# Patient Record
Sex: Female | Born: 1955 | Race: White | Hispanic: No | Marital: Married | Smoking: Current some day smoker
Health system: Southern US, Community
[De-identification: ages and names within clinical notes are randomized; demographics above are authoritative.]

## PROBLEM LIST (undated history)

## (undated) DIAGNOSIS — R011 Cardiac murmur, unspecified: Secondary | ICD-10-CM

## (undated) DIAGNOSIS — E119 Type 2 diabetes mellitus without complications: Secondary | ICD-10-CM

## (undated) DIAGNOSIS — G4730 Sleep apnea, unspecified: Secondary | ICD-10-CM

## (undated) DIAGNOSIS — M549 Dorsalgia, unspecified: Secondary | ICD-10-CM

## (undated) HISTORY — DX: Sleep apnea, unspecified: G47.30

## (undated) HISTORY — DX: Cardiac murmur, unspecified: R01.1

## (undated) HISTORY — DX: Dorsalgia, unspecified: M54.9

## (undated) HISTORY — DX: Type 2 diabetes mellitus without complications: E11.9

---

## 2000-08-22 ENCOUNTER — Encounter: Admission: RE | Admit: 2000-08-22 | Discharge: 2000-08-22 | Payer: Self-pay | Admitting: Occupational Medicine

## 2000-08-22 ENCOUNTER — Encounter: Payer: Self-pay | Admitting: Occupational Medicine

## 2002-07-29 ENCOUNTER — Other Ambulatory Visit: Admission: RE | Admit: 2002-07-29 | Discharge: 2002-07-29 | Payer: Self-pay | Admitting: *Deleted

## 2002-08-20 ENCOUNTER — Ambulatory Visit (HOSPITAL_COMMUNITY): Admission: RE | Admit: 2002-08-20 | Discharge: 2002-08-20 | Payer: Self-pay | Admitting: *Deleted

## 2002-08-20 ENCOUNTER — Encounter (INDEPENDENT_AMBULATORY_CARE_PROVIDER_SITE_OTHER): Payer: Self-pay

## 2002-09-09 ENCOUNTER — Emergency Department (HOSPITAL_COMMUNITY): Admission: EM | Admit: 2002-09-09 | Discharge: 2002-09-09 | Payer: Self-pay | Admitting: Emergency Medicine

## 2002-09-09 ENCOUNTER — Encounter: Payer: Self-pay | Admitting: Emergency Medicine

## 2002-09-10 ENCOUNTER — Encounter: Payer: Self-pay | Admitting: Specialist

## 2002-09-10 ENCOUNTER — Ambulatory Visit (HOSPITAL_COMMUNITY): Admission: RE | Admit: 2002-09-10 | Discharge: 2002-09-10 | Payer: Self-pay | Admitting: Specialist

## 2003-09-07 ENCOUNTER — Other Ambulatory Visit: Admission: RE | Admit: 2003-09-07 | Discharge: 2003-09-07 | Payer: Self-pay | Admitting: *Deleted

## 2004-08-31 ENCOUNTER — Ambulatory Visit (HOSPITAL_BASED_OUTPATIENT_CLINIC_OR_DEPARTMENT_OTHER): Admission: RE | Admit: 2004-08-31 | Discharge: 2004-08-31 | Payer: Self-pay | Admitting: *Deleted

## 2008-03-07 ENCOUNTER — Emergency Department (HOSPITAL_COMMUNITY): Admission: EM | Admit: 2008-03-07 | Discharge: 2008-03-07 | Payer: Self-pay | Admitting: Emergency Medicine

## 2009-06-09 ENCOUNTER — Encounter: Admission: RE | Admit: 2009-06-09 | Discharge: 2009-06-09 | Payer: Self-pay | Admitting: Neurological Surgery

## 2011-04-12 NOTE — Procedures (Signed)
NAME:  Meagan Moran, Meagan Moran               ACCOUNT NO.:  [id number]   MEDICAL RECORD NO.:  [id number]          PATIENT TYPE:  OUT   LOCATION:  SLEEP CENTER                 FACILITY:  [facility]   PHYSICIAN:  Clinton D. Maple Hudson, M.D. DATE OF BIRTH:  September 11, 1956   DATE OF STUDY:  08/31/2004                              NOCTURNAL POLYSOMNOGRAM   STUDY DATE:  August 31, 2004   REFERRING PHYSICIAN:  Ronnette Juniper. Roseanne Reno, M.D.   INDICATION FOR STUDY:  Hypersomnia with sleep apnea.   EPWORTH SLEEPINESS SCORE:  7/24   NECK SIZE:  17 inches   BODY MASS INDEX:  35   WEIGHT:  260 pounds   MEDICATIONS:  Hyzaar, glipizide, Singulair, Protonix, Norvasc, tramadol,  Xanax, Zoloft, hydrocodone or Percocet, tizanidine.   SLEEP ARCHITECTURE:  Total sleep time 404 minutes with sleep efficiency 84%,  stage I was 11%, stage II was 73%, stages III and IV were absent, REM was  15% of total sleep time, latency to sleep onset 28 minutes, latency to REM  259 minutes, awake after sleep onset 38 minutes, arousal index 27 which was  increased and reflected respiratory events, leg movements, and a significant  number of nonspecific arousals.   RESPIRATORY DATA:  NPSG protocol.  RDI 23.6/hr indicating moderately severe  obstructive sleep apnea/hypopnea syndrome.  This reflected 34 obstructive  apneas, 125 hypopneas.  Events were not positional.  REM RDI was 61/hr.   OXYGEN DATA:  Loud snoring with oxygen desaturation during apneas to a nadir  of 83%.  Mean oxygen saturation through the study was 94% on room air.   CARDIAC DATA:  Normal cardiac rhythm.   MOVEMENT/PARASOMNIA:  Bathroom visit x1.  A total of 44 limb jerks were  recorded of which 11 were associated with arousal or awakening for a  periodic limb movement with arousal index of 1.3/hr which is probably not  clinically significant.   IMPRESSION/RECOMMENDATION:  Moderate obstructive sleep apnea/hypopnea  syndrome with respiratory disturbance index 23.6/hr and  oxygen desaturation  to 83%.  Consider return for continuous positive airway pressure titration  or evaluation for alternative therapies.      CDY/MEDQ  D:  09/09/2004 09:16:12  T:  09/09/2004 18:14:08  Job:  [id number]

## 2011-04-12 NOTE — Op Note (Signed)
   NAME:  Meagan Moran, BOEHNING                         ACCOUNT NO.:  [id number]   MEDICAL RECORD NO.:  [id number]                   PATIENT TYPE:  AMB   LOCATION:  SDC                                  FACILITY:  [facility]   PHYSICIAN:  Georgina Peer, M.D.              DATE OF BIRTH:  22-Jun-1956   DATE OF PROCEDURE:  08/20/2002  DATE OF DISCHARGE:                                 OPERATIVE REPORT   PREOPERATIVE DIAGNOSES:  Menorrhagia.   POSTOPERATIVE DIAGNOSES:  Menorrhagia.   OPERATION PERFORMED:  Hysteroscopy, dilatation and curettage.   SURGEON:  Georgina Peer, M.D.   ANESTHESIA:  Monitored anesthesia care using IV sedation and 15 cc  paracervical block using 1% Xylocaine plain.   ESTIMATED BLOOD LOSS:  25 cc.   COMPLICATIONS:  None.   FINDINGS:  Thickened endometrium.  No intracavitary mass or lesions.   INDICATIONS:  A 55 year old female with heavy vaginal bleeding for the last  three weeks, suggestion of a filling defect on saline infusion sonogram with  heavy bleeding.  The patient was brought in for hysteroscopy and possible  resectoscope, possible D&C.   DESCRIPTION:  The patient was taken to the operating room, given IV  sedation, placed in the dorsal lithotomy position.  Perineum, vagina,  periurethral area prepped and draped sterilely.  A catheter was used to  empty the bladder.  Cervix was visualized with a speculum and a paracervical  block using 15 cc of 1% Xylocaine plain was injected and infiltrated into  the cervix.  A tenaculum grasped the anterior lip of the cervix.  The cervix  was sounded to 8 cm and progressively dilated to 23 [place].  A diagnostic  hysteroscope was used to visualize the cervix and endocervix which was  normal.  There was lush endometrium with prominent vasculature in the  endometrial cavity.  There were no filling defects, polyps, fibroids, or  synechiae noted.  A sharp curettage was performed and the tissue sent to  pathology.   Hysteroscopic visualization after this curettage revealed a much  cleaner endometrial cavity with the vasculature being removed.  Tissue was  sent for pathology.  Sponge, needle, and instrument counts were correct.  The patient received Toradol IV before returning to recovery area.                                               Georgina Peer, M.D.    JPN/MEDQ  D:  08/20/2002  T:  08/20/2002  Job:  [id number]

## 2014-02-01 ENCOUNTER — Other Ambulatory Visit (HOSPITAL_COMMUNITY): Payer: Self-pay | Admitting: Emergency Medicine

## 2014-02-01 ENCOUNTER — Ambulatory Visit (HOSPITAL_COMMUNITY)
Admission: RE | Admit: 2014-02-01 | Discharge: 2014-02-01 | Disposition: A | Discharge status: Home / Self Care | Payer: BC Managed Care – PPO | Source: Ambulatory Visit | Attending: Emergency Medicine | Admitting: Emergency Medicine

## 2014-02-01 DIAGNOSIS — R52 Pain, unspecified: Secondary | ICD-10-CM

## 2014-02-01 DIAGNOSIS — Z975 Presence of (intrauterine) contraceptive device: Secondary | ICD-10-CM | POA: Insufficient documentation

## 2014-02-01 DIAGNOSIS — M4120 Other idiopathic scoliosis, site unspecified: Secondary | ICD-10-CM | POA: Insufficient documentation

## 2014-02-01 DIAGNOSIS — IMO0001 Reserved for inherently not codable concepts without codable children: Secondary | ICD-10-CM | POA: Insufficient documentation

## 2014-02-01 DIAGNOSIS — M51379 Other intervertebral disc degeneration, lumbosacral region without mention of lumbar back pain or lower extremity pain: Secondary | ICD-10-CM | POA: Insufficient documentation

## 2014-02-01 DIAGNOSIS — M79609 Pain in unspecified limb: Secondary | ICD-10-CM | POA: Insufficient documentation

## 2014-02-01 DIAGNOSIS — M545 Low back pain, unspecified: Secondary | ICD-10-CM | POA: Insufficient documentation

## 2014-02-01 DIAGNOSIS — W19XXXA Unspecified fall, initial encounter: Secondary | ICD-10-CM | POA: Insufficient documentation

## 2014-02-01 DIAGNOSIS — M5137 Other intervertebral disc degeneration, lumbosacral region: Secondary | ICD-10-CM | POA: Insufficient documentation

## 2016-08-21 ENCOUNTER — Other Ambulatory Visit: Payer: Self-pay | Admitting: Family Medicine

## 2016-08-21 ENCOUNTER — Ambulatory Visit
Admission: RE | Admit: 2016-08-21 | Discharge: 2016-08-21 | Disposition: A | Discharge status: Home / Self Care | Payer: BC Managed Care – PPO | Source: Ambulatory Visit | Attending: Family Medicine | Admitting: Family Medicine

## 2016-08-21 DIAGNOSIS — Z01818 Encounter for other preprocedural examination: Secondary | ICD-10-CM

## 2019-06-22 ENCOUNTER — Encounter: Payer: Self-pay | Admitting: Psychiatry

## 2019-06-22 ENCOUNTER — Other Ambulatory Visit: Payer: Self-pay

## 2019-06-22 ENCOUNTER — Ambulatory Visit (INDEPENDENT_AMBULATORY_CARE_PROVIDER_SITE_OTHER): Payer: BC Managed Care – PPO | Admitting: Psychiatry

## 2019-06-22 VITALS — BP 180/100 | HR 78 | Ht 74.0 in | Wt 260.0 lb

## 2019-06-22 DIAGNOSIS — F39 Unspecified mood [affective] disorder: Secondary | ICD-10-CM

## 2019-06-22 DIAGNOSIS — F411 Generalized anxiety disorder: Secondary | ICD-10-CM | POA: Diagnosis not present

## 2019-06-22 MED ORDER — OXCARBAZEPINE 300 MG PO TABS: 300.0000 mg | ORAL_TABLET | Freq: Every day | ORAL | 1 refills | Status: DC

## 2019-06-22 MED ORDER — DULOXETINE HCL 30 MG PO CPEP: ORAL_CAPSULE | ORAL | 0 refills | Status: DC

## 2019-06-22 MED ORDER — DULOXETINE HCL 60 MG PO CPEP: 60.0000 mg | ORAL_CAPSULE | Freq: Every day | ORAL | 0 refills | Status: DC

## 2019-06-22 NOTE — Progress Notes (Signed)
[facility] MD/PA/NP Initial Note  06/24/2019 1:14 PM Meagan Moran Viele  MRN:  [id number]  Chief Complaint:  Chief Complaint    Anxiety; Depression      HPI: Pt is a 63 yo female seen for initial evaluation for anxiety. She is accompanied by her husband who is present for the initial portion of the exam, leaves during the exam at the request of patient, and is called back in by pt for conclusion of exam. Pt reports that she has seen Meagan MinksLeslie Brewington, NP in the past for psychiatric medication management and they would like to transfer her care to this practice She reports that she would also like to see a therapist in this practice since her previous therapist, Meagan Moran, retired.  Patient reports that she has agreed to seek treatment at the insistence of her husband since she feels this would be beneficial/necessary with maintaining their marriage.  Husband reports that pt has severe anxiety and is having confusion. He reports that she is having difficulty functioning and is not cooking and doing her usual acitvities and "literally walks in circles." Husband reports that her thought process is disorganized at times.   Patient reports that she feels the best in the morning. She reports that her anxiety worsens as the day progresses. She reports that she has frequent worry, rumination, and catastrophic thinking. Notices GI s/s with anxiety. Denies any h/o panic attacks. Denies social anxiety. Reports some checking behaviors. Denies rituals or routines. Reports obsessive thoughts about finances. Husband reports that pt frequently obsesses about where the money will come from or if they will have enough money to cover things  Describes mood as "a little depressed, aggravated, angry." Reports that she has lost close to 50 lbs in the last few months. Her husband reports that pt barely eats. She reports that is is difficult for her to chew due to poor dentition. Appetite is low. Reports adequate sleep  and estimates sleeping 6-7 hours a night. Husband reports that she naps 3-4 hours daily. Reports that her energy is better in the morning. Motivation is good. She reports poor concentration. They both report that she has difficulty completing tasks and is distractible. Denies SI. Marland Kitchen. She reports that her thoughts will race. They deny any impulsivity.   Reports that her mood may have been elevated at times in the past when her children were younger and still at home. She reports 2-3 day periods of 3-4 hours of sleep a night. Reports that she had racing thoughts then. 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   Denies AH, VH, or paranoia.  Past Psychiatric Medication Trials: Cymbalta- Effective for anxiety at 60 mg po qd.  Prozac- side effects Wellbutrin- side effects Trileptal- Effective. Caused nightmares. Was taking 300 mg po BID.   Visit Diagnosis:    ICD-10-CM   1. Generalized anxiety disorder  F41.1 DULoxetine (CYMBALTA) 30 MG capsule    Oxcarbazepine (TRILEPTAL) 300 MG tablet    DULoxetine (CYMBALTA) 60 MG capsule  2. Episodic mood disorder (HCC)  F39 Oxcarbazepine (TRILEPTAL) 300 MG tablet    Past Psychiatric History: Saw Meagan MinksLeslie Brewington, NP in the past and Meagan Moran for therapy until she retired. Denies any other past psychiatric tx to include hospitalization.   Past Medical History:  Past Medical History:  Diagnosis Date  . Back pain   . Diabetes mellitus, type II (HCC)   . Heart murmur   .  Sleep apnea     Past Surgical History:  Procedure Laterality Date  . CESAREAN SECTION      Family History:  Family History  Problem Relation Age of Onset  . Anxiety disorder Mother   . Anxiety disorder Maternal Grandmother   . Anxiety disorder Daughter     Social History:  [place]. 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 Reports that she  enjoyed her work overall and there were some work stressors. Husband reports that pt was pushed into early retirement for not meeting productivity expectations. Enjoys working in her garden. Married x 37 years and reports that they are having marital stress. Husband is disabled. Daughter is in the [employer] and is at [employer]. Daughter is married to a woman. [place] in [place] who works from home and had 4 children with one woman. Reports son is now in another relationship. Father died in 2012. Has always enjoyed cooking. Reports increased marital conflict for the last 9 months. Reports that husband is currently controlling finances.  Patient indicates that her husband also controls which medications she does and does not take and this affects compliance with prescribed insulin regimen. Reports that her children are supportive.    Social History   Socioeconomic History  . Marital status: Married    Spouse name: Not on file  . Number of children: Not on file  . Years of education: Not on file  . Highest education level: Not on file  Occupational History  . Not on file  Social Needs  . Financial resource strain: Not on file  . Food insecurity    Worry: Not on file    Inability: Not on file  . Transportation needs    Medical: Not on file    Non-medical: Not on file  Tobacco Use  . Smoking status: Current Every Day Smoker  . Smokeless tobacco: Never Used  Substance and Sexual Activity  . Alcohol use: Not Currently  . Drug use: Not Currently  . Sexual activity: Not on file  Lifestyle  . Physical activity    Days per week: Not on file    Minutes per session: Not on file  . Stress: Not on file  Relationships  . Social Musicianconnections    Talks on phone: Not on file    Gets together: Not on file    Attends religious service: Not on file    Active member of club or organization: Not on file    Attends meetings of clubs or organizations: Not on file    Relationship status: Not on file   Other Topics Concern  . Not on file  Social History Narrative  . Not on file    Allergies:  Allergies  Allergen Reactions  . Nsaids   . Sulfa Antibiotics     Metabolic Disorder Labs: No results found for: HGBA1C, MPG No results found for: PROLACTIN No results found for: CHOL, TRIG, HDL, CHOLHDL, VLDL, LDLCALC No results found for: TSH  Therapeutic Level Labs: No results found for: LITHIUM No results found for: VALPROATE No components found for:  CBMZ  Current Medications: Current Outpatient Medications  Medication Sig Dispense Refill  . ALPRAZolam (XANAX) 0.5 MG tablet TAKE 1 TABLET EVERY 12 HOURS AS NEEDED FOR ANXIETY    . amLODipine (NORVASC) 5 MG tablet TAKE 1 TABLET EVERY DAY AT LUNCH    . hydrochlorothiazide (MICROZIDE) 12.5 MG capsule Take 12.5 mg  by mouth daily as needed.    Marland Kitchen. HYDROcodone-acetaminophen (NORCO) 7.5-325 MG tablet     . losartan (COZAAR) 100 MG tablet Take 100 mg by mouth daily.    . Oxcarbazepine (TRILEPTAL) 300 MG tablet Take 1 tablet (300 mg total) by mouth at bedtime. 30 tablet 1  . tiZANidine (ZANAFLEX) 4 MG tablet TAKE 1 TABLET 4 TIMES A DAY AS NEEDED FOR MUSCLE SPASMS    . DULoxetine (CYMBALTA) 30 MG capsule Take 1 capsule po q am x 1 week, then 2 capsules po q am 30 capsule 0  . [START ON 07/06/2019] DULoxetine (CYMBALTA) 60 MG capsule Take 1 capsule (60 mg total) by mouth daily. 30 capsule 0   No current facility-administered medications for this visit.     Medication Side Effects: none  Orders placed this visit:  No orders of the defined types were placed in this encounter.   Psychiatric Specialty Exam:  Review of Systems  Constitutional: Positive for malaise/fatigue and weight loss.  HENT: Positive for tinnitus.   Eyes: Negative.   Respiratory: Positive for wheezing.   Cardiovascular: Negative.   Gastrointestinal: Positive for constipation, diarrhea and nausea.  Genitourinary: Positive for dysuria, frequency and urgency.   Musculoskeletal: Positive for back pain and joint pain.  Skin: Positive for itching and rash.  Neurological: Positive for dizziness, tingling, weakness and headaches.  Endo/Heme/Allergies: Positive for polydipsia.  Psychiatric/Behavioral:       Refer to HPI    Blood pressure (!) 180/100, pulse 78, height 6\' 2"  (1.88 m), weight 260 lb (117.9 kg).Body mass index is 33.38 kg/m.  General Appearance: Casual  Eye Contact:  Fair  Speech:  Talkative  Volume:  Normal  Mood:  Anxious and Dysphoric  Affect:  Anxious  Thought Process:  Descriptions of Associations: Circumstantial  Orientation:  Full (Time, Place, and Person)  Thought Content: Rumination   Suicidal Thoughts:  No  Homicidal Thoughts:  No  Memory:  Recent;   Fair  Judgement:  Fair  Insight:  Fair  Psychomotor Activity:  Normal  Concentration:  Concentration: Poor  Recall:  Fair  Fund of Knowledge: Good  Language: Good  Assets:  Communication Skills Resilience Vocational/Educational  ADL's:  Intact  Cognition: WNL  Prognosis:  Fair   Pt presents as disengaged when husband is present and more organized and engaged when husband is not present.   Receiving Psychotherapy: No   Treatment Plan/Recommendations: Patient seen for 60 minutes and greater than 50% of visit spent counseling patient and her husband and coordination of care, to include requesting records from previous psychiatric provider and assisting patient with scheduling appointment to see Jim DesanctisAndy Mitcham, PhD.  Also counseled patient and her husband regarding current anxiety signs and symptoms and potential benefits, risks, and side effects of possible treatment options.  Discussed resuming Cymbalta since patient reports that this has been helpful for her mood and anxiety in the past.  Discussed that Cymbalta may also be helpful for chronic pain.  Will restart Cymbalta 30 mg p.o. every morning for 1 week, then increase to 60 mg p.o. every morning for anxiety and mood.   Patient also reports that Trileptal has been helpful for her mood and anxiety.  Husband reports concerns that patient seem to have some cognitive side effects when taken during the day and that higher doses have been associated with nightmares.  Discussed taking 300 mg of Trileptal at at bedtime only to determine if patient can achieve efficacy at this dose without significant tolerability  issues.  Discussed that Trileptal may also improve racing thoughts.  Discussed potential benefits of therapy and patient is amenable to individual therapy and husband reports that he would consider marital counseling if needed.  Patient to follow-up with this provider in 4 to 6 weeks or sooner if clinically indicated. Patient advised to contact office with any questions, adverse effects, or acute worsening in signs and symptoms.     Thayer Headings, PMHNP

## 2019-06-24 ENCOUNTER — Other Ambulatory Visit: Payer: Self-pay | Admitting: Neurosurgery

## 2019-06-24 DIAGNOSIS — M5136 Other intervertebral disc degeneration, lumbar region: Secondary | ICD-10-CM

## 2019-06-28 ENCOUNTER — Ambulatory Visit (INDEPENDENT_AMBULATORY_CARE_PROVIDER_SITE_OTHER): Payer: BC Managed Care – PPO | Admitting: Psychiatry

## 2019-06-28 ENCOUNTER — Other Ambulatory Visit: Payer: Self-pay

## 2019-06-28 DIAGNOSIS — F322 Major depressive disorder, single episode, severe without psychotic features: Secondary | ICD-10-CM

## 2019-06-28 DIAGNOSIS — G4700 Insomnia, unspecified: Secondary | ICD-10-CM | POA: Diagnosis not present

## 2019-06-28 DIAGNOSIS — F411 Generalized anxiety disorder: Secondary | ICD-10-CM | POA: Diagnosis not present

## 2019-06-28 DIAGNOSIS — Z630 Problems in relationship with spouse or partner: Secondary | ICD-10-CM | POA: Diagnosis not present
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
## 2019-06-30 ENCOUNTER — Other Ambulatory Visit: Payer: Self-pay | Admitting: Psychiatry

## 2019-06-30 DIAGNOSIS — F411 Generalized anxiety disorder: Secondary | ICD-10-CM

## 2019-06-30 NOTE — Telephone Encounter (Signed)
Call pharmacy

## 2019-07-01 NOTE — Telephone Encounter (Signed)
Meagan Moran, can you call pharmacy to verify what her last refill was for? After the 30 mg she is to move up to 60 mg.

## 2019-07-06 ENCOUNTER — Other Ambulatory Visit: Payer: Self-pay

## 2019-07-06 ENCOUNTER — Ambulatory Visit (INDEPENDENT_AMBULATORY_CARE_PROVIDER_SITE_OTHER): Payer: Self-pay | Admitting: Psychiatry

## 2019-07-06 DIAGNOSIS — F411 Generalized anxiety disorder: Secondary | ICD-10-CM

## 2019-07-06 DIAGNOSIS — F322 Major depressive disorder, single episode, severe without psychotic features: Secondary | ICD-10-CM

## 2019-07-09 ENCOUNTER — Other Ambulatory Visit: Payer: Self-pay

## 2019-07-09 ENCOUNTER — Ambulatory Visit (INDEPENDENT_AMBULATORY_CARE_PROVIDER_SITE_OTHER): Payer: BC Managed Care – PPO | Admitting: Psychiatry

## 2019-07-09 DIAGNOSIS — G4733 Obstructive sleep apnea (adult) (pediatric): Secondary | ICD-10-CM | POA: Diagnosis not present

## 2019-07-09 DIAGNOSIS — F39 Unspecified mood [affective] disorder: Secondary | ICD-10-CM | POA: Diagnosis not present

## 2019-07-09 DIAGNOSIS — F411 Generalized anxiety disorder: Secondary | ICD-10-CM

## 2019-07-09 NOTE — Progress Notes (Signed)
Psychotherapy Progress Note [facility] [name], PhD LP  Patient ID: Meagan Moran     MRN: [id number]     Therapy format: Individual psychotherapy Date: 07/09/2019     Start: 8:14a Stop: 9:02a Time Spent: 48 min Location: telehealth   Telehealth visit -- I connected with this patient by an approved telecommunication method (audio only), with her informed consent, and verifying identity and patient privacy.  I was located at my office and patient at her home.  As needed, we discussed the limitations, risks, and security and privacy concerns associated with telehealth service, including the availability and conditions which currently govern in-person appointments and the possibility that 3rd-party payment may not be fully guaranteed and she may be responsible for charges.  After she indicated understanding, we proceeded with the session.  Also discussed treatment planning, as needed, including ongoing verbal agreement with the plan, the opportunity to ask and answer all questions, her demonstrated understanding of instructions, and her readiness to call the office should symptoms worsen or she feels she is in a crisis state and needs more immediate and tangible assistance.  Session narrative (presenting needs, interim history, self-report of stressors and symptoms, applications of prior therapy, status changes, and interventions made in session) C/o feeling fatigable, variable DFA, AM digestive distress.  Probed sleep status -- uses CPAP, believes it to be clean and well-adjusted, but has not had consultation for a long time.  Uses Colace, walking for constipation sxs but anxious about using too much Colace.  Assured stool softener should be plenty safe.  Acknowledges some use of hydrocodone/APAP QAM, sometimes, no more than twice a day, never over her allotment.  Possibility opioid slows bowel.  Also on tizanidine for muscle spasms.  Discussed measures she can take for  regularity.  Interpreted need to safeguard her sleep -- can be bothered by phone messages and certainly by worry and rumination once quiet.  Continues to feel lost, as the former breadwinner who lost her job.  Admits great anger at herself, ostensibly for failing.  Challenged view of failure, reframed as experiencing the cumulative effects of hard-driving a long time, trying not to stop for her body's needs, committing herself rather mercilessly to being strong for [name] and doing everything.  Admits internally yelling at herself.  Pointed out particular need for humane self-talk and challenged to notice the double standard between talking to herself and talking to any loved one, e.g., daughter.  Tearfully acknowledges the problem.  Daughter (Meagan Moran), captain in [place], just had hip surgery, has a wife of her own taking care of her, and out of commission herself for some time in recovery.  She gets bitter sometimes, similarly.  Challenged PT to consider what she would, and does, tell Meagan Moran.  Modeled and coached in humane self-talk, articulating "the reverse golden rule" of being as good to oneself as  You already know how to be to others.  Discussed tactics for recognizing and rewriting harsh automatic thoughts.  Refreshed/supported in understanding also that her husband's gruff ways credibly cover a strong and soft love for her, that his temptation to irritability is more than anything about him trying to fight off her internal harsh voice without seeming to fight PT herself.  Assured he is settling, and warming, and that their discrepant tastes for climate at night are the main obstacle to sharing a bedroom.  Therapeutic modalities: Cognitive Behavioral Therapy, Solution-Oriented/Positive Psychology and Meagan Moran-senstive  Mental Status/Observations:  Appearance:   Not assessed  Behavior:  Appropriate  Motor:  Not assessed  Speech/Language:   Clear and Coherent  Affect:  Not assessed, tearful per audio   Mood:  anxious, dysthymic and irritable  Thought process:  normal  Thought content:    Rumination  Sensory/Perceptual disturbances:    WNL  Orientation:  grossly intact  Attention:  Good  Concentration:  Good  Memory:  grossly intact  Insight:    Fair  Judgment:   Fair  Impulse Control:  Fair   Risk Assessment: Danger to Self: No Self-injurious Behavior: No Danger to Others: No Physical Aggression / Violence: No Duty to Warn: No Access to Firearms a concern: No  Assessment of progress:  progressing  Diagnosis:    ICD-10-CM   1. Generalized anxiety disorder  F41.1   2. Episodic mood disorder (HCC)  F39   3. Obstructive sleep apnea syndrome  G47.33    Plan:  . Continue using walking and/or calming breathing ad lib for anxiety . Apply "reverse golden rule" ad lib . For awareness and constructive self-talk -- journal at least 2/day what's going on, how she feels, and any self-talk she can identify; as able, rewrite the negatives, using the key to encourage herself the way she would her daughter . For sleep -- practice full, honest, conscious permission to let down and sleep well for the night.  Resolve no demand to get up unless it requires CPR or a Government social research officerfire extinguisher.  Consider putting phone on DND setting during the night. . For digestion -- resolved to get yogurt with active cultures, uphold water and fiber intake, walk/stretch as able . Other recommendations/advice as noted above . Continue to utilize previously learned skills ad lib . Maintain medication as prescribed and work faithfully with relevant prescriber(s) if any changes are desired or seem indicated . Call the clinic on-call service, present to ER, or call 911 if any life-threatening psychiatric crisis Return in about 1 week (around 07/16/2019).  Robley Friesobert Terris Bodin, PhD Marliss CzarAndy Joleena Weisenburger, PhD LP Clinical Psychologist, [facility] [address], [address] [place], [place] [address] [phone](o) [phone]

## 2019-07-10 ENCOUNTER — Ambulatory Visit
Admission: RE | Admit: 2019-07-10 | Discharge: 2019-07-10 | Disposition: A | Discharge status: Home / Self Care | Payer: BC Managed Care – PPO | Source: Ambulatory Visit | Attending: Neurosurgery | Admitting: Neurosurgery

## 2019-07-10 ENCOUNTER — Other Ambulatory Visit: Payer: Self-pay

## 2019-07-10 DIAGNOSIS — M5136 Other intervertebral disc degeneration, lumbar region: Secondary | ICD-10-CM

## 2019-07-13 ENCOUNTER — Other Ambulatory Visit: Payer: Self-pay

## 2019-07-13 ENCOUNTER — Ambulatory Visit (INDEPENDENT_AMBULATORY_CARE_PROVIDER_SITE_OTHER): Payer: BC Managed Care – PPO | Admitting: Psychiatry

## 2019-07-13 DIAGNOSIS — G4733 Obstructive sleep apnea (adult) (pediatric): Secondary | ICD-10-CM | POA: Diagnosis not present

## 2019-07-13 DIAGNOSIS — F322 Major depressive disorder, single episode, severe without psychotic features: Secondary | ICD-10-CM | POA: Diagnosis not present

## 2019-07-13 DIAGNOSIS — Z630 Problems in relationship with spouse or partner: Secondary | ICD-10-CM

## 2019-07-13 DIAGNOSIS — F411 Generalized anxiety disorder: Secondary | ICD-10-CM | POA: Diagnosis not present

## 2019-07-13 NOTE — Progress Notes (Signed)
Psychotherapy Progress Note [facility] [name], PhD LP  Patient ID: Meagan Moran     MRN: [id number]     Therapy format: Individual psychotherapy Date: 07/13/2019     Start: 8:18a Stop: 9:03a Time Spent: 45 min Location: telehealth   Telehealth visit -- I connected with this patient by an approved telecommunication method (audio only), with her informed consent, and verifying identity and patient privacy.  I was located at my office and patient at her home.  As needed, we discussed the limitations, risks, and security and privacy concerns associated with telehealth service, including the availability and conditions which currently govern in-person appointments and the possibility that 3rd-party payment may not be fully guaranteed and she may be responsible for charges.  After she indicated understanding, we proceeded with the session.  Also discussed treatment planning, as needed, including ongoing verbal agreement with the plan, the opportunity to ask and answer all questions, her demonstrated understanding of instructions, and her readiness to call the office should symptoms worsen or she feels she is in a crisis state and needs more immediate and tangible assistance.  Session narrative (presenting needs, interim history, self-report of stressors and symptoms, applications of prior therapy, status changes, and interventions made in session) Husband moved back in the bedroom, then back out.  Feels guilty, fears she is pushing him to divorce.  Says either he or she have torn up her journal notes because she is "doing them wrong".  Guilty all over the place, screwing up food prep, apparently trying to do everything perfectly and failing, mercilessly second-guessing and fearing disapproval/disappointment.    Notes her blood sugars are not controlled, evidence being that they go as low as 64, as high as 150-160.  Assured that a number of diabetics have told me about periods in  the 200s without their doctors being alarmed, per se, main risk sounds to be too-low b.s. precipitating anxiety, foggyheadedness, or LOC if worse.  Plans to switch endocrinologists so she can see a doctor  in-person.  Usual regimen includes PRN insulin if it goes high, but she believes [name] tells her not to take it when sugars are high.  Seems to be some confusion about what is sound advice, possible overinterpretation or lack of explanation between PT and husband.  Noted that poorly controlled b.s. will aggravate emotional sxs and discussed practical diabetes control, if jittery and registers low b.s., a little juice and see how she feels in 10 minutes.  Has cranberry juice on hand in 7 oz. bottles but inconveniently diuretic.    Stomach upset (bowel retention) daily.  MRI lower back, awaiting results, worries.   Is taking meds as directed, no NMs, better quality of sleep. Using CPAP.  Recommendations made and written down at home by PT (below), including prayer forms consistent with her faith.  PT says she does not have decent supply of paper at home but feels guilty buying a journal because of money.  Note made on envelopes handy and reportedly put on refrigerator with request for [name] to wake her when he get up.  PT intends to come in-person next visit -- better quality of interaction and healthy change of scenery from home-based worry/rumination habits.  H remains welcome.  Therapeutic modalities: Cognitive Behavioral Therapy, Solution-Oriented/Positive Psychology, Ego-Supportive and Psycho-education/Bibliotherapy  Mental Status/Observations:  Appearance:   Not assessed     Behavior:  Agitated  Motor:  Not assessed  Speech/Language:   Clear and Coherent  Affect:  Not assessed  Mood:  anxious and depressed  Thought process:  flight of ideas and somewhat  Thought content:    multiple worries  Sensory/Perceptual disturbances:    WNL  Orientation:  grossly intact  Attention:  Good   Concentration:  Fair  Memory:  grossly intact, not assessed  Insight:    Fair  Judgment:   Fair  Impulse Control:  Fair   Risk Assessment: Danger to Self: No Self-injurious Behavior: No Danger to Others: No Physical Aggression / Violence: No Duty to Warn: No Access to Firearms a concern: No  Assessment of progress:  progressing  Diagnosis:   ICD-10-CM   1. Generalized anxiety disorder  F41.1   2. Current severe episode of major depressive disorder without psychotic features, unspecified whether recurrent (HCC)  F32.2   3. Obstructive sleep apnea syndrome  G47.33   4. Relationship problem between partners  Z63.0    Plan:  . OK to use stool softener -- ask medical advice PRN . If b.s. low, OK to eat a little something and give it 10 minutes -- brain may need the fuel, and shaky/foggy feelings are signs of low b.s., not personal failure . When you catch yourself worrying, pray for the positives (e.g., Lord, help me trust you have this) more than the negatives (e.g., stop this) . Don't settle for assumptions that Jillyn HiddenGary is angry/scolding -- check perceptions, specifically, "Is that what you mean, or is it my depression talking to me?" (slows down negative conclusion-jumping, gives husband the chance to re-record if he is losing temper) . Continue to apply the "Faith" standard in self-talk (how she would talk to her daughter about the very same thing) . Self-remind as needed that Levi Straussary rooming separately had much more to do with preferred sleeping conditions, not personal disapproval . Other recommendations/advice as noted above . Continue to utilize previously learned skills ad lib . Maintain medication as prescribed and work faithfully with relevant prescriber(s) if any changes are desired or seem indicated . Call the clinic on-call service, present to ER, or call 911 if any life-threatening psychiatric crisis Return in about 3 days (around 07/16/2019) for in-person.  Robley Friesobert Francessca Friis,  PhD Marliss CzarAndy Arabell Neria, PhD LP Clinical Psychologist, [facility] [address], [address] [place], [place] [address] [phone](o) [phone]

## 2019-07-15 ENCOUNTER — Other Ambulatory Visit: Payer: Self-pay | Admitting: Psychiatry

## 2019-07-15 DIAGNOSIS — F411 Generalized anxiety disorder: Secondary | ICD-10-CM

## 2019-07-16 ENCOUNTER — Ambulatory Visit (INDEPENDENT_AMBULATORY_CARE_PROVIDER_SITE_OTHER): Payer: BC Managed Care – PPO | Admitting: Psychiatry

## 2019-07-16 ENCOUNTER — Other Ambulatory Visit: Payer: Self-pay

## 2019-07-16 DIAGNOSIS — F411 Generalized anxiety disorder: Secondary | ICD-10-CM

## 2019-07-16 DIAGNOSIS — Z630 Problems in relationship with spouse or partner: Secondary | ICD-10-CM

## 2019-07-16 DIAGNOSIS — G8929 Other chronic pain: Secondary | ICD-10-CM

## 2019-07-16 DIAGNOSIS — F322 Major depressive disorder, single episode, severe without psychotic features: Secondary | ICD-10-CM | POA: Diagnosis not present

## 2019-07-16 DIAGNOSIS — G4733 Obstructive sleep apnea (adult) (pediatric): Secondary | ICD-10-CM

## 2019-07-16 NOTE — Progress Notes (Signed)
Psychotherapy Progress Note [facility] [name], PhD LP  Patient ID: Meagan Moran     MRN: [id number]     Therapy format: Individual psychotherapy Date: 07/16/2019     Start: 8:15a Stop: 9:05a Time Spent: 50 min Location: in-person   Session narrative (presenting needs, interim history, self-report of stressors and symptoms, applications of prior therapy, status changes, and interventions made in session) Still restless, bothered by her mental state.  Has been journaling, has questions from her husband.  Crying jags, confusion, worry about money, "I screwed up" cooking (burned a dish, while alone, Meagan Moran out at [employer]), tried to relax.  Meagan Moran got home, needed help mowing, trouble starting the mower, PT broke down, an argument ensued as she got clingy and self-critical, and husband angrily asked "When are we gonna see some results?!"  Took time in session to lay out what is going on with PT's symptoms, why not to expect a quick recovery (she broke in November, only actually working on it for 3 weeks now), and what she most needs psychologically and behaviorally to recover.  Interpreted that we're dealing with a stress injury to her brain, one that took years to set up with hard-driving lifestyle and work-style and years of coping with chronic pain, before the catastrophic job loss in November, which traumatically broke her working self-image.   If it was only those words in her head, it might be over by now, but she has had something like an allergic response to personal catastrophe.  It can take 6 months to recover, and we have to treat it as if she got hit in the head with a baseball bat.  Also, worth thinking of her (suspected) inflammatory syndrome much like her husband's fibromyalgia.  Important not to think of it as weakness (which she does), and important to be clear that neither she nor her husband have to be "the hero" or "the goat" in this, but partners, coping with a  combination of psychological and physical challenges and recoveries.  Alludes to her job loss in November, which reportedly involved some animosity and rejection.  Says she has not had memorable experiences before this, but clear she has been a dedicated, hard worker steeped in a self-concept where one's worth is one's work.  Can come back to it to understand better how it was traumatic; for now, accepted that it was psychologically traumatic, and her brain reacted to that with inflammation and depression.  Hx preexisting "OCD" -- hereditary from grandmother, mother, and aunt, unspecified.  Feels it was not an issue, particularly, before her breakdown, but particularly triggered by it.  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  Hx of back injury -- scoliosis from birth, with lifelong pain issue.  Elevator-door injury in high school exacerbated it, with a time of extended recovery, but able to work productively throughout adult life until last fall.  Acute worry over money -- PT gets afraid they are going into the red, husband says no, sometimes she has seen the numbers and been reassured, but she can doubt nonetheless, and she knows husband just spent $3000 on a computer system not long ago.  Without knowing the validity of her worries, proposed that she establish with husband that it be OK to look at the numbers to see for herself, provided she can be in control of whether her self-described "OCD" runs the experience.  Rule of thumb that she only look at the numbers after  calming herself ("walk, don't run") and being able to ask a limit of 3 nervous questions, otherwise recognize it as temptation to panic and hold off.  Further characterized compulsive questioning as a form of self-abuse, not to mention an irritability trigger for her chronic-pain husband, and in both ways a path to more stress hormones and more wear and tear on her brain.    Became momentarily flooded talking about  finances -- recognized beginning of dissociative worry and led in grounding technique (name three things in the room).  Also discussed problem of feeling indecisive sometimes, stuck what to do next.  Resolved to accept that, too, and reframe -- "If I'm indecisive, OK. There's probably more than one right answer here what to do." and try to just pick one.  Off the fence and engaged in anything is better than ruminating.  Per her faith, also reminded her she need not be alone in coping with it, that a kind and powerful God of her understanding means to go with her and will not condemn nor forsake her, regardless of how this feels.    Needs some more help with sleep -- reoriented to blue light therapy and recommended amber lenses.  Provided testimonial, reference, and ordering information.  Discussed outlook for therapy, as PT has allowed herself to go unscheduled and needs at least weekly contact right now.  Therapeutic modalities: Cognitive Behavioral Therapy, Mindfulness Meditation, Solution-Oriented/Positive Psychology, Psycho-education/Bibliotherapy and faith-sensitive  Mental Status/Observations:  Appearance:   Casual     Behavior:  Appropriate  Motor:  Restlestness, Shuffling Gait and stiffness  Speech/Language:   Clear and Coherent  Affect:  Depressed and anxious  Mood:  anxious and depressed  Thought process:  slowed  Thought content:    Rumination  Sensory/Perceptual disturbances:    WNL  Orientation:  grossly intact  Attention:  Good  Concentration:  Fair  Memory:  grossly intact, requires written notes to retain important feedback/direction  Insight:    Fair  Judgment:   Good  Impulse Control:  Fair   Risk Assessment: Danger to Self: No Self-injurious Behavior: No Danger to Others: No Physical Aggression / Violence: No Duty to Warn: No Access to Firearms a concern: No  Assessment of progress:  stabilized  Diagnosis:   ICD-10-CM   1. Current severe episode of major  depressive disorder without psychotic features, unspecified whether recurrent (HCC)  F32.2   2. Generalized anxiety disorder  F41.1   3. Obstructive sleep apnea syndrome  G47.33   4. Relationship problem between partners  Z63.0   5. Other chronic pain  G89.29    lifelong scoliosis + hx of compoundng back injury   Plan:  . Share notes and interpretation with husband . Continue use of breathing/calming skill . Assess whether to look in on the finances and gatekeeping with husband if it would/would not be appropriate to her mindset. . Continue trying to check impulses to "yell" at herself . Other recommendations/advice as noted above . Continue to utilize previously learned skills ad lib . Maintain medication as prescribed and work faithfully with relevant prescriber(s) if any changes are desired or seem indicated . Call the clinic on-call service, present to ER, or call 911 if any life-threatening psychiatric crisis Return in about 1 week (around 07/23/2019) for time as available, recommend scheduling ahead -- weekly through September.  Robley Friesobert Benancio Osmundson, PhD Marliss CzarAndy Elric Tirado, PhD LP Clinical Psychologist, [facility] [address], [address] [place], [place]  [id number] [phone]

## 2019-07-20 ENCOUNTER — Ambulatory Visit: Payer: BC Managed Care – PPO | Admitting: Psychiatry

## 2019-07-20 ENCOUNTER — Encounter: Payer: Self-pay | Admitting: Psychiatry

## 2019-07-20 NOTE — Progress Notes (Signed)
Admin note for no-charge patient contact  Patient ID: Meagan Moran  MRN: [id number] DATE: 07/20/2019  NS for 2pm, scheduled in-person.  Front staff called a few minutes into the hour, no answer.  TX called end of the hour, no answer at home or mobile.  LM home VM and text on mobile to be in touch to RS, explain needs if any.    Case coordination message with prescriber, Thayer Headings PMHNP, via EPIC message.  Has weekly TX appts on schedule and sees her tomorrow.  [name], PhD

## 2019-07-21 ENCOUNTER — Ambulatory Visit (INDEPENDENT_AMBULATORY_CARE_PROVIDER_SITE_OTHER): Payer: BC Managed Care – PPO | Admitting: Psychiatry

## 2019-07-21 ENCOUNTER — Other Ambulatory Visit: Payer: Self-pay

## 2019-07-21 ENCOUNTER — Encounter: Payer: Self-pay | Admitting: Psychiatry

## 2019-07-21 VITALS — BP 183/94 | HR 68 | Ht 72.0 in | Wt 220.0 lb

## 2019-07-21 DIAGNOSIS — F99 Mental disorder, not otherwise specified: Secondary | ICD-10-CM | POA: Diagnosis not present

## 2019-07-21 DIAGNOSIS — F39 Unspecified mood [affective] disorder: Secondary | ICD-10-CM

## 2019-07-21 DIAGNOSIS — F5105 Insomnia due to other mental disorder: Secondary | ICD-10-CM

## 2019-07-21 DIAGNOSIS — F411 Generalized anxiety disorder: Secondary | ICD-10-CM | POA: Diagnosis not present

## 2019-07-21 MED ORDER — BUSPIRONE HCL 15 MG PO TABS: ORAL_TABLET | ORAL | 1 refills | Status: DC

## 2019-07-21 MED ORDER — OXCARBAZEPINE 300 MG PO TABS: 300.0000 mg | ORAL_TABLET | Freq: Every day | ORAL | 1 refills | Status: DC

## 2019-07-21 NOTE — Progress Notes (Signed)
Meagan Moran [id number] 01/30/1956 63 y.o.  Subjective:   Patient ID:  Meagan Moran is a 63 y.o. (DOB 1955-12-14) female.  Chief Complaint:  Chief Complaint  Patient presents with  . Anxiety  . Depression  . Insomnia  . Other    Impaired concentration    HPI Meagan Moran presents to the office today for follow-up of anxiety and depression. She is unaccompanied. She reports that her husband is concerned that she may need medication "to be more zombified and slow my mind down." She reports that she has been "confused" and missed therapy apt yesterday. She reports that there has been some improvement in cognition. She reports trying to use routines to help with forgetfulness and will repeat tasks that she knows she has already completed.   She reports awakening at 4 am and will go for a walk upon awakening to try to help with anxiety. She reports having anxious thoughts immediately upon awakening. Continues to expereince excessive worry, particularly about money. She reports that there has been some improvement in anxiety with medications but anxiety remains high and unmanageable at times. She reports that there has been some slight improvement in Gi s/s. She reports having a panic attack every 2-3 days.   She reports that her mood remains depressed and that it is "up and down" wither periods of more intense depression. She reports some slight improvement in depression. She continues to experience anger in response to marital stress. Denies nightmares. Reports early morning awakenings. She typically goes to sleep around 9-10 pm. Energy remains low. Motivation is good. Reports continued concentration difficulties. She reports, "I'm always hungry." She reports that she will often npt eat when she is hungry since she does not want to gain back the weight she has lost. Denies SI.   Denies impulsive or risky behavior. She reports that racing thoughts have improvd and are less frequent.  She  reports that she may be inadvertently taking only one Cymbalta 30 mg in the morning instead of two capsules in the morning.   Past Psychiatric Medication Trials: Cymbalta- Effective for anxiety at 60 mg po qd.  Prozac- side effects Wellbutrin- side effects Trileptal- Effective. Caused nightmares. Was taking 300 mg po BID.   Review of Systems:  Review of Systems  Constitutional: Positive for fatigue and unexpected weight change.  HENT: Positive for congestion, sinus pain and tinnitus.   Eyes: Positive for discharge.  Respiratory: Positive for cough and wheezing.   Cardiovascular: Positive for palpitations.  Gastrointestinal: Positive for abdominal pain, constipation, diarrhea and nausea.  Genitourinary: Positive for urgency.  Musculoskeletal: Negative for gait problem.  Skin:       Dry skin  Allergic/Immunologic: Positive for environmental allergies.  Neurological: Positive for dizziness, tremors, weakness and headaches.  Hematological: Bruises/bleeds easily.  Psychiatric/Behavioral:       Please refer to HPI    Medications: I have reviewed the patient's current medications.  Current Outpatient Medications  Medication Sig Dispense Refill  . ALPRAZolam (XANAX) 0.5 MG tablet TAKE 1 TABLET EVERY 12 HOURS AS NEEDED FOR ANXIETY    . amLODipine (NORVASC) 5 MG tablet TAKE 1 TABLET EVERY DAY AT LUNCH    . DULoxetine (CYMBALTA) 60 MG capsule TAKE 1 CAPSULE BY MOUTH EVERY DAY 90 capsule 0  . hydrochlorothiazide (MICROZIDE) 12.5 MG capsule Take 12.5 mg by mouth daily as needed.    Marland Kitchen HYDROcodone-acetaminophen (NORCO) 7.5-325 MG tablet     . losartan (COZAAR) 100 MG tablet Take  100 mg by mouth daily.    . Oxcarbazepine (TRILEPTAL) 300 MG tablet Take 1 tablet (300 mg total) by mouth at bedtime. 30 tablet 1  . pravastatin (PRAVACHOL) 20 MG tablet Take 20 mg by mouth daily.    Marland Kitchen tiZANidine (ZANAFLEX) 4 MG tablet TAKE 1 TABLET 4 TIMES A DAY AS NEEDED FOR MUSCLE SPASMS    . busPIRone (BUSPAR) 15  MG tablet Take 1/3 tablet p.o. twice daily for 1 week, then take 2/3 tablet p.o. twice daily for 1 week, then take 1 tablet p.o. twice daily 60 tablet 1   No current facility-administered medications for this visit.     Medication Side Effects: None  Allergies:  Allergies  Allergen Reactions  . Nsaids   . Sulfa Antibiotics     Past Medical History:  Diagnosis Date  . Back pain   . Diabetes mellitus, type II (HCC)   . Heart murmur   . Sleep apnea     Family History  Problem Relation Age of Onset  . Anxiety disorder Mother   . Anxiety disorder Maternal Grandmother   . Anxiety disorder Daughter     Social History   Socioeconomic History  . Marital status: Married    Spouse name: Not on file  . Number of children: Not on file  . Years of education: Not on file  . Highest education level: Not on file  Occupational History  . Not on file  Social Needs  . Financial resource strain: Not on file  . Food insecurity    Worry: Not on file    Inability: Not on file  . Transportation needs    Medical: Not on file    Non-medical: Not on file  Tobacco Use  . Smoking status: Current Every Day Smoker  . Smokeless tobacco: Never Used  Substance and Sexual Activity  . Alcohol use: Not Currently  . Drug use: Not Currently  . Sexual activity: Not on file  Lifestyle  . Physical activity    Days per week: Not on file    Minutes per session: Not on file  . Stress: Not on file  Relationships  . Social Musician on phone: Not on file    Gets together: Not on file    Attends religious service: Not on file    Active member of club or organization: Not on file    Attends meetings of clubs or organizations: Not on file    Relationship status: Not on file  . Intimate partner violence    Fear of current or ex partner: Not on file    Emotionally abused: Not on file    Physically abused: Not on file    Forced sexual activity: Not on file  Other Topics Concern  . Not  on file  Social History Narrative  . Not on file    Past Medical History, Surgical history, Social history, and Family history were reviewed and updated as appropriate.   Please see review of systems for further details on the patient's review from today.   Objective:   Physical Exam:  BP (!) 183/94   Pulse 68   Ht 6' (1.829 m)   Wt 220 lb (99.8 kg)   BMI 29.84 kg/m   Physical Exam  Lab Review:  No results found for: NA, K, CL, CO2, GLUCOSE, BUN, CREATININE, CALCIUM, PROT, ALBUMIN, AST, ALT, ALKPHOS, BILITOT, GFRNONAA, GFRAA  No results found for: WBC, RBC, HGB, HCT, PLT, MCV, MCH,  MCHC, RDW, LYMPHSABS, MONOABS, EOSABS, BASOSABS  No results found for: POCLITH, LITHIUM   No results found for: PHENYTOIN, PHENOBARB, VALPROATE, CBMZ   .res Assessment: Plan:   Discussed treatment options and agree with plan to increase Cymbalta to 60 mg daily to improve anxiety and depression.  Discussed potential benefits, risks, and side effects of BuSpar for anxiety.  Discussed that some studies indicate that BuSpar may also be helpful for concentration difficulties.  Patient agrees to trial of BuSpar. Will continue Trileptal 300 mg at bedtime since patient reports that this has been somewhat helpful for insomnia, anxiety, racing thoughts. Recommend continuing psychotherapy with Marliss CzarAndy Mitchum, PhD. Patient to follow-up in 6 weeks or sooner if clinically indicated. Patient advised to contact office with any questions, adverse effects, or acute worsening in signs and symptoms.  Meagan Moran was seen today for anxiety, depression, insomnia and other.  Diagnoses and all orders for this visit:  Generalized anxiety disorder -     busPIRone (BUSPAR) 15 MG tablet; Take 1/3 tablet p.o. twice daily for 1 week, then take 2/3 tablet p.o. twice daily for 1 week, then take 1 tablet p.o. twice daily -     Oxcarbazepine (TRILEPTAL) 300 MG tablet; Take 1 tablet (300 mg total) by mouth at bedtime.  Episodic mood  disorder (HCC) -     Oxcarbazepine (TRILEPTAL) 300 MG tablet; Take 1 tablet (300 mg total) by mouth at bedtime.  Insomnia due to other mental disorder     Please see After Visit Summary for patient specific instructions.  Future Appointments  Date Time Provider Department Center  07/27/2019  9:00 AM Robley FriesMitchum, Robert, PhD CP-[facility] None  07/30/2019  8:00 AM Robley FriesMitchum, Robert, PhD [facility] None  08/03/2019  8:00 AM Robley FriesMitchum, Robert, PhD [facility] None  08/06/2019 10:00 AM Robley FriesMitchum, Robert, PhD [facility] None  09/01/2019  1:45 PM Corie Chiquitoarter, Mannie Wineland, PMHNP CP-CP None    No orders of the defined types were placed in this encounter.   -------------------------------

## 2019-07-21 NOTE — Patient Instructions (Signed)
Start BuSpar 15 mg 1/3 tablet twice daily for 1 week, then increase to 2/3 tablet twice daily for 1 week, then increase to 1 tablet twice daily for anxiety.   Take Duloxetine (Cymbalta) 60 mg in the morning.  Take Oxcarbazepine (Trileptal) 300 mg at bedtime.

## 2019-07-25 ENCOUNTER — Other Ambulatory Visit: Payer: Self-pay | Admitting: Psychiatry

## 2019-07-25 DIAGNOSIS — F39 Unspecified mood [affective] disorder: Secondary | ICD-10-CM

## 2019-07-25 DIAGNOSIS — F411 Generalized anxiety disorder: Secondary | ICD-10-CM

## 2019-07-26 NOTE — Telephone Encounter (Signed)
Has 1 refill on file 

## 2019-07-27 ENCOUNTER — Ambulatory Visit (INDEPENDENT_AMBULATORY_CARE_PROVIDER_SITE_OTHER): Payer: BC Managed Care – PPO | Admitting: Psychiatry

## 2019-07-27 ENCOUNTER — Other Ambulatory Visit: Payer: Self-pay

## 2019-07-27 DIAGNOSIS — F5105 Insomnia due to other mental disorder: Secondary | ICD-10-CM

## 2019-07-27 DIAGNOSIS — F322 Major depressive disorder, single episode, severe without psychotic features: Secondary | ICD-10-CM

## 2019-07-27 DIAGNOSIS — Z630 Problems in relationship with spouse or partner: Secondary | ICD-10-CM

## 2019-07-27 DIAGNOSIS — G4733 Obstructive sleep apnea (adult) (pediatric): Secondary | ICD-10-CM

## 2019-07-27 DIAGNOSIS — F99 Mental disorder, not otherwise specified: Secondary | ICD-10-CM

## 2019-07-27 DIAGNOSIS — F411 Generalized anxiety disorder: Secondary | ICD-10-CM | POA: Diagnosis not present

## 2019-07-27 DIAGNOSIS — F17210 Nicotine dependence, cigarettes, uncomplicated: Secondary | ICD-10-CM

## 2019-07-27 DIAGNOSIS — G8929 Other chronic pain: Secondary | ICD-10-CM

## 2019-07-27 DIAGNOSIS — E1100 Type 2 diabetes mellitus with hyperosmolarity without nonketotic hyperglycemic-hyperosmolar coma (NKHHC): Secondary | ICD-10-CM

## 2019-07-27 NOTE — Progress Notes (Signed)
Psychotherapy Progress Note [facility] [name], PhD LP  Patient ID: Meagan Moran     MRN: [id number]     Therapy format: Individual psychotherapy Date: 07/27/2019     Start: 9:14a Stop: 10:03a Time Spent: 49 min Location: in-person   Session narrative (presenting needs, interim history, self-report of stressors and symptoms, applications of prior therapy, status changes, and interventions made in session) Arrived 8am by mistake.  Brought notebook to record advice.  Immediately apparent she is more alert and centered.  Med check last week noted to add BuSpar for anxiety.  Meagan Moran is back in the bedroom but considering moving back out into son's room "until I'm better".  Does feel more frankly blamed, sometimes, but not crumbling over it after getting some better sleep.  Describes H making hasty judgments about what is needed -- has her going back to a personal trainer tomorrow morning, is alarmed she has lost 20 lbs in a month (despite eating fairly normally), doesn't want her trying to do physical work at home right now (e.g., a fridge that needs cleaning out after food spoiled).  Reports of him having security encounters at other doctor offices and stores for fighting masking policy.  Refrigerator now having electrical problems, which has in part alarmed her and in part just presents worries about money and nasty work to do, which H says to leave to him until she's better.  PT wants to work on rescuing food and cleaning out herself, though the smell of some spoiled food is oppressive to her.  Discussed the value of doing a little something normal and contributory to feel better, stimulate her brain, and help offset the sense of imbalance and distortion in their relationship, including perceived risk of husband lashing back for being burdened.  Still irritated with herself for burning a meatloaf last week, but laying it to rest and eating the leftovers.  B.s. fluctuations were  happening before her job loss in the fall.  Getting juice to raise it if low contributes to urinary incontinence.   Hit 512 this morning.    Continues to smoke, but less with Wellbutrin.  Does want to quit.  Coffee in the morning helps be more alert.  Losing weight -- H says 20 lbs in a month.  Believes she is just overdoing things, but she eats.    Awaiting back MRI results.  Daughter going to surgery for hip issue.  D concerned about going on narcotic for pain.  Confirms sleeping better.  Dreams have calmed down.  Relaxing and calming down is still the issue, but better at it.  Prayer is helping.  Some self-soothing with a phone game.  Shared with husband about amber glasses, not obtained yet.  Getting to sleep OK by pausing and relaxing before bed, saying prayers.  Noise machine helps, as does having Meagan Moran back in the bed.    PT uses an old CPAP machine, not clear whether it is mold-free.  Recommend looking into it further.  Some question of indoor air quality.  They have filter on the HVAC system, but she feels vacuuming carpet may pump allergens into the air.  Uses Benadryl 1-2 a day for allergies.  Educated on cognitive risks and recommended look into non-drowsy alternative for daytime.  Overall, discussed and encouraged pitching in on relatively small things, at her discretion, finding the next "normal, useful" thing, for stress and self-esteem.    Therapeutic modalities: Cognitive Behavioral Therapy  Mental Status/Observations:  Appearance:   Casual     Behavior:  Appropriate  Motor:  Normal  Speech/Language:   Clear and Coherent  Affect:  Appropriate and nonlabile  Mood:  depressed and responsive, better energy  Thought process:  normal  Thought content:    worries, less obsessive  Sensory/Perceptual disturbances:    WNL  Orientation:  grossly intact  Attention:  Good  Concentration:  Fair  Memory:  better  Insight:    Good  Judgment:   Fair  Impulse Control:  Fair   Risk  Assessment: Danger to Self: No Self-injurious Behavior: No Danger to Others: No Physical Aggression / Violence: No Duty to Warn: No Access to Firearms a concern: No  Assessment of progress:  progressing  Diagnosis:   ICD-10-CM   1. Generalized anxiety disorder  F41.1   2. Current severe episode of major depressive disorder without psychotic features, unspecified whether recurrent (HCC)  F32.2   3. Insomnia due to other mental disorder  F51.05    F99   4. Relationship problem between partners  Z63.0   5. Obstructive sleep apnea syndrome  G47.33   6. Other chronic pain  G89.29   7. Type 2 diabetes mellitus with hyperosmolarity without coma, unspecified whether long term insulin use (HCC)  E11.00   8. Cigarette nicotine dependence, uncomplicated  F17.210    Plan:  . Focus on small, attainable efforts, any normal tasks she can reasonably do and share with husband it's OK . Continue reliable sleep efforts, verify CPAP is nonallergenic . Other recommendations/advice as noted above . Continue to utilize previously learned skills ad lib . Maintain medication as prescribed and work faithfully with relevant prescriber(s) if any changes are desired or seem indicated . Call the clinic on-call service, present to ER, or call 911 if any life-threatening psychiatric crisis Return for session(s) already scheduled.  Robley Friesobert Talib Headley, PhD Marliss CzarAndy Cyndia Degraff, PhD LP Clinical Psychologist, [facility] [address], [address] [place], [place] [id number] [phone]

## 2019-07-30 ENCOUNTER — Other Ambulatory Visit: Payer: Self-pay

## 2019-07-30 ENCOUNTER — Ambulatory Visit (INDEPENDENT_AMBULATORY_CARE_PROVIDER_SITE_OTHER): Payer: BC Managed Care – PPO | Admitting: Psychiatry

## 2019-07-30 DIAGNOSIS — M545 Low back pain, unspecified: Secondary | ICD-10-CM

## 2019-07-30 DIAGNOSIS — F322 Major depressive disorder, single episode, severe without psychotic features: Secondary | ICD-10-CM

## 2019-07-30 DIAGNOSIS — E1100 Type 2 diabetes mellitus with hyperosmolarity without nonketotic hyperglycemic-hyperosmolar coma (NKHHC): Secondary | ICD-10-CM

## 2019-07-30 DIAGNOSIS — Z630 Problems in relationship with spouse or partner: Secondary | ICD-10-CM

## 2019-07-30 DIAGNOSIS — F99 Mental disorder, not otherwise specified: Secondary | ICD-10-CM

## 2019-07-30 DIAGNOSIS — F411 Generalized anxiety disorder: Secondary | ICD-10-CM

## 2019-07-30 DIAGNOSIS — F5105 Insomnia due to other mental disorder: Secondary | ICD-10-CM | POA: Diagnosis not present

## 2019-07-30 NOTE — Progress Notes (Signed)
Psychotherapy Progress Note [facility] [name], PhD LP  Patient ID: Meagan Moran     MRN: [id number]     Therapy format: Individual psychotherapy Date: 07/30/2019     Start: 8:14a Stop: 9:05a Time Spent: 51 min Location: in-person   Session narrative (presenting needs, interim history, self-report of stressors and symptoms, applications of prior therapy, status changes, and interventions made in session) Up a little later this morning, got work with trainer the other day, a little barbell work outside, feels better.  Jiles Prows is good at relaxation techniques.  Constipation letting up a bit. Scratching habit (undisclosed) relieved.  B.s. varies still.  Generally trying to keep calm (prayer helpful).  Trying to use LA insulin for high b.s. New endocrinologist (9/30).  Still sleeping same bed.  H notes ups and downs, sleeping till 5pm (due to back pain).  He wants to come with her next appt (9/28).  He is watching the bills, some stacking up because new computer not set up. 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  Looking at taking [name] off her insurance (he has Medicare already).  He is still concerned about her losing weight, though she is overweight as yet.  Had to throw out some food for freezer and fridge not working properly.    Discussed back pain -- she has recommendations for slowing down (of course), and workout helps.  Added postural recommendation to relieve.  Feeling more ready to find work, wants to feel more normal that way.  Looking into consulting.  Used to visit MS patients, enjoyed that.  Abiding concerns for managing money -- H handles stocks, sometimes asks her for advice, feels unfamiliar, on the spot.  Knows money and debts lie in many places.  Suggestion to inventory accounts to be aware and reassured about the state of things, at least now that she is more cogent.  Re. marital communication, recommend be ready to let H know if his urgent tone gets  in the way of relaxing, managing stress.  Try to keep faith that he means to protect her when he seems to lose patience, and he is probably not taking things as personally as she personalizes them to be.  Therapeutic modalities: Cognitive Behavioral Therapy and Solution-Oriented/Positive Psychology  Mental Status/Observations:  Appearance:   Casual     Behavior:  Appropriate  Motor:  Normal  Speech/Language:   Clear and Coherent  Affect:  freer, more responsive   Spontaneous smiles - 1st yet  Mood:  anxious, depressed and improved  Thought process:  normal  Thought content:    WNL  Sensory/Perceptual disturbances:    WNL  Orientation:  grossly intact  Attention:  Good  Concentration:  Good  Memory:  grossly intact  Insight:    Good  Judgment:   Good  Impulse Control:  Good   Risk Assessment: Danger to Self: No Self-injurious Behavior: No Danger to Others: No Physical Aggression / Violence: No Duty to Warn: No Access to Firearms a concern: No  Assessment of progress:  progressing well  Diagnosis:   ICD-10-CM   1. Generalized anxiety disorder  F41.1   2. Current severe episode of major depressive disorder without psychotic features, unspecified whether recurrent ([place])  F32.2   3. Insomnia due to other mental disorder  F51.05    F99   4. Other chronic pain  G89.29   5. Type 2 diabetes mellitus with hyperosmolarity without coma, unspecified whether long term insulin use (HCC)  E11.00  Plan:  . Continue with lifestyle changes, pacing activities and self-expectations . OK to explore work . Other recommendations/advice as noted above . Continue to utilize previously learned skills ad lib . Maintain medication as prescribed and work faithfully with relevant prescriber(s) if any changes are desired or seem indicated . Call the clinic on-call service, present to ER, or call 911 if any life-threatening psychiatric crisis Return for time as available.  Robley Friesobert Carrianne Hyun, PhD Marliss CzarAndy  Deeric Cruise, PhD LP Clinical Psychologist, [facility] [address], [address] [place], [place] [address] [phone](o) [phone]

## 2019-08-03 ENCOUNTER — Other Ambulatory Visit: Payer: Self-pay

## 2019-08-03 ENCOUNTER — Ambulatory Visit (INDEPENDENT_AMBULATORY_CARE_PROVIDER_SITE_OTHER): Payer: BC Managed Care – PPO | Admitting: Psychiatry

## 2019-08-03 DIAGNOSIS — G8929 Other chronic pain: Secondary | ICD-10-CM

## 2019-08-03 DIAGNOSIS — F322 Major depressive disorder, single episode, severe without psychotic features: Secondary | ICD-10-CM | POA: Diagnosis not present

## 2019-08-03 DIAGNOSIS — F5105 Insomnia due to other mental disorder: Secondary | ICD-10-CM

## 2019-08-03 DIAGNOSIS — E1100 Type 2 diabetes mellitus with hyperosmolarity without nonketotic hyperglycemic-hyperosmolar coma (NKHHC): Secondary | ICD-10-CM

## 2019-08-03 DIAGNOSIS — F411 Generalized anxiety disorder: Secondary | ICD-10-CM

## 2019-08-03 DIAGNOSIS — Z630 Problems in relationship with spouse or partner: Secondary | ICD-10-CM

## 2019-08-03 DIAGNOSIS — F99 Mental disorder, not otherwise specified: Secondary | ICD-10-CM

## 2019-08-03 NOTE — Progress Notes (Signed)
Psychotherapy Progress Note [facility] [name], PhD LP  Patient ID: Meagan Moran     MRN: [id number]     Therapy format: Individual psychotherapy Date: 08/03/2019     Start: 8:15a Stop: 9:00a Time Spent: 45 min Location: in-person   Session narrative (presenting needs, interim history, self-report of stressors and symptoms, applications of prior therapy, status changes, and interventions made in session) Personal trainer helpful..  Sleeping a bit better.  Still sleeping some by day due to pain flareups and, admittedly, to get some time away from husband.  Husband remains in the bed at night, taking care of rental business, trying to keep refrigerator running, working long time on the new computer.  H's health issues include suspicious prostate reading, breakout jock itch, diabetes, going for a bowel biopsy.  Personally, itching less, new endocrinologist this Thursday.  Continues to worry during the days about money, society, and mostly relationship.  "Not always making healthy choices eating."  Has small candy bars to bring sugar back up when low.    H scheduled to come with her on 9/28.  May want to see if he could use individual tx, may want marital tx, though Meagan Moran is nervous about it.  He has cited "positive changes" for her, sees her organizing dinner again.  PT not so sure, but she may be exaggerating due to perfectionism.  Still doubts whether money is reliable, even though Meagan Moran shows her.  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  Worries about the condition of the bathroom floor (water damage, uneven), whether she'll fall, notes many areas of disorganization and clutter in the house.   Breathing practice and relaxers very helpful.  Does not have amber glasses yet Meagan Moran to order). Trying to stay careful not to overdo housework, pace herself.  Discussed  possibilities and priorities for short bursts of housework.  Recommended work with Meagan Moran about things that she can go on and do independently for the common good.  Re. laundry errand today, recommended to consciously feel the clothes she will be working with in doing laundry later today.    Re. 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  Approved, so long as she does not overextend.  Re. home tasks, recommended use her same perspective and skill with herself and self-expectations as she would with those she used to visit.  Therapeutic modalities: Cognitive Behavioral Therapy and Solution-Oriented/Positive Psychology  Mental Status/Observations:  Appearance:   Casual     Behavior:  Appropriate  Motor:  Normal  Speech/Language:   Clear and Coherent and slowed a bit   Affect:  Appropriate  Mood:  anxious and dysthymic  Thought process:  normal  Thought content:    WNL  Sensory/Perceptual disturbances:    WNL  Orientation:  grossly intact  Attention:  Good  Concentration:  Fair  Memory:  grossly intact, improved  Insight:    Good  Judgment:   Fair  Impulse Control:  Fair   Risk Assessment: Danger to Self: No Self-injurious Behavior: No Danger to Others: No Physical Aggression / Violence: No Duty to Warn: No Access to Firearms a concern: No  Assessment of progress:  progressing  Diagnosis:   ICD-10-CM   1. Current severe episode of major depressive disorder without psychotic features, unspecified whether recurrent ([place])  F32.2   2. Generalized anxiety disorder  F41.1   3. Insomnia due to other mental disorder  F51.05    F99   4. Other chronic pain  G89.29   5. Type 2 diabetes mellitus with hyperosmolarity without coma, unspecified whether long term insulin use (HCC)  E11.00   6. Relationship problem between partners  Z63.0    Plan:  . Continue with liberal use of breathing technique and humane self-expectations . Continue emphasis on pacing  exertion and  . Work with husband on things she can reasonably do independently to tackle house cleanup or repairs, or business functions . Other recommendations/advice as noted above . Continue to utilize previously learned skills ad lib . Maintain medication as prescribed and work faithfully with relevant prescriber(s) if any changes are desired or seem indicated . Call the clinic on-call service, present to ER, or call 911 if any life-threatening psychiatric crisis No follow-ups on file.  Robley Friesobert Shalissa Easterwood, PhD Marliss CzarAndy Deone Leifheit, PhD LP Clinical Psychologist, [facility] [address], [address] [place], [place] [address] [phone]) [phone]

## 2019-08-05 ENCOUNTER — Ambulatory Visit (INDEPENDENT_AMBULATORY_CARE_PROVIDER_SITE_OTHER): Payer: BC Managed Care – PPO | Admitting: Internal Medicine

## 2019-08-05 ENCOUNTER — Encounter: Payer: Self-pay | Admitting: Internal Medicine

## 2019-08-05 ENCOUNTER — Other Ambulatory Visit: Payer: Self-pay

## 2019-08-05 VITALS — BP 130/86 | HR 75 | Ht 72.0 in | Wt 219.0 lb

## 2019-08-05 DIAGNOSIS — Z794 Long term (current) use of insulin: Secondary | ICD-10-CM

## 2019-08-05 DIAGNOSIS — E1165 Type 2 diabetes mellitus with hyperglycemia: Secondary | ICD-10-CM

## 2019-08-05 MED ORDER — FREESTYLE LIBRE 2 READER SYSTM DEVI: 1.0000 | Freq: Once | 0 refills | Status: DC

## 2019-08-05 MED ORDER — METFORMIN HCL 500 MG PO TABS: 500.0000 mg | ORAL_TABLET | Freq: Every day | ORAL | 3 refills | Status: DC

## 2019-08-05 MED ORDER — GLUCAGON EMERGENCY 1 MG IJ KIT: 1.0000 mg | PACK | Freq: Once | INTRAMUSCULAR | 12 refills | Status: DC | PRN

## 2019-08-05 MED ORDER — FREESTYLE LIBRE 2 SENSOR SYSTM MISC: 1.0000 | 3 refills | Status: DC

## 2019-08-05 NOTE — Progress Notes (Signed)
GERD 0 patient ID: Meagan Moran, female   DOB: [birth year], 63 y.o.   MRN: [id number]   HPI: Meagan Moran is a 63 y.o.-year-old female, referred by her PCP, Dr. Carol Ada, for management of DM2, dx in 2005, insulin-dependent, uncontrolled, without long-term complications. She is here with her husband who offers part of the hx especially with PMH, medications, and blood sugars.  Reviewed latest HbA1c level: 07/01/2019: HbA1c 6.9% 09/16/2018: HbA1c 7.1% No results found for: HGBA1C  Pt is on a regimen of: - Metformin 500 mg 2x a day, with meals  Prev. On: - Tresiba 90 +/-20 units at bedtime >> stopped 3 mo ago - Novolog ~10 units 3x a day, before meals   Pt checks her sugars 4-6x a day and they are: - am: 30-40 (when on Libre)-300 but on recheck: 100s - 2h after b'fast: n/c - before lunch: 50-150 - 2h after lunch: n/c - before dinner: 90-120 - 2h after dinner: n/c - bedtime: 150-180 - nighttime: n/c Lowest sugar was 30; she has hypoglycemia awareness at 100.  Highest sugar was 300.  Glucometer: [employer], One Touch  Pt's meals are: (pbs with teeth, cannot eat hard foods) - Breakfast: trying oatmeal - Lunch: cold cut meats + cheese puffs, 3 [employer]kete[employer] bar - Dinner: Different - Snacks: chocolate, juice She tries to walk for exercise daily  - no CKD, last BUN/creatinine:  06/24/2019: 7/0.58, glucose 172 No results found for: BUN, CREATININE On losartan.  -+ HL; last set of lipids: 06/24/2019: 207/130/43/138 09/16/2018: 267/404/38/190 No results found for: CHOL, HDL, LDLCALC, LDLDIRECT, TRIG, CHOLHDL On pravastatin 20 mg daily.  Also on Vascepa.  - last eye exam was on 09/14/2018. No DR. + cataract.  - no numbness and tingling in her feet.  Latest diabetic foot exam was on 07/01/2019: Abnormal: callused feet, onychomycosis, long nails; but with normal circulation and response to monofilament.  She is on Cymbalta 60 mg daily.  Latest TSH was reviewed and this was  normal: 06/24/2019: TSH 1.06  Pt has FH of DM in [place], [place], M aunt, M cousins.  ROS: Constitutional: no weight gain, + weight loss - 70 lbs in last 7 mo- husband attributes this to stress and decreased appetite, + fatigue fatigue, no subjective hyperthermia, + subjective hypothermia, + nocturia Eyes: + blurry vision, no xerophthalmia ENT: + Sore throat, no nodules palpated in throat, no dysphagia/odynophagia, + hoarseness, + tinnitus Cardiovascular: no CP/no SOB/+ occasional palpitations/+ leg swelling Respiratory: + Cough/no SOB/+ wheezes (asthma) Gastrointestinal: + N/no V/+ D/ no C, + heartburn Musculoskeletal: + muscle aches/no joint aches/+ bone pain (injury) Skin: + Occasional rashes, + itching, + excessive hair growth Neurological: no tremors/numbness/tingling/dizziness, + HA Psychiatric: + Anxiety and depression  Past Medical History:  Diagnosis Date  . Back pain   . Diabetes mellitus, type II ([place])   . Heart murmur   . Sleep apnea    Past Surgical History:  Procedure Laterality Date  . CESAREAN SECTION     Social History   Socioeconomic History  . Marital status: Married    Spouse name: Not on file  . Number of children: 2  . Years of education: Not on file  . Highest education level: Not on file  Occupational History  .  Early retirement  Social Needs  . Financial resource strain: Not on file  . Food insecurity    Worry: Not on file    Inability: Not on file  . Transportation needs  Medical: Not on file    Non-medical: Not on file  Tobacco Use  . Smoking status: Current Every Day Smoker  . Smokeless tobacco: Never Used  Substance and Sexual Activity  . Alcohol use:  Once a year  . Drug use: No   Current Outpatient Medications on File Prior to Visit  Medication Sig Dispense Refill  . ALPRAZolam (XANAX) 0.5 MG tablet TAKE 1 TABLET EVERY 12 HOURS AS NEEDED FOR ANXIETY    . amLODipine (NORVASC) 5 MG tablet TAKE 1 TABLET EVERY DAY AT LUNCH    . busPIRone  (BUSPAR) 15 MG tablet Take 1/3 tablet p.o. twice daily for 1 week, then take 2/3 tablet p.o. twice daily for 1 week, then take 1 tablet p.o. twice daily 60 tablet 1  . DULoxetine (CYMBALTA) 60 MG capsule TAKE 1 CAPSULE BY MOUTH EVERY DAY 90 capsule 0  . hydrochlorothiazide (MICROZIDE) 12.5 MG capsule Take 12.5 mg by mouth daily as needed.    Marland Kitchen. HYDROcodone-acetaminophen (NORCO) 7.5-325 MG tablet     . losartan (COZAAR) 100 MG tablet Take 100 mg by mouth daily.    . Oxcarbazepine (TRILEPTAL) 300 MG tablet Take 1 tablet (300 mg total) by mouth at bedtime. 30 tablet 1  . pravastatin (PRAVACHOL) 20 MG tablet Take 20 mg by mouth daily.    Marland Kitchen. tiZANidine (ZANAFLEX) 4 MG tablet TAKE 1 TABLET 4 TIMES A DAY AS NEEDED FOR MUSCLE SPASMS     No current facility-administered medications on file prior to visit.    Allergies  Allergen Reactions  . Nsaids   . Sulfa Antibiotics    Family History  Problem Relation Age of Onset  . Anxiety disorder Mother   . Anxiety disorder Maternal Grandmother   . Anxiety disorder Daughter   She also has a history of HL and lung cancer in father; heart disease in mother.  PE: BP 130/86   Pulse 75   Ht 6' (1.829 m)   Wt 219 lb (99.3 kg)   SpO2 98%   BMI 29.70 kg/m  Wt Readings from Last 3 Encounters:  08/05/19 219 lb (99.3 kg)   Constitutional: overweight, in NAD Eyes: PERRLA, EOMI, no exophthalmos ENT: moist mucous membranes, no thyromegaly, no cervical lymphadenopathy Cardiovascular: RRR, No MRG Respiratory: CTA B Gastrointestinal: abdomen soft, NT, ND, BS+ Musculoskeletal: no deformities, strength intact in all 4 Skin: moist, warm, no rashes Neurological: no tremor with outstretched hands, DTR normal in all 4  ASSESSMENT: 1. DM2, insulin-dependent, uncontrolled, without long-term complications, but with hypo and hyper glycemia  PLAN:  1. Patient with long-standing, uncontrolled diabetes, on oral antidiabetic regimen with metformin only, now 3 months off  insulin.  Patient and her husband describe significant weight loss in the last 7 months with dramatic improvement in her blood sugars so she could come off a high dose of insulin approximately 3 months ago.  They attributed the weight loss to stress, but she also complains of lack of appetite.  A colonoscopy is coming up.  No known cancer diagnosis. -They do not bring meter or the freestyle libre receiver but give a history of low blood sugars in the morning, even in the 30s.  We did discuss that these are not very accurate if checked with the libre CGM so she always needs to check with her glucometer if she sees such unusual readings on the [place].  However they tell me she did not have the [place] recently since apparently this was not covered.  They  would want to try again to obtain it and I sent the freestyle libre to to her pharmacy.  I advised her that this has alarms now, triggered by low or high blood sugars.  During the rest of the day, her sugars appear fairly well-controlled, without significant high or low blood sugars.  I am surprised of the low blood sugars in the morning but I do not have a very good history of the accuracy of the reading.  We did discuss that sometimes low blood sugars could be a sign of expired test strips or an calibrated glucometer.  We discussed about how to fix this and I advised him to let me know if she continues to see low blood sugars after doing so.  Her most recent HbA1c decreased from 7.1 to 6.9%, which is at goal.  For now, to avoid further low blood sugars I advised her to decrease the metformin dose to only keep the morning 500 mg tablet.  I will not make any other changes for now.  I would like to see her back in 1.5 months hopefully with her CGM.  However, I advised the husband to drop by in approximately 2 weeks to let us download her CGM since she may need further regimen adjustment before next visit. -I also sent a prescription for glucagon to her pharmacy just  in case the low blood sugar readings are real.  I advised the husband how to use this. - I suggested to:  Patient Instructions  Please decrease: - Metformin 500 mg with b'fast.  Start the [employer] 2.  Send me the reports in 2 weeks.  Please return in 1.5 months with your sugar log.   - Strongly advised her to start checking sugars at different times of the day - check 4x a day with a freestyle libre CGM - discussed about CBG targets for treatment: 80-130 mg/dL before meals and <297 mg/dL after meals; target [id number] <7%. - given sugar log and advised how to fill it and to bring it at next appt in case they cannot get the CGM - given foot care handout and explained the principles  - given instructions for hypoglycemia management "15-15 rule"  - advised for yearly eye exams  - Return to clinic in 1.5 mo with sugar log   Carlus Pavlov, MD PhD [facility]

## 2019-08-05 NOTE — Patient Instructions (Signed)
Please decrease: - Metformin 500 mg with b'fast.  Start the [employer] Libre 2.  Send me the reports in 2 weeks.  Please return in 1.5 months with your sugar log.   PATIENT INSTRUCTIONS FOR TYPE 2 DIABETES:  DIET AND EXERCISE Diet and exercise is an important part of diabetic treatment.  We recommended aerobic exercise in the form of brisk walking (working between 40-60% of maximal aerobic capacity, similar to brisk walking) for 150 minutes per week (such as 30 minutes five days per week) along with 3 times per week performing 'resistance' training (using various gauge rubber tubes with handles) 5-10 exercises involving the major muscle groups (upper body, lower body and core) performing 10-15 repetitions (or near fatigue) each exercise. Start at half the above goal but build slowly to reach the above goals. If limited by weight, joint pain, or disability, we recommend daily walking in a swimming pool with water up to waist to reduce pressure from joints while allow for adequate exercise.    BLOOD GLUCOSES Monitoring your blood glucoses is important for continued management of your diabetes. Please check your blood glucoses 2-4 times a day: fasting, before meals and at bedtime (you can rotate these measurements - e.g. one day check before the 3 meals, the next day check before 2 of the meals and before bedtime, etc.).   HYPOGLYCEMIA (low blood sugar) Hypoglycemia is usually a reaction to not eating, exercising, or taking too much insulin/ other diabetes drugs.  Symptoms include tremors, sweating, hunger, confusion, headache, etc. Treat IMMEDIATELY with 15 grams of Carbs: . 4 glucose tablets .  cup regular juice/soda . 2 tablespoons raisins . 4 teaspoons sugar . 1 tablespoon honey Recheck blood glucose in 15 mins and repeat above if still symptomatic/blood glucose <100.  RECOMMENDATIONS TO REDUCE YOUR RISK OF DIABETIC COMPLICATIONS: * Take your prescribed MEDICATION(S) * Follow a  DIABETIC diet: Complex carbs, fiber rich foods, (monounsaturated and polyunsaturated) fats * AVOID saturated/trans fats, high fat foods, >2,300 mg salt per day. * EXERCISE at least 5 times a week for 30 minutes or preferably daily.  * DO NOT SMOKE OR DRINK more than 1 drink a day. * Check your FEET every day. Do not wear tightfitting shoes. Contact us if you develop an ulcer * See your EYE doctor once a year or more if needed * Get a FLU shot once a year * Get a PNEUMONIA vaccine once before and once after age 63 years  GOALS:  * Your Hemoglobin A1c of <7%  * fasting sugars need to be <130 * after meals sugars need to be <180 (2h after you start eating) * Your Systolic BP should be 140 or lower  * Your Diastolic BP should be 80 or lower  * Your HDL (Good Cholesterol) should be 40 or higher  * Your LDL (Bad Cholesterol) should be 100 or lower. * Your Triglycerides should be 150 or lower  * Your Urine microalbumin (kidney function) should be <30 * Your Body Mass Index should be 25 or lower    Please consider the following ways to cut down carbs and fat and increase fiber and micronutrients in your diet: - substitute whole grain for white bread or pasta - substitute brown rice for white rice - substitute 90-calorie flat bread pieces for slices of bread when possible - substitute sweet potatoes or yams for white potatoes - substitute humus for margarine - substitute tofu for cheese when possible - substitute almond or rice milk for  regular milk (would not drink soy milk daily due to concern for soy estrogen influence on breast cancer risk) - substitute dark chocolate for other sweets when possible - substitute water - can add lemon or orange slices for taste - for diet sodas (artificial sweeteners will trick your body that you can eat sweets without getting calories and will lead you to overeating and weight gain in the long run) - do not skip breakfast or other meals (this will slow down  the metabolism and will result in more weight gain over time)  - can try smoothies made from fruit and almond/rice milk in am instead of regular breakfast - can also try old-fashioned (not instant) oatmeal made with almond/rice milk in am - order the dressing on the side when eating salad at a restaurant (pour less than half of the dressing on the salad) - eat as little meat as possible - can try juicing, but should not forget that juicing will get rid of the fiber, so would alternate with eating raw veg./fruits or drinking smoothies - use as little oil as possible, even when using olive oil - can dress a salad with a mix of balsamic vinegar and lemon juice, for e.g. - use agave nectar, stevia sugar, or regular sugar rather than artificial sweateners - steam or broil/roast veggies  - snack on veggies/fruit/nuts (unsalted, preferably) when possible, rather than processed foods - reduce or eliminate aspartame in diet (it is in diet sodas, chewing gum, etc) Read the labels!  Try to read Dr. Janene Harvey book: "Program for Reversing Diabetes" for other ideas for healthy eating.

## 2019-08-06 ENCOUNTER — Ambulatory Visit (INDEPENDENT_AMBULATORY_CARE_PROVIDER_SITE_OTHER): Payer: BC Managed Care – PPO | Admitting: Psychiatry

## 2019-08-06 DIAGNOSIS — F5105 Insomnia due to other mental disorder: Secondary | ICD-10-CM | POA: Diagnosis not present

## 2019-08-06 DIAGNOSIS — F411 Generalized anxiety disorder: Secondary | ICD-10-CM | POA: Diagnosis not present

## 2019-08-06 DIAGNOSIS — E1100 Type 2 diabetes mellitus with hyperosmolarity without nonketotic hyperglycemic-hyperosmolar coma (NKHHC): Secondary | ICD-10-CM

## 2019-08-06 DIAGNOSIS — F322 Major depressive disorder, single episode, severe without psychotic features: Secondary | ICD-10-CM | POA: Diagnosis not present

## 2019-08-06 DIAGNOSIS — F99 Mental disorder, not otherwise specified: Secondary | ICD-10-CM
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
## 2019-08-11 ENCOUNTER — Telehealth: Payer: Self-pay | Admitting: Internal Medicine

## 2019-08-11 NOTE — Telephone Encounter (Signed)
Patients husband called stating that [facility] did not receive the refill for Continuous Blood Gluc Sensor (FREESTYLE LIBRE 2 SENSOR SYSTM) MISC  Please Advise, Thanks

## 2019-08-12 ENCOUNTER — Telehealth: Payer: Self-pay | Admitting: Internal Medicine

## 2019-08-12 DIAGNOSIS — E1165 Type 2 diabetes mellitus with hyperglycemia: Secondary | ICD-10-CM

## 2019-08-12 MED ORDER — FREESTYLE LIBRE 2 SENSOR SYSTM MISC: 1.0000 | 3 refills | Status: DC

## 2019-08-12 NOTE — Telephone Encounter (Signed)
Patient's husband Meagan Moran came into office requesting that the RX be resent due to [facility] telling patient they did not receive RX for: MEDICATION:  Sensors for the  Continuous Blood Gluc Sensor (FREESTYLE LIBRE 2 SENSOR SYSTM) MISC     HARMACY:   [id number] [place], [employer] [address] AT [address] [phone] (Phone) [phone] (Fax)     IS THIS A 90 DAY SUPPLY : ?  IS PATIENT OUT OF MEDICATION: Yes  IF NOT; HOW MUCH IS LEFT: 0  LAST APPOINTMENT DATE: @9 /16/2020  NEXT APPOINTMENT DATE:@10 /29/2020  DO WE HAVE YOUR PERMISSION TO LEAVE A DETAILED MESSAGE: Yes  OTHER COMMENTS:    **Let patient know to contact pharmacy at the end of the day to make sure medication is ready. **  ** Please notify patient to allow 48-72 hours to process**  **Encourage patient to contact the pharmacy for refills or they can request refills through [facility]**

## 2019-08-12 NOTE — Telephone Encounter (Signed)
I do not know why the pharmacy is telling them that because it clearly was sent:  Continuous Blood Gluc Sensor (FREESTYLE LIBRE 2 SENSOR SYSTM) MISC 6 each 3 08/05/2019    Sig - Route: 1 each by Does not apply route every 14 (fourteen) days. - Does not apply   Sent to pharmacy as: Continuous Blood Gluc Sensor (FREESTYLE LIBRE 2 SENSOR SYSTM) Misc   E-Prescribing Status: Receipt confirmed by pharmacy (08/05/2019 11:56 AM EDT)

## 2019-08-12 NOTE — Telephone Encounter (Signed)
I do not understand why they say they did not get it when our records clearly show they received it!  I will send it again.

## 2019-08-14 ENCOUNTER — Other Ambulatory Visit: Payer: Self-pay | Admitting: Psychiatry

## 2019-08-14 DIAGNOSIS — F411 Generalized anxiety disorder: Secondary | ICD-10-CM

## 2019-08-23 ENCOUNTER — Other Ambulatory Visit: Payer: Self-pay

## 2019-08-23 ENCOUNTER — Ambulatory Visit (INDEPENDENT_AMBULATORY_CARE_PROVIDER_SITE_OTHER): Payer: BC Managed Care – PPO | Admitting: Psychiatry

## 2019-08-23 DIAGNOSIS — M545 Low back pain, unspecified: Secondary | ICD-10-CM

## 2019-08-23 DIAGNOSIS — E1100 Type 2 diabetes mellitus with hyperosmolarity without nonketotic hyperglycemic-hyperosmolar coma (NKHHC): Secondary | ICD-10-CM

## 2019-08-23 DIAGNOSIS — Z630 Problems in relationship with spouse or partner: Secondary | ICD-10-CM

## 2019-08-23 DIAGNOSIS — F99 Mental disorder, not otherwise specified: Secondary | ICD-10-CM

## 2019-08-23 DIAGNOSIS — F411 Generalized anxiety disorder: Secondary | ICD-10-CM

## 2019-08-23 DIAGNOSIS — F5105 Insomnia due to other mental disorder: Secondary | ICD-10-CM | POA: Diagnosis not present

## 2019-08-23 DIAGNOSIS — F322 Major depressive disorder, single episode, severe without psychotic features: Secondary | ICD-10-CM

## 2019-08-23 DIAGNOSIS — G4733 Obstructive sleep apnea (adult) (pediatric): Secondary | ICD-10-CM

## 2019-08-23 NOTE — Progress Notes (Signed)
Psychotherapy Progress Note [facility] Meagan Czar, PhD LP  Patient ID: Meagan Moran     MRN: [id number]     Therapy format: Family therapy w/ patient -- accompanied by Larence Penning Date: 08/23/2019     Start: 11:16a Stop: 12:16p Time Spent: 60 min Location: in-person   Session narrative (presenting needs, interim history, self-report of stressors and symptoms, applications of prior therapy, status changes, and interventions made in session) H here to keep her honest, by his account.  Continues to work with Systems analyst, admits she has been advised to not work out so hard or so long because it drops her sugar too much.  Many other things where she goes too hard, wears down.  H c/o her asking repetitively about finances, and he has to wall her off.  PT notes H moved out of bedroom again.  He says she's been deteriorating the last two weeks since she failed to schedule and has not been in intensive 2/wk therapy but out of it 2 wks solid.  Overall impression of relapsing in Type A behaviors, pushing herself too hard, and H having to control his own anger over it.  H admits feeling off-put by her seeming refusal to do what is healthy.  Says he is sleeping in the other bedroom again because it's like there's a third person in the house -- her illness -- and it's "the alpha" now.  Prodded husband to define terms better with less impressionistic language, eventually able to articulate how frustrating it gets to see her disobey standards for getting rest, which he understands to be an allegedly agreed-upon requirement that she try to nap 12:30-5pm.  Asked why so much then, H says at different points she would be up pestering him with worrisome questions if they didn't have that rule, she will run herself ragged if she is up, and if she drives in the afternoon she gets lost, plus it is his understanding that her brain needs that kind of rest to heal from her depressive breakdown.  H admits  losing patience with it when PT gets up and does not rest as agreed, hints that he needs the space to control his own anger.    Assessed actual sleep schedule, conditions, and needs.  PT notes Trileptal is helping enforce night sleep, she is typically winding down 8p-9p, fall asleep 9-10p, and waking between 4-5a, for rough avg 7 hrs of night sleep.  Est 2-4 hrs. day sleep during these afternoon sequestrations.  H notes when he is not sleeping in the bedroom, she leaves TV on at night [employer] !), which is probably disturbing her, plus if she gets up, he can't stand to be disturbed by it.  Re-educated on the value of solid sleep -- already uses a CPAP, which may have a deteriorating seal --and the value of well-defined light and dark times both for sleep and for circadian antidepression.  Negotiated willingness to start tonight trying to sleep with white noise instead, at most a night light in bathroom.  Reluctantly agrees.  Reframed as a move that stands to benefit her greatly, not a rule handed down from high authority.  Clarified for H that the stress-damage interpretation of PT's sxs does not mean she needs prolonged periods of inactivity and bed rest; she needs to move back and forth more often between reasonable activity and reasonable rest, without having a sleep-spoiling nap to do it.  Educated on circadian principles and sleep  hygiene, recommending maximum 1 hour actual sleep daytime.  Framed the overall goal of increasing normalcy, meaning becoming able to be basically awake by day, up for some things without exhausting herself again, getting practice pacing, and progressively moving sleep back into nighttime only.  Also, for PT to trust her nerves again, and for H to trust her nerves to stay off his nerves, it requires seeing the opportunity for things to go wrong and having them go right instead.    Brokered agreement -- tentative, doubtful to H, but agreement -- for afternoon ground  rules: 4 hours not open to answering questions if they seem obsessive, honest attempt to get interspersed rest (1 hour down, 1 hour available to tasks, option whether to sleep or rest in other ways), and pledge not to drive beyond the immediate, familiar neighborhood.  Therapeutic modalities: Cognitive Behavioral Therapy, Solution-Oriented/Positive Psychology and Psycho-education/Bibliotherapy  Mental Status/Observations:  Appearance:   Casual     Behavior:  Appropriate  Motor:  Normal and stiff back  Speech/Language:   Clear and Coherent  Affect:  Appropriate  Mood:  anxious and wearied, less than before  Thought process:  normal  Thought content:    worries  Sensory/Perceptual disturbances:    WNL  Orientation:  grossly intact  Attention:  Good  Concentration:  Fair  Memory:  grossly intact  Insight:    Fair  Judgment:   Fair  Impulse Control:  Fair   Risk Assessment: Danger to Self: No Self-injurious Behavior: No Danger to Others: No Physical Aggression / Violence: No Duty to Warn: No Access to Firearms a concern: No  Assessment of progress:  progressing  Diagnosis:   ICD-10-CM   1. Current severe episode of major depressive disorder without psychotic features, unspecified whether recurrent ([place])  F32.2   2. Generalized anxiety disorder  F41.1   3. Insomnia due to other mental disorder  F51.05    F99   4. Type 2 diabetes mellitus with hyperosmolarity without coma, unspecified whether long term insulin use ([facility])  E11.00   5. Relationship problem between partners  Z63.0   6. Obstructive sleep apnea syndrome  G47.33   7. Low back pain, unspecified back pain laterality, unspecified chronicity, unspecified whether sciatica present  M54.5     Plan:  Marland Kitchen Afternoon plan above.  H has explicit right to remind her the "office is closed for questions" if compulsive questions come up, an to remind PT that the moment she is having obsessive doubt is the very moment she has the  opportunity to get stronger in resisting it, just like weight-lifting . Begin darker bedroom with white noise option tonight . Continue use of breathing skill to self-soothe . Seek further guidance PRN on best practices managing blood sugar . Other recommendations/advice as noted above . Continue to utilize previously learned skills ad lib . Maintain medication as prescribed and work faithfully with relevant prescriber(s) if any changes are desired or seem indicated . Call the clinic on-call service, present to ER, or call 911 if any life-threatening psychiatric crisis Return 1/week unti further notice.  [name], PhD [name], PhD LP Clinical Psychologist, [facility] [address], [place], [place] [address] [phone]

## 2019-09-01 ENCOUNTER — Ambulatory Visit (INDEPENDENT_AMBULATORY_CARE_PROVIDER_SITE_OTHER): Payer: BC Managed Care – PPO | Admitting: Psychiatry

## 2019-09-01 ENCOUNTER — Other Ambulatory Visit: Payer: Self-pay

## 2019-09-01 ENCOUNTER — Ambulatory Visit: Payer: BC Managed Care – PPO | Admitting: Psychiatry

## 2019-09-01 DIAGNOSIS — F322 Major depressive disorder, single episode, severe without psychotic features: Secondary | ICD-10-CM | POA: Diagnosis not present

## 2019-09-01 DIAGNOSIS — M545 Low back pain, unspecified: Secondary | ICD-10-CM

## 2019-09-01 DIAGNOSIS — F411 Generalized anxiety disorder: Secondary | ICD-10-CM | POA: Diagnosis not present

## 2019-09-01 DIAGNOSIS — Z630 Problems in relationship with spouse or partner: Secondary | ICD-10-CM

## 2019-09-01 DIAGNOSIS — G4700 Insomnia, unspecified: Secondary | ICD-10-CM
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
## 2019-09-08 ENCOUNTER — Other Ambulatory Visit: Payer: Self-pay

## 2019-09-08 ENCOUNTER — Ambulatory Visit (INDEPENDENT_AMBULATORY_CARE_PROVIDER_SITE_OTHER): Payer: BC Managed Care – PPO | Admitting: Psychiatry

## 2019-09-08 DIAGNOSIS — F322 Major depressive disorder, single episode, severe without psychotic features: Secondary | ICD-10-CM

## 2019-09-08 DIAGNOSIS — F411 Generalized anxiety disorder: Secondary | ICD-10-CM | POA: Diagnosis not present

## 2019-09-08 DIAGNOSIS — M545 Low back pain, unspecified: Secondary | ICD-10-CM

## 2019-09-08 DIAGNOSIS — E1100 Type 2 diabetes mellitus with hyperosmolarity without nonketotic hyperglycemic-hyperosmolar coma (NKHHC): Secondary | ICD-10-CM

## 2019-09-08 DIAGNOSIS — Z630 Problems in relationship with spouse or partner: Secondary | ICD-10-CM

## 2019-09-08 DIAGNOSIS — F99 Mental disorder, not otherwise specified: Secondary | ICD-10-CM

## 2019-09-08 DIAGNOSIS — F5105 Insomnia due to other mental disorder: Secondary | ICD-10-CM

## 2019-09-08 NOTE — Progress Notes (Signed)
Psychotherapy Progress Note [facility] Marliss Czar, PhD LP  Patient ID: Meagan Moran     MRN: [id number]     Therapy format: Individual psychotherapy Date: 09/08/2019     Start: 8:15a Stop: 9:05a Time Spent: 50 min Location: in-person   Session narrative (presenting needs, interim history, self-report of stressors and symptoms, applications of prior therapy, status changes, and interventions made in session) More stress with mother and husband.  H still sleeping separately, PT still tired of feeling overdirected, but doing more negotiating activities.  Worries some over what to make for dinner, no mention of worrying over money and business but usually part of it.  Trying to get H to understand she can't always exercise the way he wants her to in the mornings b/c it allegedly crashed her blood sugar (?).  Seems to be after she delivers insulin, too, so could be combined effect and/or overtreating b.s.  Advised she consider waiting to calculate insulin until after exercise and re-measure her level.  With mother, having trouble remembering the good times.  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  Trouble with telling herself she shouldn't feel these things, and with some absorption into memories of hard times.  Encouraged to allow memories, try more to balance the scales than control negative memory.  Now considering filing for disability due to severe and persistent back pain.  It has been persistent over time, and as depression has added to it and she is near scheduled retirement age, it's more of a possibility.  Doesn't like having to ask H to help her get up.  Encouraged in back care and letting what must be asked be asked.  "Allergies are killing me."  Doing Benadryl, which can be drowsy-making.  Suggested ask doctor about non-drowsy alternatives, since mental acuity has been a point of anxiety.  Re. worry,  refreshed advice to practice asking self what if [the good outcome] happens, to balance the scales.    Re. Activity and need for help, reframed from attention to "having to" report to someone to being "in good hands", a la the old [employer] commercials.  Also encouraged to reframe needing help, e.g., with getting up to being in progress recovering function.  Re. Preoccupations with mother and bad memories, recommended grounding (inventory sights and sounds outside of her own head, hold ice cubes).  Also offered, as needed, envision herself as an adult, re-entering scenes of youth to either comfort her younger self or intervene on her behalf.  [name] wants to add a 5th rental home to their portfolio.  Some worry about whether they can manage the finances, since they no longer make great sense to her.  Encouraged to share, ask questions as needed.  Therapeutic modalities: Cognitive Behavioral Therapy, Solution-Oriented/Positive Psychology and Ego-Supportive  Mental Status/Observations:  Appearance:   Casual     Behavior:  Appropriate  Motor:  restrained some with pain  Speech/Language:   Clear and Coherent and mildy slowed  Affect:  Appropriate  Mood:  anxious, depressed and responsive  Thought process:  Tangential at times, overinclusive  Thought content:    WNL and worry  Sensory/Perceptual disturbances:    WNL  Orientation:  grossly intact  Attention:  Good  Concentration:  Fair  Memory:  grossly intact  Insight:    Good  Judgment:   Good  Impulse Control:  Fair   Risk Assessment: Danger to Self:  No Self-injurious Behavior: No Danger to Others: No Physical Aggression / Violence: No Duty to Warn: No Access to Firearms a concern: No  Assessment of progress:  progressing  Diagnosis:   ICD-10-CM   1. Current severe episode of major depressive disorder without psychotic features, unspecified whether recurrent ([place])  F32.2    improving  2. Generalized anxiety disorder  F41.1   3.  Relationship problem between partners  Z63.0   4. Low back pain, unspecified back pain laterality, unspecified chronicity, unspecified whether sciatica present  M54.5   5. Insomnia due to other mental disorder  F51.05    F99   6. Type 2 diabetes mellitus with hyperosmolarity without coma, unspecified whether long term insulin use ([name])  E11.00     Plan:  . Continue self-help efforts for sleep, pain, stamina, flexibility, and normal circadian rhythm . See about waiting to treat morning blood sugar; if exercising early, see what benefit before dosing insulin, provided it fits physician's guidance . For bad memories, practice either grounding (e.g. ice cubes) or balancing memories . Other recommendations/advice as noted above . Continue to utilize previously learned skills ad lib . Maintain medication as prescribed and work faithfully with relevant prescriber(s) if any changes are desired or seem indicated . Call the clinic on-call service, present to ER, or call 911 if any life-threatening psychiatric crisis Return in about 1 week (around 09/15/2019).  [name], PhD [name], PhD LP Clinical Psychologist, [facility] [address] [place], [place] [address] [phone]

## 2019-09-15 ENCOUNTER — Other Ambulatory Visit: Payer: Self-pay

## 2019-09-15 ENCOUNTER — Ambulatory Visit (INDEPENDENT_AMBULATORY_CARE_PROVIDER_SITE_OTHER): Payer: BC Managed Care – PPO | Admitting: Psychiatry

## 2019-09-15 DIAGNOSIS — F411 Generalized anxiety disorder: Secondary | ICD-10-CM

## 2019-09-15 DIAGNOSIS — E1100 Type 2 diabetes mellitus with hyperosmolarity without nonketotic hyperglycemic-hyperosmolar coma (NKHHC): Secondary | ICD-10-CM

## 2019-09-15 DIAGNOSIS — M545 Low back pain, unspecified: Secondary | ICD-10-CM

## 2019-09-15 DIAGNOSIS — F99 Mental disorder, not otherwise specified: Secondary | ICD-10-CM

## 2019-09-15 DIAGNOSIS — F5105 Insomnia due to other mental disorder: Secondary | ICD-10-CM

## 2019-09-15 DIAGNOSIS — F322 Major depressive disorder, single episode, severe without psychotic features: Secondary | ICD-10-CM | POA: Diagnosis not present

## 2019-09-15 DIAGNOSIS — Z630 Problems in relationship with spouse or partner: Secondary | ICD-10-CM | POA: Diagnosis not present

## 2019-09-15 NOTE — Progress Notes (Signed)
Psychotherapy Progress Note [facility] [name], PhD LP  Patient ID: Meagan Moran     MRN: [id number]     Therapy format: Individual psychotherapy Date: 09/15/2019     Start: 8:16a Stop: 9:06a Time Spent: 50 min Location: in-person   Session narrative (presenting needs, interim history, self-report of stressors and symptoms, applications of prior therapy, status changes, and interventions made in session) H seen yesterday, prompted a discussion at home about his mother, faith, [place] values, and some healthy conflict about how H handles her recovery process, including renegotiating down times.  Walking regularly now often enough with H.  Electing to reduce strenuous exercise in the morning due to sugar crashes (which I suspect are still coming from overestimating insulin).  H back in bed a/o last night.  His FM flaring with fall weather.  Making more use of prayer.  Exercising her own choice over whether to sleep in afternoons.  Clear between them no divorce, just good attention to when they need a break from each other and are able to come back.  Says H is urging her to approach prescriber for medication to improve concentration, and maybe sleep.  Says here that she is sleeping better -- not so much up & down, less checking to see if she got things taken care of.  Working on adjusting her alarm from the old 5am to 7am.  Encouraged continue using visualization of Jesus holding her worries for the night, maybe add an object -- bowl, envelope, box, something -- to put them in.  Very favorable to the idea.  Wishes husband shared her faith, and politics, more but not inclined to push.  Has 2nd session in 2 days -- wants to establish that H will let her help him with his FM, but arguably this   Therapeutic modalities: Cognitive Behavioral Therapy, Solution-Oriented/Positive Psychology and Ego-Supportive  Mental Status/Observations:  Appearance:   Casual and clothes baggy  from weight loss     Behavior:  Appropriate  Motor:  Normal and exc pain  Speech/Language:   Clear and Coherent  Affect:  Appropriate  Mood:  improving, less anx/dep  Thought process:  normal  Thought content:    WNL, worries  Sensory/Perceptual disturbances:    WNL  Orientation:  grossly intact  Attention:  Good  Concentration:  Fair  Memory:  grossly intact  Insight:    Good  Judgment:   Good  Impulse Control:  Good   Risk Assessment: Danger to Self: No Self-injurious Behavior: No Danger to Others: No Physical Aggression / Violence: No Duty to Warn: No Access to Firearms a concern: No  Assessment of progress:  progressing  Diagnosis:   ICD-10-CM   1. Current severe episode of major depressive disorder without psychotic features, unspecified whether recurrent ([place])  F32.2   2. Generalized anxiety disorder  F41.1   3. Relationship problem between partners  Z63.0   4. Type 2 diabetes mellitus with hyperosmolarity without coma, unspecified whether long term insulin use (HCC)  E11.00   5. Low back pain, unspecified back pain laterality, unspecified chronicity, unspecified whether sciatica present  M54.5   6. Insomnia due to other mental disorder  F51.05    F99     Plan:  . Clarify blood sugar control strategy with endocrinologist -- sees next week, [place] agreed to pass along information about "chasing" blood sugars.  Meanwhile, try to do less than tempted to do, both in taking in sugar and in dosing insulin .  Continue positively negotiating freedom to rest or do something of interest rather than sleep, and keep it honest if disagreeing -- H understands and respects forthright disagreement, and important for transcending FOO dynamics and undoing dysfunction the couple got into with "yes, sir" behavior  . Continue use of prayer and activity pacing for comfort and pain management . Continue aligning sleep time for best benefit  . Option to stretch actively if not engaging full-on  exercise in the morning -- may be substantial benefit out of freeing up circulation and musculature . Other recommendations/advice as noted above Remains disabled from any occupation due to symptom severity. . Continue to utilize previously learned skills ad lib . Maintain medication as prescribed and work faithfully with relevant prescriber(s) if any changes are desired or seem indicated . Call the clinic on-call service, present to ER, or call 911 if any life-threatening psychiatric crisis Return in about 1 week (around 09/22/2019).  Robley Fries, PhD Marliss Czar, PhD LP Clinical Psychologist, [facility] [address], [address] [place], [place] [address] [phone]

## 2019-09-17 ENCOUNTER — Ambulatory Visit (INDEPENDENT_AMBULATORY_CARE_PROVIDER_SITE_OTHER): Payer: BC Managed Care – PPO | Admitting: Psychiatry

## 2019-09-17 ENCOUNTER — Other Ambulatory Visit: Payer: Self-pay

## 2019-09-17 DIAGNOSIS — F411 Generalized anxiety disorder: Secondary | ICD-10-CM | POA: Diagnosis not present

## 2019-09-17 DIAGNOSIS — F322 Major depressive disorder, single episode, severe without psychotic features: Secondary | ICD-10-CM | POA: Diagnosis not present

## 2019-09-17 DIAGNOSIS — E1100 Type 2 diabetes mellitus with hyperosmolarity without nonketotic hyperglycemic-hyperosmolar coma (NKHHC): Secondary | ICD-10-CM | POA: Diagnosis not present

## 2019-09-17 DIAGNOSIS — M545 Low back pain, unspecified: Secondary | ICD-10-CM

## 2019-09-17 DIAGNOSIS — Z630 Problems in relationship with spouse or partner: Secondary | ICD-10-CM | POA: Diagnosis not present

## 2019-09-17 NOTE — Progress Notes (Signed)
Psychotherapy Progress Note [facility] Marliss Czar, PhD LP  Patient ID: Meagan Moran     MRN: [id number]     Therapy format: Individual psychotherapy Date: 09/17/2019     Start: 8:12a Stop: 9:03a Time Spent: 51 min Location: in-person   Session narrative (presenting needs, interim history, self-report of stressors and symptoms, applications of prior therapy, status changes, and interventions made in session) 2nd session this week, just 2 days later, at husband's (arguably irrational) urging that she have 2/wk.  Believes she is making progress getting him to see improvement and lighten up.  Fatigued this morning, believes she has more of an understanding not to overdo exercise, and a better balance of chores (a bit more to H).  Both dealing with chronic pain right now, his fibromyalgia, her back issues.  Got agreement to buy a few clothes now that her size is so much reduced.  H still wants substantial carbs, which are not the best idea for his health.  Walks together seem helpful -- out of the house, together, able to slow down when his FM aggravates.  Remain in bed together, 3 nights now.  Knows she can stress too much about the next thing to do, dinner plans, whether she forgot something.  Concentration "fair", slipped on the floor (momentary balance issue).    Trying to back off on mouth-drying med (oxcarbazepine).  Was taking 2 a day (both at bedtime), backed it down to 1 for about a month now (as prescribed).  Will want to discuss next week with Ms Montez Morita.  Framed likely priorities for medication as (1) wean Trileptal, (2) reconcile Xanax (little used) + Buspar.    Discussed communication & triangling in the family (MIL badgering H to take care of her).  Advised just let MIL know she needs less "help".  With H, advised back off repeating her own urging to see his M -- he's heard it all already, it's a favor to let him ride with it and agree to disagree, or just let it be  his prerogative.   Finds herself pretending to sleep sometimes (ostensibly to ward off [name] getting worried/irritated).  Advised keep it honest -- quiet is OK if it helps, doesn't have to be -- or seem to be -- sleep, keep working shared understanding.    Re. diabetes, gets 500 b.s. in the morning about 1/wk, tends to over-treat it with insulin but sugar up (cranberry juice) before workout (for fear of crashing it).  Next week sees endocrinologist.  Re. Back pain, being careful not to slip in work in back yard.   Morning routine involves coffee and a walk around at [employer].    Re. harshness with herself, is catching herself "being silly".  Trying to practice treating herself as she would her daughter.    Therapeutic modalities: Cognitive Behavioral Therapy, Solution-Oriented/Positive Psychology and Ego-Supportive  Mental Status/Observations:  Appearance:   Casual     Behavior:  Appropriate  Motor:  Normal  Speech/Language:   Clear and Coherent  Affect:  Appropriate  Mood:  anxious, depressed and continues more moderate  Thought process:  normal  Thought content:    WNL  Sensory/Perceptual disturbances:    WNL  Orientation:  grossly intact  Attention:  Good  Concentration:  Good  Memory:  WNL  Insight:    Good  Judgment:   Good  Impulse Control:  Good   Risk Assessment: Danger to Self: No Self-injurious Behavior: No Danger to Others:  No Physical Aggression / Violence: No Duty to Warn: No Access to Firearms a concern: No  Assessment of progress:  progressing  Diagnosis:   ICD-10-CM   1. Current severe episode of major depressive disorder without psychotic features, unspecified whether recurrent ([place])  F32.2   2. Generalized anxiety disorder  F41.1   3. Relationship problem between partners  Z63.0   4. Type 2 diabetes mellitus with hyperosmolarity without coma, unspecified whether long term insulin use (HCC)  E11.00   5. Low back pain, unspecified back pain laterality,  unspecified chronicity, unspecified whether sciatica present  M54.5     Plan:  . Resolved -- OK to sleep or rest, as needed get H's understanding . Re. Family communication -- let MIL know to back off on representing her to H, it backfires when she pushes H about taking care of her; with [name], catch self repeating about seeing his mother, let it be his prerogative how he handles his relationship with M . Re diabetes -- try to "chase" less sugar and insulin & consult endocrinology next week. . Other recommendations/advice as noted above . Continue to utilize previously learned skills ad lib . Maintain medication as prescribed and work faithfully with relevant prescriber(s) if any changes are desired or seem indicated . Call the clinic on-call service, present to ER, or call 911 if any life-threatening psychiatric crisis Return in about 1 week (around 09/24/2019).  Meagan Serve, PhD [name], PhD LP Clinical Psychologist, [facility] [address], [place], [place] [address] [phone]

## 2019-09-20 ENCOUNTER — Ambulatory Visit (INDEPENDENT_AMBULATORY_CARE_PROVIDER_SITE_OTHER): Payer: BC Managed Care – PPO | Admitting: Psychiatry

## 2019-09-20 ENCOUNTER — Other Ambulatory Visit: Payer: Self-pay

## 2019-09-20 ENCOUNTER — Encounter: Payer: Self-pay | Admitting: Psychiatry

## 2019-09-20 VITALS — Wt 218.0 lb

## 2019-09-20 DIAGNOSIS — F39 Unspecified mood [affective] disorder: Secondary | ICD-10-CM | POA: Diagnosis not present

## 2019-09-20 DIAGNOSIS — F411 Generalized anxiety disorder: Secondary | ICD-10-CM | POA: Diagnosis not present

## 2019-09-20 DIAGNOSIS — F5105 Insomnia due to other mental disorder: Secondary | ICD-10-CM

## 2019-09-20 DIAGNOSIS — F99 Mental disorder, not otherwise specified: Secondary | ICD-10-CM

## 2019-09-20 MED ORDER — BUSPIRONE HCL 30 MG PO TABS: 30.0000 mg | ORAL_TABLET | Freq: Two times a day (BID) | ORAL | 1 refills | Status: DC

## 2019-09-20 MED ORDER — OXCARBAZEPINE 300 MG PO TABS: 300.0000 mg | ORAL_TABLET | Freq: Every day | ORAL | 0 refills | Status: DC

## 2019-09-20 MED ORDER — DULOXETINE HCL 60 MG PO CPEP: 60.0000 mg | ORAL_CAPSULE | Freq: Every day | ORAL | 0 refills | Status: DC

## 2019-09-20 MED ORDER — BUSPIRONE HCL 15 MG PO TABS: ORAL_TABLET | ORAL | 0 refills | Status: DC

## 2019-09-20 NOTE — Progress Notes (Signed)
Meagan Moran [id number] Feb 20, 1956 63 y.o.  Subjective:   Patient ID:  Meagan Moran is a 63 y.o. (DOB 1956/06/24) female.  Chief Complaint:  Chief Complaint  Patient presents with  . Insomnia  . Anxiety  . Depression    HPI Meagan Moran presents to the office today for follow-up of anxiety, mood disturbance, and insomnia. She reports that her mood is "alright for the most part" with occasional crying episodes. Reports that sad mood typically is brief in duration (a couple of hours to less than a day). She reports that she continues to experience some anxiety, such as worry about what she will do next, what food to prepare, etc. She reports that she occ experiences increased heart rate, increased BP, and some mild nausea with severe anxiety every few days. Denies any recent full blown panic attacks   She reports that her husband wanted her to report that her "nap time is good" but sleep is interrupted at night and sleeping about 4-6 hours a night total. Awakened by pain at times. Estimates sleeping 3-4 hours during the day. Reports having dreams about her father. She is continuing to exercise in the mornings. She reports that she is continuing to intentionally lose weight. She reports that her appetite is good and she constantly feels hungry. Energy and motivation are fair. She reports that her concentration has improved and her husband has also commented that her concentration has improved. Denies SI.   Denies any impulsive or risky behavior.  Reports that she and her husband are now sleeping in the same bed again and she is happy about this. She reports that husband has designated some chores as his and she reports that she would prefer to so some of these jobs.   Husband recently had biopsy and awaiting results.   Past Psychiatric Medication Trials: Cymbalta- Effective for anxiety at 60 mg po qd.  Prozac- side effects Wellbutrin- side effects Trileptal- Effective. Caused  nightmares. Was taking 300 mg po BID.  Review of Systems:  Review of Systems  HENT: Positive for congestion.   Gastrointestinal: Positive for constipation and diarrhea.  Endocrine:       She reports frequent fluctuations in glucose levels. Has upcoming apt with endocrinologist.   Musculoskeletal: Negative for gait problem.  Psychiatric/Behavioral:       Please refer to HPI    Medications: I have reviewed the patient's current medications.  Current Outpatient Medications  Medication Sig Dispense Refill  . ALPRAZolam (XANAX) 0.5 MG tablet TAKE 1 TABLET EVERY 12 HOURS AS NEEDED FOR ANXIETY    . amLODipine (NORVASC) 5 MG tablet TAKE 1 TABLET EVERY DAY AT LUNCH    . busPIRone (BUSPAR) 15 MG tablet Take 1.5 tabs po BID for one week, then increase to 2 tabs po BID 180 tablet 0  . Continuous Blood Gluc Sensor (FREESTYLE LIBRE 2 SENSOR SYSTM) MISC 1 each by Does not apply route every 14 (fourteen) days. 6 each 3  . DULoxetine (CYMBALTA) 60 MG capsule Take 1 capsule (60 mg total) by mouth daily. 90 capsule 0  . HYDROcodone-acetaminophen (NORCO) 7.5-325 MG tablet     . losartan (COZAAR) 100 MG tablet Take 100 mg by mouth daily.    . metFORMIN (GLUCOPHAGE) 500 MG tablet Take 1 tablet (500 mg total) by mouth daily with breakfast. 90 tablet 3  . Omega-3 Fatty Acids (FISH OIL PO) Take by mouth.    . Oxcarbazepine (TRILEPTAL) 300 MG tablet Take 1 tablet (300  mg total) by mouth at bedtime. 90 tablet 0  . pravastatin (PRAVACHOL) 20 MG tablet Take 20 mg by mouth daily.    Marland Kitchen tiZANidine (ZANAFLEX) 4 MG tablet TAKE 1 TABLET 4 TIMES A DAY AS NEEDED FOR MUSCLE SPASMS    . VITAMIN D PO Take by mouth.    . busPIRone (BUSPAR) 30 MG tablet Take 1 tablet (30 mg total) by mouth 2 (two) times daily. 60 tablet 1  . Continuous Blood Gluc Receiver (FREESTYLE LIBRE 2 READER SYSTM) DEVI 1 each by Does not apply route once for 1 dose. 1 each 0  . glucagon (GLUCAGON EMERGENCY) 1 MG injection Inject 1 mg into the muscle  once as needed for up to 1 dose. 1 each 12  . hydrochlorothiazide (MICROZIDE) 12.5 MG capsule Take 12.5 mg by mouth daily as needed.     No current facility-administered medications for this visit.     Medication Side Effects: None  Allergies:  Allergies  Allergen Reactions  . Nsaids   . Sulfa Antibiotics     Past Medical History:  Diagnosis Date  . Back pain   . Diabetes mellitus, type II (HCC)   . Heart murmur   . Sleep apnea     Family History  Problem Relation Age of Onset  . Anxiety disorder Mother   . Anxiety disorder Maternal Grandmother   . Anxiety disorder Daughter     Social History   Socioeconomic History  . Marital status: Married    Spouse name: Not on file  . Number of children: Not on file  . Years of education: Not on file  . Highest education level: Not on file  Occupational History  . Not on file  Social Needs  . Financial resource strain: Not on file  . Food insecurity    Worry: Not on file    Inability: Not on file  . Transportation needs    Medical: Not on file    Non-medical: Not on file  Tobacco Use  . Smoking status: Current Some Day Smoker    Types: Cigarettes  . Smokeless tobacco: Never Used  Substance and Sexual Activity  . Alcohol use: Not Currently    Comment: "only on the holidays"  . Drug use: Not Currently  . Sexual activity: Not on file  Lifestyle  . Physical activity    Days per week: Not on file    Minutes per session: Not on file  . Stress: Not on file  Relationships  . Social Musician on phone: Not on file    Gets together: Not on file    Attends religious service: Not on file    Active member of club or organization: Not on file    Attends meetings of clubs or organizations: Not on file    Relationship status: Not on file  . Intimate partner violence    Fear of current or ex partner: Not on file    Emotionally abused: Not on file    Physically abused: Not on file    Forced sexual activity: Not  on file  Other Topics Concern  . Not on file  Social History Narrative  . Not on file    Past Medical History, Surgical history, Social history, and Family history were reviewed and updated as appropriate.   Please see review of systems for further details on the patient's review from today.   Objective:   Physical Exam:  Wt 218 lb (98.9 kg)  BMI 29.57 kg/m   Physical Exam Constitutional:      General: She is not in acute distress.    Appearance: She is well-developed.  Musculoskeletal:        General: No deformity.  Neurological:     Mental Status: She is alert and oriented to person, place, and time.     Coordination: Coordination normal.  Psychiatric:        Attention and Perception: Attention and perception normal. She does not perceive auditory or visual hallucinations.        Mood and Affect: Mood is anxious. Mood is not depressed. Affect is not labile, blunt, angry or inappropriate.        Speech: Speech normal.        Behavior: Behavior normal.        Thought Content: Thought content normal. Thought content is not paranoid or delusional. Thought content does not include homicidal or suicidal ideation. Thought content does not include homicidal or suicidal plan.        Cognition and Memory: Cognition and memory normal.        Judgment: Judgment normal.     Comments: Patient presents as less anxious compared to previous exams.  Thought processes are more linear and goal-directed. Insight intact. No delusions.      Lab Review:  No results found for: NA, K, CL, CO2, GLUCOSE, BUN, CREATININE, CALCIUM, PROT, ALBUMIN, AST, ALT, ALKPHOS, BILITOT, GFRNONAA, GFRAA  No results found for: WBC, RBC, HGB, HCT, PLT, MCV, MCH, MCHC, RDW, LYMPHSABS, MONOABS, EOSABS, BASOSABS  No results found for: POCLITH, LITHIUM   No results found for: PHENYTOIN, PHENOBARB, VALPROATE, CBMZ   .res Assessment: Plan:   Recommended trying to decrease amount of sleep during the day to improve  sleep at night since patient reports that some days she is sleeping up to 3 to 4 hours during the daytime.  Discussed potential benefits, risks, and side effects of increasing BuSpar since patient reports that she has experienced some improvement in anxiety, however she continues to have frequent anxiety.  Patient also indicates that anxiety may be a factor with middle of the night awakenings. Will therefore increase BuSpar to 15 mg 1-1/2 tabs p.o. twice daily for 1 week, then increase to BuSpar 30 mg twice daily for anxiety. Continue Cymbalta 60 mg daily for depression and anxiety. Continue Trileptal 300 mg at bedtime since this has been helpful for anxiety, mood lability, and insomnia. Recommend continuing psychotherapy with Marliss CzarAndy Mitchum, PhD. Patient to follow-up with this provider in 6 to 8 weeks or sooner if clinically indicated. Patient advised to contact office with any questions, adverse effects, or acute worsening in signs and symptoms.  Meagan Moran was seen today for insomnia, anxiety and depression.  Diagnoses and all orders for this visit:  Insomnia due to other mental disorder -     Oxcarbazepine (TRILEPTAL) 300 MG tablet; Take 1 tablet (300 mg total) by mouth at bedtime.  Generalized anxiety disorder -     busPIRone (BUSPAR) 15 MG tablet; Take 1.5 tabs po BID for one week, then increase to 2 tabs po BID -     busPIRone (BUSPAR) 30 MG tablet; Take 1 tablet (30 mg total) by mouth 2 (two) times daily. -     DULoxetine (CYMBALTA) 60 MG capsule; Take 1 capsule (60 mg total) by mouth daily. -     Oxcarbazepine (TRILEPTAL) 300 MG tablet; Take 1 tablet (300 mg total) by mouth at bedtime.  Episodic mood disorder (HCC) -  Oxcarbazepine (TRILEPTAL) 300 MG tablet; Take 1 tablet (300 mg total) by mouth at bedtime.     Please see After Visit Summary for patient specific instructions.  Future Appointments  Date Time Provider Department Center  09/22/2019  8:00 AM Robley Fries, PhD [facility]  None  09/23/2019 11:00 AM Carlus Pavlov, MD [facility] None  09/24/2019  8:00 AM Robley Fries, PhD [facility] None  09/29/2019 10:00 AM Robley Fries, PhD [facility] None  10/01/2019 10:00 AM Robley Fries, PhD [facility] None  10/05/2019  8:00 AM Robley Fries, PhD [facility] None  10/08/2019  8:00 AM Robley Fries, PhD [facility] None  10/12/2019  8:00 AM Robley Fries, PhD [facility] None  10/15/2019  8:00 AM Robley Fries, PhD [facility] None  10/19/2019  8:00 AM Robley Fries, PhD [facility] None  10/26/2019  8:00 AM Robley Fries, PhD [facility] None  10/29/2019  8:00 AM Robley Fries, PhD [facility] None  11/08/2019  9:00 AM Corie Chiquito, PMHNP [facility] None    No orders of the defined types were placed in this encounter.   -------------------------------

## 2019-09-22 ENCOUNTER — Other Ambulatory Visit: Payer: Self-pay

## 2019-09-22 ENCOUNTER — Ambulatory Visit (INDEPENDENT_AMBULATORY_CARE_PROVIDER_SITE_OTHER): Payer: BC Managed Care – PPO | Admitting: Psychiatry

## 2019-09-22 DIAGNOSIS — M545 Low back pain, unspecified: Secondary | ICD-10-CM

## 2019-09-22 DIAGNOSIS — Z630 Problems in relationship with spouse or partner: Secondary | ICD-10-CM

## 2019-09-22 DIAGNOSIS — F5105 Insomnia due to other mental disorder: Secondary | ICD-10-CM | POA: Diagnosis not present

## 2019-09-22 DIAGNOSIS — F322 Major depressive disorder, single episode, severe without psychotic features: Secondary | ICD-10-CM

## 2019-09-22 DIAGNOSIS — F411 Generalized anxiety disorder: Secondary | ICD-10-CM | POA: Diagnosis not present

## 2019-09-22 DIAGNOSIS — E1100 Type 2 diabetes mellitus with hyperosmolarity without nonketotic hyperglycemic-hyperosmolar coma (NKHHC): Secondary | ICD-10-CM

## 2019-09-22 DIAGNOSIS — F99 Mental disorder, not otherwise specified: Secondary | ICD-10-CM

## 2019-09-22 NOTE — Progress Notes (Signed)
Psychotherapy Progress Note [facility] Marliss Czar, PhD LP  Patient ID: Meagan Moran     MRN: [id number]     Therapy format: Individual psychotherapy Date: 09/22/2019     Start: 8:12a Stop: 9:00a Time Spent: 48 min Location: in-person   Session narrative (presenting needs, interim history, self-report of stressors and symptoms, applications of prior therapy, status changes, and interventions made in session) Sleep eroded after tensions last evening, says she "wouldn't leave him alone".  Eventually worked through a maybe-sleep-separately tension, maintained same bedroom.  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  He is also in seasonal fibro flareup, wants carbs, irritable, and compulsively shares political outrage stories with her.  Have shared his medical news with the kids, though PT got concerned that he wanted to call them back later after an update call, thought it was too much.  Discussed tendencies of Pt and H to pay a lot of attention to worrying over what kind of mistake or "crazy" behavior the other one will get into, when they each have their places to be headstrong.  Suggested she try to just let [name] be [name] hen it comes to communicating with the kids and save the urging for things that matter more, like listening openly to his doctor and the limits of being told distressing things.  Re. diabetes, endocrinology appt tomorrow.  Continuing concern about possibly "chasing" sugar and insulin in the mornings.  Advised be clear not to overtreat either.   Re. sleep, can still wake up early.  Alarm set for 6am, which may be too early still, but 9pm bedtime, awakes 4-5am,  Nap restraining to about 2-4pm.    Re. family boundaries, has settled better that [name] can make his own call about how to  communicate with his own family.  She did, circuitously, make the point to MIL not to push [name], but not necessarily clear she asked her to refrain from pushing him about taking care of herself.  Encouraged be ready to clarify to MIL specifically about pushing H to take care of her (i.e., it backfires, she's doing OK, he's doing OK, rather than leading with how much she and he are both suffering).  Pointed out that if MIL is the worry wart both [name] and PT describe, she may only hear and feel the need to intervene, and her reflex is to prod [name] in ways that only annoy him and have him to the point where he is not sure he cares to go to her funeral.  Re. concentration and stress, challenged about switching attention between urgent things, goals being to switch less, accept "urgent" less.  Has successfully let husband work finances, she says.    Therapeutic modalities: Cognitive Behavioral Therapy, Solution-Oriented/Positive Psychology and Ego-Supportive  Mental Status/Observations:  Appearance:   Casual     Behavior:  Appropriate  Motor:  Normal and slowed for pain  Speech/Language:   Slow  Affect:  Depressed  Mood:  anxious and depressed  Thought process:  some flooding  Thought content:    WNL and worries  Sensory/Perceptual disturbances:    WNL  Orientation:  grossly intact  Attention:  Fair  Concentration:  Fair  Memory:  grossly intact  Insight:    Fair  Judgment:   Fair  Impulse Control:  Good  Risk Assessment: Danger to Self: No Self-injurious Behavior: No Danger to Others: No Physical Aggression / Violence: No Duty to Warn: No Access to Firearms a concern: No  Assessment of progress:  situational setback(s)  Diagnosis:   ICD-10-CM   1. Current severe episode of major depressive disorder without psychotic features, unspecified whether recurrent ([place])  F32.2   2. Generalized anxiety disorder  F41.1   3. Insomnia due to other mental disorder  F51.05    F99   4.  Relationship problem between partners  Z63.0   5. Type 2 diabetes mellitus with hyperosmolarity without coma, unspecified whether long term insulin use (HCC)  E11.00   6. Low back pain, unspecified back pain laterality, unspecified chronicity, unspecified whether sciatica present  M54.5     Plan:  . Try to retrain naps to 1 hour . Don't "chase" blood sugar up and down -- be sure of need, try doing half the remedy she thinks she needs  . Keep letting [name] make the call how to communicate with his own family . Continue using breathing technique ad lib for coping with anxiety  . Continuing consent to see both PT and H in mixture of individual and joint sessions . Other recommendations/advice as noted above . Continue to utilize previously learned skills ad lib . Maintain medication as prescribed and work faithfully with relevant prescriber(s) if any changes are desired or seem indicated . Call the clinic on-call service, present to ER, or call 911 if any life-threatening psychiatric crisis No follow-ups on file.  [name], PhD [name], PhD LP Clinical Psychologist, [facility] [address] [place], [place] [address] [phone]

## 2019-09-23 ENCOUNTER — Encounter: Payer: Self-pay | Admitting: Internal Medicine

## 2019-09-23 ENCOUNTER — Ambulatory Visit: Payer: BC Managed Care – PPO | Admitting: Internal Medicine

## 2019-09-23 VITALS — BP 150/82 | HR 82 | Ht 72.0 in | Wt 218.6 lb

## 2019-09-23 DIAGNOSIS — E1165 Type 2 diabetes mellitus with hyperglycemia: Secondary | ICD-10-CM

## 2019-09-23 DIAGNOSIS — E782 Mixed hyperlipidemia: Secondary | ICD-10-CM

## 2019-09-23 DIAGNOSIS — E663 Overweight: Secondary | ICD-10-CM | POA: Diagnosis not present

## 2019-09-23 DIAGNOSIS — Z794 Long term (current) use of insulin: Secondary | ICD-10-CM | POA: Diagnosis not present

## 2019-09-23 LAB — POCT GLYCOSYLATED HEMOGLOBIN (HGB A1C): Hemoglobin A1C: 6.2 % — AB (ref 4.0–5.6)

## 2019-09-23 NOTE — Progress Notes (Signed)
Patient ID: Meagan Moran, female   DOB: 10-Oct-1956, 63 y.o.   MRN: [id number]   HPI: Meagan Moran is a 63 y.o.-year-old female, initially referred by her PCP, Dr. Merri Brunette, returning for follow-up for DM2, dx in 2005, prev. insulin-dependent, uncontrolled, without long-term complications. Last visit  1.5 mo ago.  Reviewed latest HbA1c level: 07/01/2019: HbA1c 6.9% 09/16/2018: HbA1c 7.1% No results found for: HGBA1C  Pt is on a regimen of: - Metformin 500 mg 2x a day, with meals >> 500 mg 1x a day with breakfast She is using NovoLog as needed 6 units for very high blood sugars now.  Prev. On: - Tresiba 90 +/-20 units at bedtime >> stopped 3 mo ago - Novolog ~10 units 3x a day, before meals  - Jardiance >> yeast inf's.  Pt.checks her sugars more than 4 times a day with his CGM.  Freestyle libre CGM parameters (we will scan the reports): - Average: 131 - % active CGM time: 93% of the time - Glucose variability 31.6% (target < or = to 36%) - time in range:  - very low (<54): 1% - low (54-69): 6% - normal range (70-180): 82% - high sugars (181-250): 11% - very high sugars (>250): 0%   Previously: - am: 30-40 (when on Libre)-300 but on recheck: 100s - 2h after b'fast: n/c - before lunch: 50-150 - 2h after lunch: n/c - before dinner: 90-120 - 2h after dinner: n/c - bedtime: 150-180 - nighttime: n/c Lowest sugar was 30 >> 53 (Libre); she has hypoglycemia awareness at 100.  Highest sugar was 300 >> 243 (libre).  Glucometer: [employer], One Touch  Pt's meals are: (pbs with teeth, cannot eat hard foods) - Breakfast: trying oatmeal - Lunch: cold cut meats + cheese puffs, 3 [employer]ket[employer] bar - Dinner: Different - Snacks: chocolate, juice; also snacks at night Tries to walk for exercise daily.  -No CKD, last BUN/creatinine:  06/24/2019: 7/0.58, glucose 172 No results found for: BUN, CREATININE On losartan.  -+ HL; last set of lipids: 06/24/2019:  207/130/43/138 09/16/2018: 267/404/38/190 No results found for: CHOL, HDL, LDLCALC, LDLDIRECT, TRIG, CHOLHDL On pravastatin 20.  Also on vascepa.  - last eye exam was on 09/14/2018: No DR, + cataract  -She denies numbness and tingling in her feet.  Latest diabetic foot exam was on 07/01/2019: Abnormal: callused feet, onychomycosis, long nails; but with normal circulation and response to monofilament.  She continues on Cymbalta 60 mg daily.  Latest TSH was normal 06/24/2019: TSH 1.06  Pt has FH of DM in M, [place], M aunt, M cousins.  Before last visit, she described weight loss: 70 lbs in last 7 mo- husband attributed this to stress and decreased appetite  ROS: Constitutional: no weight gain/+ weight loss, no fatigue, no subjective hyperthermia, no subjective hypothermia Eyes: no blurry vision, no xerophthalmia ENT: no sore throat, no nodules palpated in neck, no dysphagia, no odynophagia, no hoarseness Cardiovascular: no CP/no SOB/no palpitations/no leg swelling Respiratory: no cough/no SOB/no wheezing Gastrointestinal: no N/no V/no D/no C/no acid reflux Musculoskeletal: no muscle aches/no joint aches Skin: no rashes, no hair loss Neurological: no tremors/no numbness/no tingling/no dizziness  I reviewed pt's medications, allergies, PMH, social hx, family hx, and changes were documented in the history of present illness. Otherwise, unchanged from my initial visit note.  Past Medical History:  Diagnosis Date   Back pain    Diabetes mellitus, type II (HCC)    Heart murmur    Sleep apnea  Past Surgical History:  Procedure Laterality Date   CESAREAN SECTION     Social History   Socioeconomic History   Marital status: Married    Spouse name: Not on file   Number of children: 2   Years of education: Not on file   Highest education level: Not on file  Occupational History    Early retirement  Social Needs   Financial resource strain: Not on file   Food insecurity     Worry: Not on file    Inability: Not on file   Transportation needs    Medical: Not on file    Non-medical: Not on file  Tobacco Use   Smoking status: Current Every Day Smoker   Smokeless tobacco: Never Used  Substance and Sexual Activity   Alcohol use:  Once a year   Drug use: No   Current Outpatient Medications on File Prior to Visit  Medication Sig Dispense Refill   ALPRAZolam (XANAX) 0.5 MG tablet TAKE 1 TABLET EVERY 12 HOURS AS NEEDED FOR ANXIETY     amLODipine (NORVASC) 5 MG tablet TAKE 1 TABLET EVERY DAY AT LUNCH     busPIRone (BUSPAR) 15 MG tablet Take 1.5 tabs po BID for one week, then increase to 2 tabs po BID 180 tablet 0   busPIRone (BUSPAR) 30 MG tablet Take 1 tablet (30 mg total) by mouth 2 (two) times daily. 60 tablet 1   Continuous Blood Gluc Receiver (FREESTYLE LIBRE 2 READER SYSTM) DEVI 1 each by Does not apply route once for 1 dose. 1 each 0   Continuous Blood Gluc Sensor (FREESTYLE LIBRE 2 SENSOR SYSTM) MISC 1 each by Does not apply route every 14 (fourteen) days. 6 each 3   DULoxetine (CYMBALTA) 60 MG capsule Take 1 capsule (60 mg total) by mouth daily. 90 capsule 0   glucagon (GLUCAGON EMERGENCY) 1 MG injection Inject 1 mg into the muscle once as needed for up to 1 dose. 1 each 12   hydrochlorothiazide (MICROZIDE) 12.5 MG capsule Take 12.5 mg by mouth daily as needed.     HYDROcodone-acetaminophen (NORCO) 7.5-325 MG tablet      losartan (COZAAR) 100 MG tablet Take 100 mg by mouth daily.     metFORMIN (GLUCOPHAGE) 500 MG tablet Take 1 tablet (500 mg total) by mouth daily with breakfast. 90 tablet 3   Omega-3 Fatty Acids (FISH OIL PO) Take by mouth.     Oxcarbazepine (TRILEPTAL) 300 MG tablet Take 1 tablet (300 mg total) by mouth at bedtime. 90 tablet 0   pravastatin (PRAVACHOL) 20 MG tablet Take 20 mg by mouth daily.     tiZANidine (ZANAFLEX) 4 MG tablet TAKE 1 TABLET 4 TIMES A DAY AS NEEDED FOR MUSCLE SPASMS     VITAMIN D PO Take by mouth.      No current facility-administered medications on file prior to visit.    Allergies  Allergen Reactions   Nsaids    Sulfa Antibiotics    Family History  Problem Relation Age of Onset   Anxiety disorder Mother    Anxiety disorder Maternal Grandmother    Anxiety disorder Daughter   She also has a history of HL and lung cancer in father; heart disease in mother.  PE: BP (!) 150/82 (BP Location: Right Arm, Patient Position: Sitting, Cuff Size: Large)    Pulse 82    Ht 6' (1.829 m)    Wt 218 lb 9.6 oz (99.2 kg)    SpO2 99%  BMI 29.65 kg/m  Wt Readings from Last 3 Encounters:  09/23/19 218 lb 9.6 oz (99.2 kg)  08/05/19 219 lb (99.3 kg)   Constitutional: overweight, in NAD Eyes: PERRLA, EOMI, no exophthalmos ENT: moist mucous membranes, no thyromegaly, no cervical lymphadenopathy Cardiovascular: RRR, No MRG Respiratory: CTA B Gastrointestinal: abdomen soft, NT, ND, BS+ Musculoskeletal: no deformities, strength intact in all 4 Skin: moist, warm, no rashes Neurological: no tremor with outstretched hands, DTR normal in all 4  ASSESSMENT: 1. DM2, insulin-dependent, uncontrolled, without long-term complications, but with hypo and hyperglycemia  2. HL  3. Overweight  PLAN:  1. Patient with longstanding, uncontrolled, diabetes, on oral antidiabetic regimen with metformin only, off insulin at last visit. At that time, we reduce the dose of her Metformin to only 500 mg once a day as sugars are moderately low.  I advised her to reattach the freestyle libre CGM at that time.   -Since last visit, I received a message from Robley Friesobert Mitchum, PhD, her counselor, letting me know about the patient's anxiety/depression and the fact that she may overcorrect low blood sugars or eat a lot of sweets to try to prevent blood sugar crashes. -At this visit, we reviewed together the downloaded reports of her freestyle libre 2 CGM.  The sugars appear well controlled with 3 exceptions.  There is a  trend for slightly higher sugars at night, in the 160-170 range, but during the day, sugars are at goal.  The values listed by the CGM are low, occasionally in the 50s and 60s, but we discussed that these are usually higher if checked with a regular meter.  I reassured her that it is okay to continue to experience this but she may need to check with her regular glucometer if she feels that she may have a low blood sugar. -Reviewing the tracings, there is a significant spike in blood sugars around 3 or 4 in the morning and she confirms that she is eating a snack at that time.  She then corrects the high blood sugar with 6 units of NovoLog and sugars increase precipitously afterwards.  I advised her that she does not need this, the sugars do not appear to be low in the middle of the night.  However, if she feels that she absolutely wants to have the snack and her sugars increase to higher than 220, she may use a lower dose of NovoLog. -For now, we will continue the Metformin low-dose only. - I suggested to:  Patient Instructions  Please continue: - Metformin 500 mg with breakfast  Stop snacking at night.   Try to only correct sugars >220, and then, only use 2-3 units of Novolog.  Please return in 4 months with your sugar log.   - we checked her HbA1c: 6.2% (lower) - advised to check sugars at different times of the day - 4x a day, rotating check times - advised for yearly eye exams >> she is UTD - return to clinic in 4 months  2. HL - Reviewed latest lipid panel form 05/2019: improved, but LDL still >goal - Continues the statin and vascepa without side effects.  3.  Overweight -She is out of the obesity category -She lost a significant amount of weight at the beginning of the year, but weight stable since last visit -Discussed about stopping snacks at night  Carlus Pavlovristina Carliss Quast, MD PhD [facility]

## 2019-09-23 NOTE — Patient Instructions (Addendum)
Please continue: - Metformin 500 mg with breakfast  Stop snacking at night.   Try to only correct sugars >220, and then, only use 2-3 units of Novolog.  Please return in 4 months with your sugar log.

## 2019-09-24 ENCOUNTER — Ambulatory Visit (INDEPENDENT_AMBULATORY_CARE_PROVIDER_SITE_OTHER): Payer: BC Managed Care – PPO | Admitting: Psychiatry

## 2019-09-24 ENCOUNTER — Other Ambulatory Visit: Payer: Self-pay

## 2019-09-24 DIAGNOSIS — Z630 Problems in relationship with spouse or partner: Secondary | ICD-10-CM | POA: Diagnosis not present

## 2019-09-24 DIAGNOSIS — E1100 Type 2 diabetes mellitus with hyperosmolarity without nonketotic hyperglycemic-hyperosmolar coma (NKHHC): Secondary | ICD-10-CM

## 2019-09-24 DIAGNOSIS — F5105 Insomnia due to other mental disorder: Secondary | ICD-10-CM | POA: Diagnosis not present

## 2019-09-24 DIAGNOSIS — G4700 Insomnia, unspecified: Secondary | ICD-10-CM

## 2019-09-24 DIAGNOSIS — Z636 Dependent relative needing care at home: Secondary | ICD-10-CM

## 2019-09-24 DIAGNOSIS — F322 Major depressive disorder, single episode, severe without psychotic features: Secondary | ICD-10-CM

## 2019-09-24 DIAGNOSIS — F99 Mental disorder, not otherwise specified: Secondary | ICD-10-CM

## 2019-09-24 DIAGNOSIS — F411 Generalized anxiety disorder: Secondary | ICD-10-CM | POA: Diagnosis not present

## 2019-09-24 NOTE — Progress Notes (Signed)
Psychotherapy Progress Note [facility] Marliss Czar, PhD LP  Patient ID: Meagan Moran     MRN: [id number]     Therapy format: Individual psychotherapy Date: 09/24/2019     Start: 8:18a Stop: 9:06a Time Spent: 48 min Location: in-person   Session narrative (presenting needs, interim history, self-report of stressors and symptoms, applications of prior therapy, status changes, and interventions made in session) Endocrinology went well yesterday, A1C of 6.2, celebrated.  Better than Meagan Moran's, in fact.  Advised by Dr to reduce insulin dose in the morning and try to go for smaller, more frequent meals to keep b.s. level.  Struggling still with Meagan Moran's carb habit, buying himself indulgences like a big cookie.  Hopeful he will accept the wisdom of eating more carb-controlled for his CFS and his cancer.  Meagan Moran going for followup tests, including PET and bone scans, extensive blood work today.  It is upsetting, though couple remain in same bedroom and have not let it spark conflict.  Meagan Moran wants to come support, give PT a break on support role.  It is daunting, naturally.  In touch with close friends who are fighting a losing battle with cancer (the man's).    Has renegotiated rest period for the afternoon and confirmed it's OK with Meagan Moran for her to rest and listen to TV, not necessarily sleep.  Pleased Meagan Moran has looked into chiropractor PT goes to, for more help for his fibromyalgia and ED.  Has also experienced pain and blood in his semen, which makes it clearer how distressing and personal this is.  Going for some walks together, dividing some household labor.  Overall, trying to stay 1-day-at-a-time.  Feels she has settled issues with her mother's memory.  Feels she has gotten to about 7/10 fluency in managing distractions and intrusive "have-tos".  Discussed possibility of naming her inner voice, the worrier inside of her (e.g., "Chicken Little"), or thinking of it as a pesky member on her  mental "committee" she must hear from but not yield the floor for too long.    Therapeutic modalities: Cognitive Behavioral Therapy and Solution-Oriented/Positive Psychology  Mental Status/Observations:  Appearance:   Casual     Behavior:  Appropriate  Motor:  Normal  Speech/Language:   Clear and Coherent  Affect:  Appropriate  Mood:  anxious and depressed  Thought process:  normal  Thought content:    WNL and worries  Sensory/Perceptual disturbances:    WNL  Orientation:  grossly intact  Attention:  Fair  Concentration:  Fair  Memory:  grossly intact  Insight:    Good  Judgment:   Good  Impulse Control:  Fair   Risk Assessment: Danger to Self: No Self-injurious Behavior: No Danger to Others: No Physical Aggression / Violence: No Duty to Warn: No Access to Firearms a concern: No  Assessment of progress:  progressing  Diagnosis:   ICD-10-CM   1. Current severe episode of major depressive disorder without psychotic features, unspecified whether recurrent (HCC)  F32.2   2. Generalized anxiety disorder  F41.1   3. Insomnia due to other mental disorder  F51.05    F99   4. Relationship problem between partners  Z63.0   5. Type 2 diabetes mellitus with hyperosmolarity without coma, unspecified whether long term insulin use (HCC)  E11.00   6. Insomnia, unspecified type  G47.00   7. Caregiver stress  Z63.6     Plan:  . Continue to combat urgent thinking, possibly name it "Chicken Little"  or something that speaks to her . Continue tending household needs and taking little times to share with husband without demand, e.g., walks . Continue to temper urgent feelings and refrain from pestering when it comes to Meagan Moran's condition -- take his lead what he needs and know he is more assured by her keeping it simpler . Other recommendations/advice as noted above . Continue to utilize previously learned skills ad lib . Maintain medication as prescribed and work faithfully with relevant  prescriber(s) if any changes are desired or seem indicated . Call the clinic on-call service, present to ER, or call 911 if any life-threatening psychiatric crisis Return for session(s) already scheduled.  [name], PhD [name], PhD LP Clinical Psychologist, [facility] [address], [place], [place] [address] [phone]

## 2019-09-29 ENCOUNTER — Other Ambulatory Visit: Payer: Self-pay

## 2019-09-29 ENCOUNTER — Ambulatory Visit (INDEPENDENT_AMBULATORY_CARE_PROVIDER_SITE_OTHER): Payer: BC Managed Care – PPO | Admitting: Psychiatry

## 2019-09-29 DIAGNOSIS — Z630 Problems in relationship with spouse or partner: Secondary | ICD-10-CM

## 2019-09-29 DIAGNOSIS — F322 Major depressive disorder, single episode, severe without psychotic features: Secondary | ICD-10-CM

## 2019-09-29 DIAGNOSIS — F411 Generalized anxiety disorder: Secondary | ICD-10-CM

## 2019-09-29 DIAGNOSIS — F99 Mental disorder, not otherwise specified: Secondary | ICD-10-CM

## 2019-09-29 DIAGNOSIS — F5105 Insomnia due to other mental disorder: Secondary | ICD-10-CM

## 2019-09-29 DIAGNOSIS — E1100 Type 2 diabetes mellitus with hyperosmolarity without nonketotic hyperglycemic-hyperosmolar coma (NKHHC): Secondary | ICD-10-CM | POA: Diagnosis not present

## 2019-09-29 DIAGNOSIS — M545 Low back pain, unspecified: Secondary | ICD-10-CM

## 2019-09-29 NOTE — Progress Notes (Signed)
Psychotherapy Progress Note [facility] Marliss Czar, PhD LP  Patient ID: Meagan Moran     MRN: [id number]     Therapy format: Individual psychotherapy Date: 09/29/2019     Start: 10:18a Stop: 11:09a Time Spent: 51 min Location: in-person   Session narrative (presenting needs, interim history, self-report of stressors and symptoms, applications of prior therapy, status changes, and interventions made in session) Continuing 2/wk for now, as H is concerned for her welfare and stability.  He now calls her emotional condition "It", and says she has been "lying" in therapy.  She does worry about his cancer, can't help but think some of  parents and a friend Bud and their battles with cancer, and she will worry over whether [name] gets in touch with his family about his diagnosis.  It can annoy him, sometimes powerfully, and allegedly he has been saying, "I need a wife," which sounds to only put more pressure on her.    Says [name] is doubtful she can handle a long day out for his bone scan next week, still thinking her stamina and ability to think are too adversely affected, but she got him to allow a practice run yesterday navigating, driving, and being up for a while accompanying [name] to the cancer center.  As H is in parallel therapy and was here yesterday, clarified that his challenge was not to proclaim things like "I need a wife, not 'It' " (as if saying he does not want her) but to go ahead and bet on her to be able to listen and be supportive.    Sees more window for the two of them to work together on finances, which is comforting, just ongoing challenge to keep her own worry from breaking out and turning into pestering, with his low tolerance.  Re. diabetes, is following endocrinologists' advice on moderating morning dose, and getting rid of old supplies so it won't be confusing.  Food problematic sometimes for missing some teeth, sometimes for her interest in better amounts  of veg and low carb vs H's taste for carbs.  Is clearer now that exercise is not the culprit in low b.s. episodes, but exaggerated judgment about insulin.    Maintaining meds as prescribed here, sess benefit both to antianxiety agents and antidepressant.  Maintaining a moderate exercise regimen.  D and S supportive.  D healing from hip surgery, back to desk duty now.  Holiday plans TBD.  Therapeutic modalities: Cognitive Behavioral Therapy and Solution-Oriented/Positive Psychology  Mental Status/Observations:  Appearance:   Casual     Behavior:  Appropriate  Motor:  Normal  Speech/Language:   slowed  Affect:  Appropriate  Mood:  anxious and depressed  Thought process:  normal  Thought content:    WNL  Sensory/Perceptual disturbances:    WNL  Orientation:  Fully oriented  Attention:  Good  Concentration:  Fair  Memory:  grossly intact  Insight:    Fair  Judgment:   Good  Impulse Control:  Fair   Risk Assessment: Danger to Self: No Self-injurious Behavior: No Danger to Others: No Physical Aggression / Violence: No Duty to Warn: No Access to Firearms a concern: No  Assessment of progress:  stabilized  Diagnosis:   ICD-10-CM   1. Generalized anxiety disorder  F41.1   2. Current severe episode of major depressive disorder without psychotic features, unspecified whether recurrent (HCC)  F32.2    improving  3. Relationship problem between partners  Z63.0   4.  Type 2 diabetes mellitus with hyperosmolarity without coma, unspecified whether long term insulin use (HCC)  E11.00   5. Low back pain, unspecified back pain laterality, unspecified chronicity, unspecified whether sciatica present  M54.5   6. Insomnia due to other mental disorder  F51.05    F99     Plan:  . Janne Napoleon away from calling her anxiety "It" -- too objectifying, too easy to take as condemnation of her.  Prefer to use a more playful, throwaway name so she can better disregard it. . Self-affirm that H needs to vent,  but his true need is to see her more self-possessed and prevent his own feeling at-risk for being smothered in anxiety . Continue with rest breaks, as interested, no requirement to nap . Try to downplay sarcasm from [name], just do what makes sense to herself, without overdoing . Other recommendations/advice as noted above . Continue to utilize previously learned skills ad lib . Maintain medication as prescribed and work faithfully with relevant prescriber(s) if any changes are desired or seem indicated . Call the clinic on-call service, present to ER, or call 911 if any life-threatening psychiatric crisis Return for session(s) already scheduled.  [name], PhD [name], PhD LP Clinical Psychologist, [facility] [address] [place], [place] [address] [phone]

## 2019-10-01 ENCOUNTER — Encounter: Payer: Self-pay | Admitting: Psychiatry

## 2019-10-01 ENCOUNTER — Ambulatory Visit (INDEPENDENT_AMBULATORY_CARE_PROVIDER_SITE_OTHER): Payer: BC Managed Care – PPO | Admitting: Psychiatry

## 2019-10-01 ENCOUNTER — Other Ambulatory Visit: Payer: Self-pay

## 2019-10-01 DIAGNOSIS — Z630 Problems in relationship with spouse or partner: Secondary | ICD-10-CM

## 2019-10-01 DIAGNOSIS — F322 Major depressive disorder, single episode, severe without psychotic features: Secondary | ICD-10-CM | POA: Diagnosis not present

## 2019-10-01 DIAGNOSIS — F411 Generalized anxiety disorder: Secondary | ICD-10-CM | POA: Diagnosis not present

## 2019-10-01 DIAGNOSIS — E1100 Type 2 diabetes mellitus with hyperosmolarity without nonketotic hyperglycemic-hyperosmolar coma (NKHHC): Secondary | ICD-10-CM

## 2019-10-01 DIAGNOSIS — F99 Mental disorder, not otherwise specified: Secondary | ICD-10-CM

## 2019-10-01 DIAGNOSIS — F17210 Nicotine dependence, cigarettes, uncomplicated: Secondary | ICD-10-CM

## 2019-10-01 DIAGNOSIS — Z636 Dependent relative needing care at home: Secondary | ICD-10-CM

## 2019-10-01 DIAGNOSIS — F5105 Insomnia due to other mental disorder: Secondary | ICD-10-CM
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
## 2019-10-05 ENCOUNTER — Other Ambulatory Visit: Payer: Self-pay

## 2019-10-05 ENCOUNTER — Ambulatory Visit (INDEPENDENT_AMBULATORY_CARE_PROVIDER_SITE_OTHER): Payer: BC Managed Care – PPO | Admitting: Psychiatry

## 2019-10-05 DIAGNOSIS — E1100 Type 2 diabetes mellitus with hyperosmolarity without nonketotic hyperglycemic-hyperosmolar coma (NKHHC): Secondary | ICD-10-CM

## 2019-10-05 DIAGNOSIS — Z630 Problems in relationship with spouse or partner: Secondary | ICD-10-CM | POA: Diagnosis not present

## 2019-10-05 DIAGNOSIS — Z636 Dependent relative needing care at home: Secondary | ICD-10-CM

## 2019-10-05 DIAGNOSIS — F5105 Insomnia due to other mental disorder: Secondary | ICD-10-CM

## 2019-10-05 DIAGNOSIS — F99 Mental disorder, not otherwise specified: Secondary | ICD-10-CM

## 2019-10-05 DIAGNOSIS — F411 Generalized anxiety disorder: Secondary | ICD-10-CM | POA: Diagnosis not present

## 2019-10-05 DIAGNOSIS — F322 Major depressive disorder, single episode, severe without psychotic features: Secondary | ICD-10-CM

## 2019-10-05 NOTE — Progress Notes (Signed)
Psychotherapy Progress Note [facility] [name], PhD LP  Patient ID: Meagan Moran     MRN: [id number]     Therapy format: Individual psychotherapy Date: 10/05/2019     Start: 8:12a Stop: 9:00a Time Spent: 48 min Location: in-person   Session narrative (presenting needs, interim history, self-report of stressors and symptoms, applications of prior therapy, status changes, and interventions made in session) [name]'s bone scan yesterday, day-long affair.  "I messed it up" -- [name] moved out of the bedroom, declaring that " 'It' rules now."  Apparently this happened in response to her going in and out at the hospital, possibly violating pandemic rules, and she says she got "chased out" of the hospital area.  One problem that she had some confusion, but mainly she pestered [name] with questions and need for direction once things started getting unsettled.  In the aftermath, she messed up cooking dinner, then basically kept trying to solicit him the rest of the evening to "make up for" her mistakes.  Objectively, she is worried about him and the extent of his cancer, but basically, the pattern activated their most compulsive responses to uncertainty -- she to seek reassurance and to do enough, he to shut things down and get distance from these feelings.    Anxious stomach this morning.  Framed H's emotional "allergy", oriented to distancer/pursuer dynamics, and "translated" [name]'s "I need a wife" to mean "I need a rest, please do what you can and let me rest."  Reassured that it's OK, unfortunate, but he'll come around, and her challenge is to trust him to handle his own feelings without trying to "fix" it.  This morning, she woke up to vitamins/supplements laid out for her last night, which she  recognizes as a kindness and good sign.    Herself this morning, let herself trust the alarm clock and restrained herself from trying to clean up everything.  Prayer helpful, framed option  again to ask God instead of [name] for urgencies that come up and to look for opportunities to let [name] be [name].  Resolved to breathe before popping a question, ask Jesus first.  Talk already today of moving things to outfit the spare bedroom better for some time away -- reassured that he needs to say it, he doesn't necessarily need to do it, and if he does, it will be temporary if she can show him she won't pester or melt down.  Therapeutic modalities: Cognitive Behavioral Therapy, Solution-Oriented/Positive Psychology, Interpersonal and faith-sensitive  Mental Status/Observations:  Appearance:   Casual     Behavior:  Appropriate  Motor:  Normal  Speech/Language:   Clear and Coherent  Affect:  Appropriate  Mood:  anxious and worried  Thought process:  normal  Thought content:    Rumination  Sensory/Perceptual disturbances:    WNL  Orientation:  Fully oriented  Attention:  Good  Concentration:  Fair  Memory:  grossly intact  Insight:    Fair  Judgment:   Fair  Impulse Control:  Fair   Risk Assessment: Danger to Self: No Self-injurious Behavior: No Danger to Others: No Physical Aggression / Violence: No Duty to Warn: No Access to Firearms a concern: No  Assessment of progress:  situational setback(s)  Diagnosis:   ICD-10-CM   1. Generalized anxiety disorder  F41.1   2. Current severe episode of major depressive disorder without psychotic features, unspecified whether recurrent ([place])  F32.2   3. Relationship problem between partners  Z63.0  4. Caregiver stress  Z63.6   5. Type 2 diabetes mellitus with hyperosmolarity without coma, unspecified whether long term insulin use (HCC)  E11.00   6. Insomnia due to other mental disorder  F51.05    F99     Plan:  . Self-affirm signs that [name] is blustering, not literally serious, in anger . Use prayer and imaginally asking God/Jesus before going to [place] with an urgent question . Get sufficient sleep/rest . Pace housework and errands  to prevent panic, burnout, or back injury . Other recommendations/advice as noted above . Continue to utilize previously learned skills ad lib . Maintain medication as prescribed and work faithfully with relevant prescriber(s) if any changes are desired or seem indicated . Call the clinic on-call service, present to ER, or call 911 if any life-threatening psychiatric crisis Return for session(s) already scheduled.  Robley Fries, PhD Marliss Czar, PhD LP Clinical Psychologist, [facility] [address], [address] [place], [place] [address] [phone]

## 2019-10-08 ENCOUNTER — Ambulatory Visit (INDEPENDENT_AMBULATORY_CARE_PROVIDER_SITE_OTHER): Payer: BC Managed Care – PPO | Admitting: Psychiatry

## 2019-10-08 ENCOUNTER — Other Ambulatory Visit: Payer: Self-pay

## 2019-10-08 DIAGNOSIS — E1100 Type 2 diabetes mellitus with hyperosmolarity without nonketotic hyperglycemic-hyperosmolar coma (NKHHC): Secondary | ICD-10-CM

## 2019-10-08 DIAGNOSIS — M545 Low back pain, unspecified: Secondary | ICD-10-CM

## 2019-10-08 DIAGNOSIS — F322 Major depressive disorder, single episode, severe without psychotic features: Secondary | ICD-10-CM

## 2019-10-08 DIAGNOSIS — Z630 Problems in relationship with spouse or partner: Secondary | ICD-10-CM | POA: Diagnosis not present

## 2019-10-08 DIAGNOSIS — F411 Generalized anxiety disorder: Secondary | ICD-10-CM

## 2019-10-08 NOTE — Progress Notes (Signed)
Psychotherapy Progress Note [facility] [name], PhD LP  Patient ID: Meagan Moran     MRN: [id number]     Therapy format: Individual psychotherapy Date: 10/08/2019     Start: 8:13a Stop: 9:00a Time Spent: 47 min Location: in-person   Session narrative (presenting needs, interim history, self-report of stressors and symptoms, applications of prior therapy, status changes, and interventions made in session) Uncle died.  H back in the bed already.  H hitting hard supplements from chiropractor and learning about cancer.  Reacted this morning to her doing dishes (his job, reportedly.  He may want a 3rd opinion on his prostate.  Feels she has been able to hold down annoyances and do some successful cooking last couple days.  Trying to work through backlog of foods in the freezer that she does not want or need.  Both coming off sodas, shifting to water.  Fall garden coming in.  Wisely thought to clarify her intentions about going with him to doctors, helped get her recognized as trying to be helpful, not worrisome.  Have had some time crying together, good development for intimacy.  Spending some time sharing his interest in the news.  Helping him with his ADLs s/p prostate biopsy.  Friend Bud still facing what sounds to be terminal lung CA and denial about it.  Hard to focus on one thing at a time, and hard to multitask, both.  Repeatedly has to stop herself from trying to race.  Still finds it embarrassing.    Coached in continuing to apply graceful attitude toward her limitations, and her no longer "having it together" the way she used to be used to.  "Permission to limp."  Advocated perspective that tells the difference between "have tos"and "care tos", and suggested she make a practice of identifying at least one "don't have to" on her "to do" list.    Therapeutic modalities: Cognitive Behavioral Therapy, Solution-Oriented/Positive Psychology and Ego-Supportive  Mental  Status/Observations:  Appearance:   Casual     Behavior:  Appropriate  Motor:  Normal and except for back pain  Speech/Language:   Clear and Coherent  Affect:  fatigued  Mood:  anxious, dysthymic and nonlabile  Thought process:  normal and minor flight of ideas  Thought content:    WNL  Sensory/Perceptual disturbances:    WNL  Orientation:  Fully oriented  Attention:  Good  Concentration:  Fair  Memory:  grossly intact  Insight:    Good  Judgment:   Good  Impulse Control:  Good   Risk Assessment: Danger to Self: No Self-injurious Behavior: No Danger to Others: No Physical Aggression / Violence: No Duty to Warn: No Access to Firearms a concern: No  Assessment of progress:  stabilized  Diagnosis:   ICD-10-CM   1. Generalized anxiety disorder  F41.1   2. Current severe episode of major depressive disorder without psychotic features, unspecified whether recurrent ([place])  F32.2   3. Relationship problem between partners  Z63.0   4. Low back pain, unspecified back pain laterality, unspecified chronicity, unspecified whether sciatica present  M54.5   5. Type 2 diabetes mellitus with hyperosmolarity without coma, unspecified whether long term insulin use (HCC)  E11.00     Plan:  . Keep applying humane standards to self-expectations per day; challenge to make to-do list and explicitly designate at least one "won't do" . Continue trying to let H decide his own actions . Stay open to tears when needed, shared when open  to it . Stay mindful of diabetes management principles and resist "chasing" b.s. . Walking ad lib . Other recommendations/advice as noted above . Continue to utilize previously learned skills ad lib . Maintain medication as prescribed and work faithfully with relevant prescriber(s) if any changes are desired or seem indicated . Call the clinic on-call service, present to ER, or call 911 if any life-threatening psychiatric crisis Return in about 1 week (around  10/15/2019).  Robley Fries, PhD Marliss Czar, PhD LP Clinical Psychologist, [facility] [address], [address] [place], [place] [address] [phone]

## 2019-10-12 ENCOUNTER — Other Ambulatory Visit: Payer: Self-pay

## 2019-10-12 ENCOUNTER — Ambulatory Visit (INDEPENDENT_AMBULATORY_CARE_PROVIDER_SITE_OTHER): Payer: BC Managed Care – PPO | Admitting: Psychiatry

## 2019-10-12 ENCOUNTER — Other Ambulatory Visit: Payer: Self-pay | Admitting: Psychiatry

## 2019-10-12 DIAGNOSIS — M545 Low back pain, unspecified: Secondary | ICD-10-CM

## 2019-10-12 DIAGNOSIS — F322 Major depressive disorder, single episode, severe without psychotic features: Secondary | ICD-10-CM

## 2019-10-12 DIAGNOSIS — F411 Generalized anxiety disorder: Secondary | ICD-10-CM

## 2019-10-12 DIAGNOSIS — E1100 Type 2 diabetes mellitus with hyperosmolarity without nonketotic hyperglycemic-hyperosmolar coma (NKHHC): Secondary | ICD-10-CM

## 2019-10-12 MED ORDER — ALPRAZOLAM 0.5 MG PO TABS: 0.5000 mg | ORAL_TABLET | Freq: Three times a day (TID) | ORAL | 1 refills | Status: DC | PRN

## 2019-10-12 NOTE — Telephone Encounter (Signed)
Pt. Made aware.
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
## 2019-10-12 NOTE — Telephone Encounter (Signed)
Meagan Moran was in the office today to see Dr. Rica Mote.  Because of issues in her life right now, she asked if you would begin to prescribe her Xanax 0.5mg .  She would like to increase to 3/day.  If this ok with you, send script to [facility].  Appt 11/08/19.

## 2019-10-15 ENCOUNTER — Ambulatory Visit (INDEPENDENT_AMBULATORY_CARE_PROVIDER_SITE_OTHER): Payer: BC Managed Care – PPO | Admitting: Psychiatry

## 2019-10-15 ENCOUNTER — Other Ambulatory Visit: Payer: Self-pay

## 2019-10-15 ENCOUNTER — Other Ambulatory Visit: Payer: Self-pay | Admitting: Psychiatry

## 2019-10-15 DIAGNOSIS — F99 Mental disorder, not otherwise specified: Secondary | ICD-10-CM

## 2019-10-15 DIAGNOSIS — E1100 Type 2 diabetes mellitus with hyperosmolarity without nonketotic hyperglycemic-hyperosmolar coma (NKHHC): Secondary | ICD-10-CM

## 2019-10-15 DIAGNOSIS — M545 Low back pain, unspecified: Secondary | ICD-10-CM

## 2019-10-15 DIAGNOSIS — Z636 Dependent relative needing care at home: Secondary | ICD-10-CM

## 2019-10-15 DIAGNOSIS — F322 Major depressive disorder, single episode, severe without psychotic features: Secondary | ICD-10-CM

## 2019-10-15 DIAGNOSIS — F411 Generalized anxiety disorder: Secondary | ICD-10-CM

## 2019-10-15 DIAGNOSIS — Z630 Problems in relationship with spouse or partner: Secondary | ICD-10-CM | POA: Diagnosis not present

## 2019-10-15 DIAGNOSIS — F5105 Insomnia due to other mental disorder: Secondary | ICD-10-CM

## 2019-10-15 NOTE — Progress Notes (Signed)
Psychotherapy Progress Note [facility] [name], PhD LP  Patient ID: Meagan Moran     MRN: [id number]     Therapy format: Individual psychotherapy Date: 10/15/2019     Start: 8:19a Stop: 9:09a Time Spent: 50 min Location: in-person   Session narrative (presenting needs, interim history, self-report of stressors and symptoms, applications of prior therapy, status changes, and interventions made in session) Tensions up and down with [name] lately, obviously driven by his cancer diagnosis and prospects (decided on robotic prostatectomy) plus PT's concerns about family gathering and pandemic risk.  Expecting daughter and son and both their families, including 7 kids, for Thanksgiving, though they may be in shifts, and son's family will bunk at a hotel.    Slipped yesterday and wrenched her back.  Using muscle relaxer now except when driving.  New alprazolam helped last night.    [name] still concerned for her to eat enough, but he is probably overemphasizing protein relative to what she can realistically process, now that he is aggressively pursuing carb control with a high-protein understanding.  Clarified understanding of metabolism and the link between carbs and cancer.  Resting strategy going OK, and amicable with H about how much to rest, how much activity is OK.  Still problems with "It" (her panicky moods), driving her to pester him.  Some nausea, but has not thrown up recently.  B.s. usually normal when that happens. Advised either breathing exercise or walk it off, depending on sense of restlessness.    Confesses some barking by H lately, but also able to just hold each other, and allowing for crying without judgment.  Differs with him about how much to "put on" the kids.  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  Knows she has a broker she can call if she needs advice.     Main need is to be more effective at treating her own panic instead of pestering, so recommended breathing, walking, or journaling as three things to try when feeling nervous stomach.  Take alprazolam if needed, option to wait, or half-dose, but OK to use it.  Therapeutic modalities: Cognitive Behavioral Therapy, Solution-Oriented/Positive Psychology and Ego-Supportive  Mental Status/Observations:  Appearance:   Casual     Behavior:  Appropriate  Motor:  Normal, affected by flareup in back pain  Speech/Language:   Clear and Coherent  Affect:  Appropriate  Mood:  anxious and depressed  Thought process:  normal  Thought content:    Obsessions  Sensory/Perceptual disturbances:    WNL  Orientation:  Fully oriented  Attention:  Good  Concentration:  Fair  Memory:  grossly intact  Insight:    Fair  Judgment:   Fair  Impulse Control:  Fair   Risk Assessment: Danger to Self: No Self-injurious Behavior: No Danger to Others: No Physical Aggression / Violence: No Duty to Warn: No Access to Firearms a concern: No  Assessment of progress:  stabilized  Diagnosis:   ICD-10-CM   1. Generalized anxiety disorder  F41.1   2. Current severe episode of major depressive disorder without psychotic features, unspecified whether recurrent ([place])  F32.2    improving, enduring stressors  3. Low back pain, unspecified back pain laterality, unspecified chronicity, unspecified whether sciatica present  M54.5   4. Relationship problem between partners  Z63.0   5. Type 2 diabetes mellitus with hyperosmolarity without coma, unspecified whether long  term insulin use (HCC)  E11.00   6. Caregiver stress  Z63.6   7. Insomnia due to other mental disorder  F51.05    F99     Plan:  . Breathe, walk, or journal as desired for rising anxious feelings.  Alprazolam OK to take within prescribed limits. . Address anxiety before pestering.  Still use prayer strategy if desired. . Continuing authority to modify or  negotiate with husband strategies for dealing with her anxiety -- affirm it's her choice, same as his treatment and his communication with family are his choices .  Marland Kitchen Other recommendations/advice as noted above . Continue to utilize previously learned skills ad lib . Maintain medication as prescribed and work faithfully with relevant prescriber(s) if any changes are desired or seem indicated . Call the clinic on-call service, present to ER, or call 911 if any life-threatening psychiatric crisis Return for session(s) already scheduled.  Robley Fries, PhD Marliss Czar, PhD LP Clinical Psychologist, [facility] [address] [place], [place] [address] [phone]

## 2019-10-17 NOTE — Progress Notes (Signed)
Psychotherapy Progress Note [facility] [name], PhD LP  Patient ID: Meagan Moran     MRN: [id number]     Therapy format: Individual psychotherapy Date: 07/06/2019     Start: c. 8am Stop: c. 8:45a  Time Spent: c. 45 min Location: in-person   Note reconstructed after data discovered missing from EHR for encounter known to have noted and closed. In view of lacking information, charge waived for this encounter.  (AM)  Discussed struggles with short-term memory, anxiety, feeling broken by stress.  Further educated in stress effects on attention and memory, need for sustained lowered stress for recovery.  Mental Status/Observations: unavailable due to data loss, recalled to be   Risk Assessment: unavailable due to data loss, no actionable risk present  Assessment of progress:  unavailable due to data loss, early treatment, was   Diagnosis:   ICD-10-CM   1. Current severe episode of major depressive disorder without psychotic features, unspecified whether recurrent ([place])  F32.2   2. Generalized anxiety disorder  F41.1     Plan:  . Current recommendations unavailable due to data loss . Other recommendations/advice as noted above . Continue to utilize previously learned skills ad lib . Maintain medication as prescribed and work faithfully with relevant prescriber(s) if any changes are desired or seem indicated . Call the clinic on-call service, present to ER, or call 911 if any life-threatening psychiatric crisis . Return for time as available.   [name], PhD [name], PhD LP Clinical Psychologist, [facility] [address], [place], [place] [address] [phone]

## 2019-10-19 ENCOUNTER — Other Ambulatory Visit: Payer: Self-pay

## 2019-10-19 ENCOUNTER — Ambulatory Visit: Payer: BC Managed Care – PPO | Admitting: Psychiatry

## 2019-10-19 DIAGNOSIS — F99 Mental disorder, not otherwise specified: Secondary | ICD-10-CM

## 2019-10-19 DIAGNOSIS — F411 Generalized anxiety disorder: Secondary | ICD-10-CM

## 2019-10-19 DIAGNOSIS — Z636 Dependent relative needing care at home: Secondary | ICD-10-CM

## 2019-10-19 DIAGNOSIS — F5105 Insomnia due to other mental disorder: Secondary | ICD-10-CM

## 2019-10-19 DIAGNOSIS — F322 Major depressive disorder, single episode, severe without psychotic features: Secondary | ICD-10-CM

## 2019-10-19 DIAGNOSIS — M545 Low back pain, unspecified: Secondary | ICD-10-CM

## 2019-10-19 DIAGNOSIS — Z630 Problems in relationship with spouse or partner: Secondary | ICD-10-CM | POA: Diagnosis not present

## 2019-10-19 DIAGNOSIS — E1100 Type 2 diabetes mellitus with hyperosmolarity without nonketotic hyperglycemic-hyperosmolar coma (NKHHC): Secondary | ICD-10-CM

## 2019-10-19 NOTE — Progress Notes (Signed)
Psychotherapy Progress Note [facility] [name], PhD LP  Patient ID: Meagan Moran     MRN: [id number]     Therapy format: Individual psychotherapy Date: 10/19/2019     Start: 8:14a Stop: 9:01a Time Spent: 47 min Location: In-person   Session narrative (presenting needs, interim history, self-report of stressors and symptoms, applications of prior therapy, status changes, and interventions made in session) "Interesting time."  Has had both of her kids and 6 grandchildren in, overlapping time.  [name] threw out food she found in the fridge that had old dates (but had been frozen).  A lot of alcohol and sweets in the house with them right now.  Worries for them, knowing that alcoholism that runs in her family, and she had a young habit herself.  The kids plan to take care of dinner tonight, very hard for her to accept.  Fun with grandkids, a positive, though she can't help but worry.  [name]'s prostate cancer care means prostatectomy next month, more info coming today.  Tends to worry over things like leaf pickup (which just happened).  Trying to practice sitting with [name] and not do things that "belong" to him.    Encouraged/affirmed in allowing more than managing family members' service and activity.  Within her faith, reframed gifts of help as acts of service, ways someone else is trying to practice Godly kindness, not to be punished by saying they shouldn't, but rewarded.  Developmentally, reframed as letting them be the adults she raised them to be, and from an ending the cycle standpoint, halting the effect of generations of perfectionism and self-abuse, maybe even in a way her parents would now want, having seen beyond life.  Also framed as gardening -- you don't always prune things or fertilize every day but let nature take its course.  For emotional freedom, endorsed attitude of OK to cry -- not a judgment or failure, but necessary to let come through, then do the next  useful thing.    Will let D help her schedule orthopedic visit for her hip, related to on-the-job injury, nearing 2 years now on a worker's comp claim.    Pt adhering to vitamin recommendations from chiropractor, incl vit D, some drops (can't recall what is in them).    Therapeutic modalities: Cognitive Behavioral Therapy, Solution-Oriented/Positive Psychology, Ego-Supportive and faith-sensitive  Mental Status/Observations:  Appearance:   Casual     Behavior:  Appropriate  Motor:  Normal, stiff with back pain  Speech/Language:   Clear and Coherent, slowed  Affect:  Depressed  Mood:  anxious  Thought process:  normal and slowed a bit  Thought content:    WNL  Sensory/Perceptual disturbances:    WNL  Orientation:  Fully oriented  Attention:  Fair  Concentration:  Fair  Memory:  grossly intact, some limitation in WM  Insight:    Fair  Judgment:   Good  Impulse Control:  Fair   Risk Assessment: Danger to Self: No Self-injurious Behavior: No Danger to Others: No Physical Aggression / Violence: No Duty to Warn: No Access to Firearms a concern: No  Assessment of progress:  stabilized  Diagnosis:   ICD-10-CM   1. Generalized anxiety disorder  F41.1   2. Current severe episode of major depressive disorder without psychotic features, unspecified whether recurrent ([place])  F32.2   3. Relationship problem between partners  Z63.0   4. Caregiver stress  Z63.6   5. Low back pain, unspecified back pain laterality, unspecified  chronicity, unspecified whether sciatica present  M54.5   6. Type 2 diabetes mellitus with hyperosmolarity without coma, unspecified whether long term insulin use (HCC)  E11.00   7. Insomnia due to other mental disorder  F51.05    F99     Plan:  . Reframe things done for her as others' practice of what God calls them to, not to be refused out of embarrassment or obligation . Keep using breathing, prayer, mediation, movement, writing PRN for releasing anxiety  instead of acting on it . Other recommendations/advice as noted above . Continue to utilize previously learned skills ad lib . Maintain medication as prescribed and work faithfully with relevant prescriber(s) if any changes are desired or seem indicated . Call the clinic on-call service, present to ER, or call 911 if any life-threatening psychiatric crisis No follow-ups on file. Current [facility] appointments: Future Appointments  Date Time Provider Department Center  10/26/2019  8:00 AM Robley Fries, PhD [facility] None  10/29/2019  8:00 AM Robley Fries, PhD [facility] None  11/05/2019  8:00 AM Robley Fries, PhD [facility] None  11/08/2019  9:00 AM Corie Chiquito, PMHNP [facility] None  11/09/2019  9:00 AM Robley Fries, PhD [facility] None  11/12/2019  8:00 AM Robley Fries, PhD [facility] None  11/16/2019  8:00 AM Robley Fries, PhD [facility] None  01/25/2020  8:00 AM Carlus Pavlov, MD [facility] None    Robley Fries, PhD Marliss Czar, PhD LP Clinical Psychologist, [facility] [address], [address] [place], [place] [address] [phone]

## 2019-10-24 ENCOUNTER — Other Ambulatory Visit: Payer: Self-pay | Admitting: Psychiatry

## 2019-10-24 DIAGNOSIS — F411 Generalized anxiety disorder: Secondary | ICD-10-CM

## 2019-10-26 ENCOUNTER — Other Ambulatory Visit: Payer: Self-pay

## 2019-10-26 ENCOUNTER — Ambulatory Visit: Payer: BC Managed Care – PPO | Admitting: Psychiatry

## 2019-10-26 DIAGNOSIS — Z636 Dependent relative needing care at home: Secondary | ICD-10-CM

## 2019-10-26 DIAGNOSIS — G4700 Insomnia, unspecified: Secondary | ICD-10-CM

## 2019-10-26 DIAGNOSIS — E1100 Type 2 diabetes mellitus with hyperosmolarity without nonketotic hyperglycemic-hyperosmolar coma (NKHHC): Secondary | ICD-10-CM

## 2019-10-26 DIAGNOSIS — F411 Generalized anxiety disorder: Secondary | ICD-10-CM

## 2019-10-26 DIAGNOSIS — F5105 Insomnia due to other mental disorder: Secondary | ICD-10-CM

## 2019-10-26 DIAGNOSIS — F322 Major depressive disorder, single episode, severe without psychotic features: Secondary | ICD-10-CM

## 2019-10-26 DIAGNOSIS — Z630 Problems in relationship with spouse or partner: Secondary | ICD-10-CM

## 2019-10-26 DIAGNOSIS — F99 Mental disorder, not otherwise specified: Secondary | ICD-10-CM

## 2019-10-26 NOTE — Progress Notes (Signed)
Psychotherapy Progress Note [facility] Marliss Czar, PhD LP  Patient ID: Meagan Moran     MRN: [id number]     Therapy format: Individual psychotherapy Date: 10/26/2019     Start: 8:16a Stop: 9:05a Time Spent: 49 min Location: In-person   Session narrative (presenting needs, interim history, self-report of stressors and symptoms, applications of prior therapy, status changes, and interventions made in session) Brings notes from husband which are nonspecific but suggest renewed frustration with her, e.g., "Fought with 'It' for 2 days".  Acknowledges more trouble of late handling the kids leaving after visiting for TG, and [name]'s ambivalence now about whether she should come to his surgery this month.  Also claims boundary infringements for doing "his" chores.  Discussed as probably overly rigid, but apparently [name] needs to have clear boundaries about cleaning and other chores in order to (a) feel normal and useful and (b) trust they are preventing PT from running herself ragged.  Still limited appreciation on his part that sarcasm and pointed ways of saying these things make it considerably harder for her to throw off the idea that she is failing at being a wife and caregiver and the compulsion to do more.  Agreed she can try further to let it ride, knowing it is his way and that he comes around once he feels he has done enough to control or warn.  Addressed surgery companionship decision -- framed the problem of "lose-lose" thinking (i.e., only choices are to go and worry [name], or stay and feel like a derelict wife).  Flipped the thinking, filling in how she would cope with feeling overwhelmed or confused should she go, and modeled assertiveness in addressing H's likely worries should she go.  Likewise, reviewed positive reasons and meanings to staying home and letting one of the kids go, how she could consider herself faithful bu abiding by [name]'s wishes.  For feeling out of  touch, reminded she would not be in the operating or recovery room either way, and prompted imagination for the surgical team doing exactly the right things at exactly the right times, nimble and resourceful in their work, and H waking up OK, all to make it "good either way".  Back to living conditions, enjoyed the grandchildren while here, appreciated the housework and having Xmas tree put up for them.    Diabetes still having some ups and downs.  Sometimes has shaky episodes (est q 3 days), self-treated with 3-4 oz cranberry juice.  Advised be sure this is necessary, to prevent overtreating and "chasing" sugars.  Looked ahead to going home -- knows she will be asked how it went.  Agreed to "jerk his chain" a little by saying [place] had fired her, in hopes of restoring some playfulness.  Forecast next temptation to do his identified chores for him and imagined self-control.  Therapeutic modalities: Cognitive Behavioral Therapy and Solution-Oriented/Positive Psychology  Mental Status/Observations:  Appearance:   Casual     Behavior:  Appropriate  Motor:  Normal and affected by pain  Speech/Language:   Clear and Coherent  Affect:  Appropriate  Mood:  anxious and depressed  Thought process:  normal and worrisome  Thought content:    worries  Sensory/Perceptual disturbances:    WNL  Orientation:  Fully oriented  Attention:  Fair  Concentration:  Fair  Memory:  grossly intact  Insight:    Fair  Judgment:   Good  Impulse Control:  Fair   Risk Assessment: Danger to Self:  No Self-injurious Behavior: No Danger to Others: No Physical Aggression / Violence: No Duty to Warn: No Access to Firearms a concern: No  Assessment of progress:  stabilized  Diagnosis:   ICD-10-CM   1. Generalized anxiety disorder  F41.1   2. Current severe episode of major depressive disorder without psychotic features, unspecified whether recurrent ([place])  F32.2   3. Relationship problem between partners  Z63.0   4.  Caregiver stress  Z63.6   5. Insomnia due to other mental disorder  F51.05    F99   6. Insomnia, unspecified type  G47.00   7. Type 2 diabetes mellitus with hyperosmolarity without coma, unspecified whether long term insulin use ([place])  E11.00     Plan:  . Watch self for overtreating blood sugar . Try provocative humor with H as desired . Prefer "[name]" to "It" for naming her inner voice -- [name]'s "It" is the set of unwanted behavior in need of getting rid of; PT's "[name]" is the misguided worry in need of support . Other recommendations/advice as noted above . Continue to utilize previously learned skills ad lib . Maintain medication as prescribed and work faithfully with relevant prescriber(s) if any changes are desired or seem indicated . Call the clinic on-call service, present to ER, or call 911 if any life-threatening psychiatric crisis Return for session(s) already scheduled.  [name], PhD [name], PhD LP Clinical Psychologist, [facility] [address], [place], [place] [address] [phone]

## 2019-10-29 ENCOUNTER — Ambulatory Visit (INDEPENDENT_AMBULATORY_CARE_PROVIDER_SITE_OTHER): Payer: BC Managed Care – PPO | Admitting: Psychiatry

## 2019-10-29 ENCOUNTER — Other Ambulatory Visit: Payer: Self-pay

## 2019-10-29 DIAGNOSIS — Z636 Dependent relative needing care at home: Secondary | ICD-10-CM

## 2019-10-29 DIAGNOSIS — Z630 Problems in relationship with spouse or partner: Secondary | ICD-10-CM | POA: Diagnosis not present

## 2019-10-29 DIAGNOSIS — F411 Generalized anxiety disorder: Secondary | ICD-10-CM

## 2019-10-29 DIAGNOSIS — F322 Major depressive disorder, single episode, severe without psychotic features: Secondary | ICD-10-CM

## 2019-10-29 DIAGNOSIS — F5105 Insomnia due to other mental disorder: Secondary | ICD-10-CM

## 2019-10-29 DIAGNOSIS — M545 Low back pain, unspecified: Secondary | ICD-10-CM

## 2019-10-29 DIAGNOSIS — F99 Mental disorder, not otherwise specified: Secondary | ICD-10-CM

## 2019-10-29 DIAGNOSIS — E1100 Type 2 diabetes mellitus with hyperosmolarity without nonketotic hyperglycemic-hyperosmolar coma (NKHHC): Secondary | ICD-10-CM

## 2019-10-29 NOTE — Progress Notes (Signed)
Psychotherapy Progress Note [facility] Marliss Czar, PhD LP  Patient ID: Meagan Moran     MRN: [id number]     Therapy format: Individual psychotherapy Date: 10/29/2019     Start: 8:16a Stop: 9:05a Time Spent: 49 min Location: In-person   Session narrative (presenting needs, interim history, self-report of stressors and symptoms, applications of prior therapy, status changes, and interventions made in session) Meagan Moran sent audio recording with Meagan Moran about escalating nerves after the kids left, 4 days of arguments, a 40-minute argument he facetiously calls "bloodsport", and c/o arguments with "It", Meagan Moran not bathing while kids were here, his own escalating worry about cancer, and then an alleged epiphany on Meagan Moran's part that she was essentially channeling her mother.  Reviewed, contextualized, validating that some things are undoubtedly exaggerated for effect but unpacking the alleged argument.  Best can tell, "channeling" her mother actually refers to her being intractably worrisome for -- it is suggested -- listening to her mother's inner voice about not doing enough and needing to be more solicitous and competent to earn her keep as a wife.  Alprazolam allegedly helpful at preventing panic and calming impulses.  Additional c/o Meagan Moran wanting sex daily, which she is not up to with her nerves or her back pain.  Understandable, facing his surgery and expected ED to follow, but really not so feasible.  Advised she still has the right to decide whether to "report" to Meagan Moran, but unless she is well-decided about it, it re-enacts the dominating relationship her mother had with her.  Affirmed she still has the choice whether to have sex with him, as well, or to comfort him in less stressful, less provocative ways.  Discussed acceptable practices and Meagan Moran's tolerances for pain and capacity to control her frame of mind that sexual favors would be gifts without any sense of coercion, even unintentional coercion.   Framed the same principle in all interactions with husband that whatever they come to would be by choice, that both of them get to have a say-so.    Meagan Moran drawn to cover other aspects of the situation, run down unrelated details of living and the stresses.  Allowed to run through and return to her internal dialogue, mother's voice repeating in her head telling her she needs to lose weight, she's not doing something right, etc.  Framed a distinction, whether it is her mother's imagined voice or her husband's actual one, between directing and offering choices.  Clearly feels better, more capable, more adult, when there is no actual "battle" between Meagan Moran and mother or Meagan Moran and Meagan Moran but the battle is which tone to take.  Framed how, at this stage of life, Meagan Moran and Meagan Moran are each "mother" to her now, and the challenge is to manage the tone they each take with "Meagan" Meagan Moran.  At request, recorded a 6-minute briefing for Meagan Moran on phone to help send back guidance for his understanding.  Therapeutic modalities: Cognitive Behavioral Therapy and Solution-Oriented/Positive Psychology  Mental Status/Observations:  Appearance:   Casual     Behavior:  Appropriate  Motor:  affected by pain  Speech/Language:   Clear and Coherent  Affect:  Appropriate  Mood:  anxious and depressed  Thought process:  pressured  Thought content:    worries  Sensory/Perceptual disturbances:    WNL  Orientation:  Fully oriented  Attention:  Good  Concentration:  Fair  Memory:  grossly intact  Insight:    Fair  Judgment:   Good  Impulse  Control:  Fair   Risk Assessment: Danger to Self: No Self-injurious Behavior: No Danger to Others: No Physical Aggression / Violence: No Duty to Warn: No Access to Firearms a concern: No  Assessment of progress:  stabilized  Diagnosis:   ICD-10-CM   1. Generalized anxiety disorder  F41.1   2. Current severe episode of major depressive disorder without psychotic features, unspecified whether recurrent  ([place])  F32.2   3. Relationship problem between partners  Z63.0   4. Caregiver stress  Z63.6   5. Insomnia due to other mental disorder  F51.05    F99   6. Type 2 diabetes mellitus with hyperosmolarity without coma, unspecified whether long term insulin use (HCC)  E11.00   7. Low back pain, unspecified back pain laterality, unspecified chronicity, unspecified whether sciatica present  M54.5     Plan:  . Emphasize taking humane tone with herself and translating Meagan Moran's messages same when it comes to self-doubt, feelings of obligation or criticism, etc. . Other recommendations/advice as noted above . Continue to utilize previously learned skills ad lib . Maintain medication as prescribed and work faithfully with relevant prescriber(s) if any changes are desired or seem indicated . Call the clinic on-call service, present to ER, or call 911 if any life-threatening psychiatric crisis Return for session(s) already scheduled.  [name], PhD [name], PhD LP Clinical Psychologist, [facility] [address] [place], [place] [address] [phone]

## 2019-11-02 ENCOUNTER — Other Ambulatory Visit: Payer: Self-pay

## 2019-11-02 ENCOUNTER — Encounter: Payer: BC Managed Care – PPO | Attending: Internal Medicine | Admitting: Nutrition

## 2019-11-02 DIAGNOSIS — Z794 Long term (current) use of insulin: Secondary | ICD-10-CM | POA: Insufficient documentation

## 2019-11-02 DIAGNOSIS — E1165 Type 2 diabetes mellitus with hyperglycemia: Secondary | ICD-10-CM | POA: Diagnosis present

## 2019-11-02 NOTE — Progress Notes (Signed)
Patient is here today she says to review her Elenor Legato and she has some questions about her diet.   She reports not taking her Metformin, but would not give a reason for this.  I explained how Metformin works and that she should continue this, unless Dr. Cruzita Lederer tells her to stop.  She agreed to do this.  Insulin dose:  She only takes her Novolog when her blood sugars are high, and takes 10u.  She forgot that she was only taking 6u, and says this is the reason her sugars keep dropping.  CGM reveals that she is having lows, but patient claims that she has only taken Novolog 10u one, or two times in the last week.  Does not remember what days, or what times.  She was shown how to keep a record of this in her Elenor Legato and she will try to remember to do this.  She wants help with downloading her meter in her computer, but this requires her to go on line with her computer to set up an account.  I told her to use her apple phone and set up a [employer] account and once she does this, she can call me and I will help her to link to our account.  She agreed to do this in the morning.   Her diet is not well balanced, and she admits to eating "things she should not eat".  Bfast: is oatmeal-sweetened, with water to drink, snack at 10 AM of 1-2 pieces of lunch meat.   Lunch is 1-2 pieces of lunch meat with 2 small milkyway snack candies.   Supper is meat, with "maybe one veg., sometimes starchy, and sometimes not.  Water to drink. Explained the idea of a balanced meal with protein, carbs and a little fat.  Explained what those foods are, and the need to have all of them at each meal.  She agreed to do this.  She was given a handout of this, and she had no questions.   Download shows lows in the afternoon, but she denies symptoms   She was told to test her blood sugar when her reading is low, and see if it is accurate.  She agreed to do this and call me tomorrow.  Download showed to Dr. Cruzita Lederer .  Pt. Called back and told  to: 1. Take 1 metformin before supper 2.  Stop all Novolog for now.

## 2019-11-02 NOTE — Patient Instructions (Addendum)
Take 1 Metformin before supper Stop taking the Novolog Stop middle of the night snacking Test blood sugar when Meagan Moran is reading below 70, and call us if the blood sugar reading is below 70

## 2019-11-05 ENCOUNTER — Other Ambulatory Visit: Payer: Self-pay

## 2019-11-05 ENCOUNTER — Ambulatory Visit: Payer: BC Managed Care – PPO | Admitting: Psychiatry

## 2019-11-05 DIAGNOSIS — M545 Low back pain, unspecified: Secondary | ICD-10-CM

## 2019-11-05 DIAGNOSIS — F322 Major depressive disorder, single episode, severe without psychotic features: Secondary | ICD-10-CM

## 2019-11-05 DIAGNOSIS — F411 Generalized anxiety disorder: Secondary | ICD-10-CM

## 2019-11-05 DIAGNOSIS — F99 Mental disorder, not otherwise specified: Secondary | ICD-10-CM

## 2019-11-05 DIAGNOSIS — F5105 Insomnia due to other mental disorder: Secondary | ICD-10-CM

## 2019-11-05 DIAGNOSIS — E1100 Type 2 diabetes mellitus with hyperosmolarity without nonketotic hyperglycemic-hyperosmolar coma (NKHHC): Secondary | ICD-10-CM

## 2019-11-05 DIAGNOSIS — Z630 Problems in relationship with spouse or partner: Secondary | ICD-10-CM

## 2019-11-05 NOTE — Progress Notes (Addendum)
Psychotherapy Progress Note [facility] Meagan Moore, PhD LP  Patient ID: Meagan Moran     MRN: [id number]     Therapy format: Individual psychotherapy Date: 11/05/2019      Start: 8:14a      Stop: 9:00a     Time Spent: 46 min Location: In-person   Session narrative (presenting needs, interim history, self-report of stressors and symptoms, applications of prior therapy, status changes, and interventions made in session) Brings more notes from husband for [facility], saying she "can't" let him be, meds are working but may need different dose, he brought home flowers then lost his temper and trashed them.  Knows he is worried about his prostatectomy, coming up soon.  He is still undecided whether he wants her at the hospital (for fear of her getting unwound).  Discussed together -- seems that being able to be there would be reassuring to her, as fulfilling her duty), reminded PT of prior discussion that both going and letting daughter go in her place are "right" answers, that she can love him either way, and it is not a failure on her part if she decides to honor his wishes should he ask her not to be there.  Just that conflicted, and it is love to abide by the [name] 's choice.    Has seen endocrinologist, learned how to download [place].    Working through wash at home, in preparation for [place] and her wife coming.  Apparently a lot of linens that had gotten set aside or put things on in the course of clutter and TG visit.    Does cry a lot, worrying over Meagan Moran's health, and to some extent the money he has spent on their chiropractor's dubious recommendations.  Financially, they do go over things together, and she is learning more about investments and management under [employer].  Son is working on Set designer on the computer.    Repeats that she comes to crying, suggestion she is uncomfortable, ashamed about it.  Will try to deep-breathe.  Renewed  recommendation to allow tears to come through and accept, lest she practice the kind of self-suppression her mother taught her and which drove her into stress, depression, and breakdown over a long time to begin with.  Same with feeling angry, and the same with feeling worried -- best to acknowledge first, then decide what to do with these feelings.  Noted "trojan" thoughts like "Just let it go" (implication being to suppress/repress) and clarified no, allow to pass through.  Continued to encourage that she still has the right to be a work in progress, working through stress-related damage and undoing an old tendency to shame her own emotions.  Analyzed her moments of pestering -- mainly feels the need to help Meagan Moran in some way.  Challenged conception of boundaries, to consider that just being present is doing something, and to push beyond his comfort zone is actually to make it self-interested, manufacturing herself an opportunity to live up to unnecessary expectations.  PT took liberal notes, as usual, hopefully clear.  Admits the extra lorazepam and Cymbalta have been helpful for quieting anxious times and having enough energy and focus to do some things.  Disc hx as a young mother calling 37 for a panic attack.  EMTs initially applied O2, then saw she needed a paper bag instead.  Never happened again.  For irritated/frustrated times, coached briefly in breathing technique that is both expressive and soothing.  Meagan Moran's surgery set for 12/22, by her recollection, expected overnight in hospital.  Expecting a one-shot procedure to remove prostate and install a petcock/drain.    Therapeutic modalities: Cognitive Behavioral Therapy and Solution-Oriented/Positive Psychology  Mental Status/Observations:  Appearance:   Casual     Behavior:  preoccupied, responsive  Motor:  slowed  Speech/Language:   Clear and Coherent  Affect:  Depressed and responsive to humor, encouragement, validation  Mood:  anxious  and depressed  Thought process:  worrisome  Thought content:    worry  Sensory/Perceptual disturbances:    WNL  Orientation:  Fully oriented  Attention:  Good  Concentration:  Fair  Memory:  not formally assessed, working memory limited  Insight:    Fair  Judgment:   Good  Impulse Control:  Good   Risk Assessment: Danger to Self: No Self-injurious Behavior: No Danger to Others: No Physical Aggression / Violence: No Duty to Warn: No Access to Firearms a concern: No  Assessment of progress:  stabilized  Diagnosis:   ICD-10-CM   1. Generalized anxiety disorder  F41.1   2. Current severe episode of major depressive disorder without psychotic features, unspecified whether recurrent (HCC)  F32.2   3. Relationship problem between partners  Z63.0   4. Low back pain, unspecified back pain laterality, unspecified chronicity, unspecified whether sciatica present  M54.5   5. Type 2 diabetes mellitus with hyperosmolarity without coma, unspecified whether long term insulin use (HCC)  E11.00   6. Insomnia due to other mental disorder  F51.05    F99     Plan:  . Self-affirm today's reframes -- permission to be recovering, permission to feel what she feels then decide what to do, more than one "right" answer, more than one way to love and do duty by husband . Keep watch on any tendency to overdo self-care for diabetes (both insulin and juice/sugar) . Other recommendations/advice as noted above . Continue to utilize previously learned skills ad lib . Maintain medication as prescribed and work faithfully with relevant prescriber(s) if any changes are desired or seem indicated . Call the clinic on-call service, present to ER, or call 911 if any life-threatening psychiatric crisis Return in about 1 week (around 11/12/2019) for session(s) already scheduled. Current [facility] appointments: Future Appointments  Date Time Provider Department Center  11/08/2019  9:00 AM Meagan Moran, PMHNP CP-CP  None  11/09/2019  9:00 AM Meagan Fries, PhD CP-CP None  11/12/2019  8:00 AM Meagan Fries, PhD CP-CP None  11/16/2019  8:00 AM Meagan Fries, PhD CP-CP None  12/01/2019 10:00 AM Meagan Fries, PhD CP-CP None  12/07/2019  9:00 AM Meagan Fries, PhD CP-CP None  12/10/2019  2:00 PM Meagan Fries, PhD CP-CP None  12/14/2019  8:00 AM Meagan Fries, PhD CP-CP None  12/17/2019 10:00 AM Meagan Fries, PhD CP-CP None  12/21/2019  9:00 AM Meagan Fries, PhD CP-CP None  12/24/2019 10:00 AM Meagan Fries, PhD CP-CP None  01/25/2020  8:00 AM Carlus Pavlov, MD [facility] None    Meagan Fries, PhD Marliss Czar, PhD LP Clinical Psychologist, [facility] [address], [address] [place], [place] [address] [phone]

## 2019-11-08 ENCOUNTER — Encounter: Payer: Self-pay | Admitting: Psychiatry

## 2019-11-08 ENCOUNTER — Other Ambulatory Visit: Payer: Self-pay

## 2019-11-08 ENCOUNTER — Ambulatory Visit: Payer: BC Managed Care – PPO | Admitting: Psychiatry

## 2019-11-08 DIAGNOSIS — F99 Mental disorder, not otherwise specified: Secondary | ICD-10-CM

## 2019-11-08 DIAGNOSIS — F411 Generalized anxiety disorder: Secondary | ICD-10-CM | POA: Diagnosis not present

## 2019-11-08 DIAGNOSIS — F5105 Insomnia due to other mental disorder: Secondary | ICD-10-CM

## 2019-11-08 DIAGNOSIS — F39 Unspecified mood [affective] disorder: Secondary | ICD-10-CM

## 2019-11-08 MED ORDER — OXCARBAZEPINE 300 MG PO TABS: 300.0000 mg | ORAL_TABLET | Freq: Every day | ORAL | 0 refills | Status: DC

## 2019-11-08 MED ORDER — DULOXETINE HCL 30 MG PO CPEP: 30.0000 mg | ORAL_CAPSULE | Freq: Every day | ORAL | 0 refills | Status: DC

## 2019-11-08 MED ORDER — DULOXETINE HCL 60 MG PO CPEP: 60.0000 mg | ORAL_CAPSULE | Freq: Every day | ORAL | 0 refills | Status: DC

## 2019-11-08 NOTE — Patient Instructions (Signed)
Increase Duloxetine (Cymbalta) to 90 mg daily to improve mood and anxiety. Will take one 30 mg capsule and one 60 mg capsule to equal 90 mg daily.   Continue all other medications without changes.

## 2019-11-08 NOTE — Progress Notes (Signed)
Meagan Moran [id number] [birth year] 63 y.o.  Subjective:   Patient ID:  Meagan Moran is a 63 y.o. (DOB [birth year]) female.  Chief Complaint:  Chief Complaint  Patient presents with  . Anxiety  . Depression    HPI Meagan Moran Meagan Moran presents to the office today for follow-up of anxiety, depression, and insomnia. Meagan Moran reports that increase in Buspar seemed to be helping with her anxiety and then husband was dx'd with Prostate CA and is scheduled to have a prostatectomy at the end of the month. Pt reports, "I worry about everything" and has nausea due to anxiety. Also experiences muscle tension with stress and anxiety. Reports some catastrophic thoughts re: husband's surgery. Meagan Moran reports that Meagan Moran has not had any recent panic attacks. Continues to have some intrusive memories from childhood about her mother making negative comments about her. Pt reports that Meagan Moran has been crying some. Meagan Moran reports feeling "sad and frustrated." Meagan Moran reports that her energy and motivation have been ok. Meagan Moran reports that Meagan Moran will do some tasks and then "reach a stop point and throw up my hands." Meagan Moran reports that her sleep has been disrupted with repeated awakenings.Meagan Moran reports rare nightmares and occasional "strange dreams." Meagan Moran reports that Meagan Moran feels "hungry all the time." Denies SI.   Meagan Moran reports periods of "confusion... I walk around in a circle and try to figure out what to do next because there is so much to get done." Reports feeling overwhelmed. Reports that it is rare for her to feel disoriented and it typically coincides with fluctuations in her glucose levels. Meagan Moran reports that her concentration has been improving slightly.   Meagan Moran reports that her husband is asking about any medications "to knock me out better." Meagan Moran reports that husband is concerned about her ability to provide post-operative care at home and about it causing her increased anxiety.   Meagan Moran had a fall 1-2 weeks ago when Meagan Moran slipped going out her door.  Reports that Meagan Moran has been working with a Systems analyst and went this morning.    Past Psychiatric Medication Trials: Cymbalta- Effective for anxiety at 60 mg po qd.  Prozac- side effects Wellbutrin- side effects Trileptal- Effective. Caused nightmares. Was taking 300 mg po BID. Xanax   Review of Systems:  Review of Systems  Gastrointestinal: Positive for nausea and vomiting.  Musculoskeletal: Positive for back pain. Negative for gait problem.  Neurological:       Reports occ dizziness, typically when glucose levels are low.   Psychiatric/Behavioral:       Please refer to HPI    Medications: I have reviewed the patient's current medications.  Current Outpatient Medications  Medication Sig Dispense Refill  . ALPRAZolam (XANAX) 0.5 MG tablet Take 1 tablet (0.5 mg total) by mouth 3 (three) times daily as needed for anxiety. 90 tablet 1  . amLODipine (NORVASC) 5 MG tablet TAKE 1 TABLET EVERY DAY AT LUNCH    . busPIRone (BUSPAR) 30 MG tablet TAKE 1 TABLET BY MOUTH TWICE A DAY 180 tablet 0  . DULoxetine (CYMBALTA) 60 MG capsule Take 1 capsule (60 mg total) by mouth daily. Take 1 capsule with 60 mg to equal total dose of 90 mg 90 capsule 0  . hydrochlorothiazide (MICROZIDE) 12.5 MG capsule Take 12.5 mg by mouth daily as needed.    Marland Kitchen HYDROcodone-acetaminophen (NORCO) 7.5-325 MG tablet     . losartan (COZAAR) 100 MG tablet Take 100 mg by mouth daily.    . metFORMIN (GLUCOPHAGE) 500  MG tablet Take 1 tablet (500 mg total) by mouth daily with breakfast. (Patient taking differently: Take 500 mg by mouth 2 (two) times daily with a meal. ) 90 tablet 3  . Omega-3 Fatty Acids (FISH OIL PO) Take by mouth.    . Oxcarbazepine (TRILEPTAL) 300 MG tablet Take 1 tablet (300 mg total) by mouth at bedtime. 90 tablet 0  . pravastatin (PRAVACHOL) 20 MG tablet Take 20 mg by mouth daily.    Marland Kitchen tiZANidine (ZANAFLEX) 4 MG tablet TAKE 1 TABLET 4 TIMES A DAY AS NEEDED FOR MUSCLE SPASMS    . VITAMIN D PO Take  by mouth.    . Continuous Blood Gluc Receiver (FREESTYLE LIBRE 2 READER SYSTM) DEVI 1 each by Does not apply route once for 1 dose. 1 each 0  . Continuous Blood Gluc Sensor (FREESTYLE LIBRE 2 SENSOR SYSTM) MISC 1 each by Does not apply route every 14 (fourteen) days. 6 each 3  . DULoxetine (CYMBALTA) 30 MG capsule Take 1 capsule (30 mg total) by mouth daily. Take with 60 mg capsule to equal total dose of 90 mg. 90 capsule 0  . glucagon (GLUCAGON EMERGENCY) 1 MG injection Inject 1 mg into the muscle once as needed for up to 1 dose. 1 each 12   No current facility-administered medications for this visit.    Medication Side Effects: None  Allergies:  Allergies  Allergen Reactions  . Nsaids   . Sulfa Antibiotics     Past Medical History:  Diagnosis Date  . Back pain   . Diabetes mellitus, type II ([place])   . Heart murmur   . Sleep apnea     Family History  Problem Relation Age of Onset  . Anxiety disorder Mother   . Anxiety disorder Maternal Grandmother   . Anxiety disorder Daughter     Social History   Socioeconomic History  . Marital status: Married    Spouse name: Not on file  . Number of children: Not on file  . Years of education: Not on file  . Highest education level: Not on file  Occupational History  . Not on file  Tobacco Use  . Smoking status: Current Some Day Smoker    Packs/day: 0.05    Types: Cigarettes  . Smokeless tobacco: Never Used  . Tobacco comment: Peaked at 1 ppd in 2019, on/off for years  Substance and Sexual Activity  . Alcohol use: Not Currently    Comment: "only on the holidays"  . Drug use: Not Currently  . Sexual activity: Not on file  Other Topics Concern  . Not on file  Social History Narrative  . Not on file   Social Determinants of Health   Financial Resource Strain:   . Difficulty of Paying Living Expenses: Not on file  Food Insecurity:   . Worried About Charity fundraiser in the Last Year: Not on file  . Ran Out of Food in  the Last Year: Not on file  Transportation Needs:   . Lack of Transportation (Medical): Not on file  . Lack of Transportation (Non-Medical): Not on file  Physical Activity:   . Days of Exercise per Week: Not on file  . Minutes of Exercise per Session: Not on file  Stress:   . Feeling of Stress : Not on file  Social Connections:   . Frequency of Communication with Friends and Family: Not on file  . Frequency of Social Gatherings with Friends and Family: Not on file  .  Attends Religious Services: Not on file  . Active Member of Clubs or Organizations: Not on file  . Attends Banker Meetings: Not on file  . Marital Status: Not on file  Intimate Partner Violence:   . Fear of Current or Ex-Partner: Not on file  . Emotionally Abused: Not on file  . Physically Abused: Not on file  . Sexually Abused: Not on file    Past Medical History, Surgical history, Social history, and Family history were reviewed and updated as appropriate.   Please see review of systems for further details on the patient's review from today.   Objective:   Physical Exam:  There were no vitals taken for this visit.  Physical Exam Constitutional:      General: Meagan Moran is not in acute distress.    Appearance: Meagan Moran is well-developed.  Musculoskeletal:        General: No deformity.  Neurological:     Mental Status: Meagan Moran is alert and oriented to person, place, and time.     Coordination: Coordination normal.  Psychiatric:        Attention and Perception: Attention and perception normal. Meagan Moran does not perceive auditory or visual hallucinations.        Mood and Affect: Mood is anxious. Mood is not depressed. Affect is not labile, blunt, angry or inappropriate.        Speech: Speech normal.        Behavior: Behavior normal.        Thought Content: Thought content normal. Thought content is not paranoid or delusional. Thought content does not include homicidal or suicidal ideation. Thought content does not  include homicidal or suicidal plan.        Cognition and Memory: Cognition and memory normal.        Judgment: Judgment normal.     Comments: Insight intact     Lab Review:  No results found for: NA, K, CL, CO2, GLUCOSE, BUN, CREATININE, CALCIUM, PROT, ALBUMIN, AST, ALT, ALKPHOS, BILITOT, GFRNONAA, GFRAA  No results found for: WBC, RBC, HGB, HCT, PLT, MCV, MCH, MCHC, RDW, LYMPHSABS, MONOABS, EOSABS, BASOSABS  No results found for: POCLITH, LITHIUM   No results found for: PHENYTOIN, PHENOBARB, VALPROATE, CBMZ   .res Assessment: Plan:   Patient seen for 30 minutes and greater than 50% of visit spent counseling patient and coordinating care, to include discussing possible treatment options to improve mood and anxiety.  Discussed potential benefits, risks, and side effects of increasing Cymbalta to 90 mg daily.  Discussed that this dose is above FDA approved amount.  Patient agrees to increase in Cymbalta.  Also counseled patient regarding strategies to help manage anxiety, to include possible strategies to decrease caregiver burden by thinking of ways others can assist that want to help and enlisting services from organizations to provide help with things like nursing care, meal preparation, or cleaning. Continue Trileptal 300 mg at bedtime. Recommend continuing psychotherapy with Marliss Czar, PhD. Patient to follow-up with this provider in 6 weeks or sooner if clinically indicated. Patient advised to contact office with any questions, adverse effects, or acute worsening in signs and symptoms.  Benicia was seen today for anxiety and depression.  Diagnoses and all orders for this visit:  Generalized anxiety disorder -     DULoxetine (CYMBALTA) 30 MG capsule; Take 1 capsule (30 mg total) by mouth daily. Take with 60 mg capsule to equal total dose of 90 mg. -     DULoxetine (CYMBALTA) 60 MG capsule;  Take 1 capsule (60 mg total) by mouth daily. Take 1 capsule with 60 mg to equal total dose of  90 mg -     Oxcarbazepine (TRILEPTAL) 300 MG tablet; Take 1 tablet (300 mg total) by mouth at bedtime.  Episodic mood disorder (HCC) -     DULoxetine (CYMBALTA) 30 MG capsule; Take 1 capsule (30 mg total) by mouth daily. Take with 60 mg capsule to equal total dose of 90 mg. -     Oxcarbazepine (TRILEPTAL) 300 MG tablet; Take 1 tablet (300 mg total) by mouth at bedtime.  Insomnia due to other mental disorder -     Oxcarbazepine (TRILEPTAL) 300 MG tablet; Take 1 tablet (300 mg total) by mouth at bedtime.     Please see After Visit Summary for patient specific instructions.  Future Appointments  Date Time Provider Department Center  11/09/2019  9:00 AM Robley FriesMitchum, Robert, PhD CP-CP None  11/12/2019  8:00 AM Robley FriesMitchum, Robert, PhD CP-CP None  11/16/2019  8:00 AM Robley FriesMitchum, Robert, PhD CP-CP None  12/01/2019 10:00 AM Robley FriesMitchum, Robert, PhD CP-CP None  12/07/2019  9:00 AM Robley FriesMitchum, Robert, PhD CP-CP None  12/10/2019  2:00 PM Robley FriesMitchum, Robert, PhD CP-CP None  12/14/2019  8:00 AM Robley FriesMitchum, Robert, PhD CP-CP None  12/17/2019 10:00 AM Robley FriesMitchum, Robert, PhD CP-CP None  12/20/2019  8:30 AM Corie Chiquitoarter, Jevante Hollibaugh, PMHNP CP-CP None  12/21/2019  9:00 AM Robley FriesMitchum, Robert, PhD CP-CP None  12/24/2019 10:00 AM Robley FriesMitchum, Robert, PhD CP-CP None  01/25/2020  8:00 AM Carlus PavlovGherghe, Cristina, MD [facility] None    No orders of the defined types were placed in this encounter.   -------------------------------

## 2019-11-09 ENCOUNTER — Ambulatory Visit: Payer: BC Managed Care – PPO | Admitting: Psychiatry

## 2019-11-09 DIAGNOSIS — F411 Generalized anxiety disorder: Secondary | ICD-10-CM | POA: Diagnosis not present

## 2019-11-09 DIAGNOSIS — M545 Low back pain, unspecified: Secondary | ICD-10-CM

## 2019-11-09 DIAGNOSIS — Z630 Problems in relationship with spouse or partner: Secondary | ICD-10-CM

## 2019-11-09 DIAGNOSIS — F322 Major depressive disorder, single episode, severe without psychotic features: Secondary | ICD-10-CM

## 2019-11-09 DIAGNOSIS — F99 Mental disorder, not otherwise specified: Secondary | ICD-10-CM

## 2019-11-09 DIAGNOSIS — E1100 Type 2 diabetes mellitus with hyperosmolarity without nonketotic hyperglycemic-hyperosmolar coma (NKHHC): Secondary | ICD-10-CM

## 2019-11-09 DIAGNOSIS — Z636 Dependent relative needing care at home: Secondary | ICD-10-CM

## 2019-11-09 DIAGNOSIS — F5105 Insomnia due to other mental disorder: Secondary | ICD-10-CM | POA: Diagnosis not present

## 2019-11-09 NOTE — Progress Notes (Signed)
Psychotherapy Progress Note [facility] [name], PhD LP  Patient ID: Meagan Moran     MRN: [id number]     Therapy format: Individual psychotherapy Date: 11/09/2019      Start: 9:20a     Stop: 10:10a     Time Spent: 50 min Location: In-person   Session narrative (presenting needs, interim history, self-report of stressors and symptoms, applications of prior therapy, status changes, and interventions made in session) Med check yesterday, Cymbalta now 90mg  a/o this morning.  Does feel a bit more mental energy.  [name] says he sees signs of improvement overall, still cautious.  Remain same bed.  Up 4am today, attributed to too many things going on, but it's typical -- consistent with her working days.  Typical bedtime 9:30-10pm bedtime, if she had 2 hrs sleep in the day.  Acknowledges it will be disorienting if she naps over an hour.  Some days will be able to rise 5, 6, 7am.  Overall, advised OK to rise early if restless, just respect the sleeper's environment, don't take Cymbalta early, and be ready to de-catastrophize it for H and self.  Otherwise, try to establish more like 6 or 7am as reliable rise time, maintaining non-catastrophic orientation to activating for the day.  Recommend tell self before sleep, it's OK if she does hang out in sleep closer to 7a.   Probed the issue of "pretending" -- says it's not particularly anxious, just trying to control [name]'s triggers to worry and/or catastrophize.  Reaffirmed principle of self-determination, i.e., "let the adult owner of the body make the call."  C/o unwanted tasks cooking, and chafing about H's meat and carb heavy demands for food, which conflicts with her intentions.  [name] usually falls asleep in the chair in the evening.  PT may pretend to sleep, just breathe, relax, pray in order to keep the peace.  More careful not to do [name]'s preferred chores, but still an internal fight to put down compulsive thoughts of needing to  clean up even if it's not her agreed duty.  Maintaining patience with a lot of lingering projects of [name]'s.  They have talked about hiring maid service, have coordinated about bills and finances lately, have made sure accounts are paid up for the next couple months.  Have insurance set up for next year -- 80/20 state plan, and H will go on a Medicare Advantage plan.    B.s. alarm went off in session -- 69, paused for a sweet bite.  Therapeutic modalities: Cognitive Behavioral Therapy and Solution-Oriented/Positive Psychology  Mental Status/Observations:  Appearance:   Casual     Behavior:  Appropriate  Motor:  Normal  Speech/Language:   Clear and Coherent  Affect:  Appropriate  Mood:  anxious and dysthymic  Thought process:  normal  Thought content:    WNL  Sensory/Perceptual disturbances:    WNL  Orientation:  Fully oriented  Attention:  Good  Concentration:  Fair  Memory:  grossly intact  Insight:    Good  Judgment:   Good  Impulse Control:  Fair   Risk Assessment: Danger to Self: No Self-injurious Behavior: No Danger to Others: No Physical Aggression / Violence: No Duty to Warn: No Access to Firearms a concern: No  Assessment of progress:  progressing  Diagnosis:   ICD-10-CM   1. Generalized anxiety disorder  F41.1   2. Insomnia due to other mental disorder  F51.05    F99   3. Current severe episode of  major depressive disorder without psychotic features, unspecified whether recurrent (HCC)  F32.2   4. Relationship problem between partners  Z63.0   5. Low back pain, unspecified back pain laterality, unspecified chronicity, unspecified whether sciatica present  M54.5   6. Caregiver stress  Z63.6   7. Type 2 diabetes mellitus with hyperosmolarity without coma, unspecified whether long term insulin use (HCC)  E11.00     Plan:  . Self-monitor for Cymbalta effect and any overstimulation . Continue discretion over doing chores, not intruding on [name]'s . Try not to pretend  much, but if it's a diplomatic decision, should not be corrosive . Maintain sensible blood sugar control . Other recommendations/advice as noted above . Continue to utilize previously learned skills ad lib . Maintain medication as prescribed and work faithfully with relevant prescriber(s) if any changes are desired or seem indicated . Call the clinic on-call service, present to ER, or call 911 if any life-threatening psychiatric crisis . [name] is scheduled for 12/22 @ 4pm -- PT may join at his discretion Return for session(s) already scheduled. Current [facility] appointments: Future Appointments  Date Time Provider Department Center  11/12/2019  8:00 AM Robley Fries, PhD CP-CP None  11/16/2019  8:00 AM Robley Fries, PhD CP-CP None  12/01/2019 10:00 AM Robley Fries, PhD CP-CP None  12/07/2019  9:00 AM Robley Fries, PhD CP-CP None  12/10/2019  2:00 PM Robley Fries, PhD CP-CP None  12/14/2019  8:00 AM Robley Fries, PhD CP-CP None  12/17/2019 10:00 AM Robley Fries, PhD CP-CP None  12/20/2019  8:30 AM Corie Chiquito, PMHNP CP-CP None  12/21/2019  9:00 AM Robley Fries, PhD CP-CP None  12/24/2019 10:00 AM Robley Fries, PhD CP-CP None  01/25/2020  8:00 AM Carlus Pavlov, MD [facility] None    Robley Fries, PhD Marliss Czar, PhD LP Clinical Psychologist, [facility] [address] [place], [place] [address] [phone]

## 2019-11-10 ENCOUNTER — Other Ambulatory Visit: Payer: Self-pay | Admitting: Psychiatry

## 2019-11-10 DIAGNOSIS — F411 Generalized anxiety disorder: Secondary | ICD-10-CM

## 2019-11-12 ENCOUNTER — Ambulatory Visit (INDEPENDENT_AMBULATORY_CARE_PROVIDER_SITE_OTHER): Payer: BC Managed Care – PPO | Admitting: Psychiatry

## 2019-11-12 ENCOUNTER — Other Ambulatory Visit: Payer: Self-pay

## 2019-11-12 DIAGNOSIS — E1100 Type 2 diabetes mellitus with hyperosmolarity without nonketotic hyperglycemic-hyperosmolar coma (NKHHC): Secondary | ICD-10-CM

## 2019-11-12 DIAGNOSIS — F99 Mental disorder, not otherwise specified: Secondary | ICD-10-CM

## 2019-11-12 DIAGNOSIS — F411 Generalized anxiety disorder: Secondary | ICD-10-CM

## 2019-11-12 DIAGNOSIS — F322 Major depressive disorder, single episode, severe without psychotic features: Secondary | ICD-10-CM

## 2019-11-12 DIAGNOSIS — F5105 Insomnia due to other mental disorder: Secondary | ICD-10-CM | POA: Diagnosis not present

## 2019-11-12 DIAGNOSIS — Z636 Dependent relative needing care at home: Secondary | ICD-10-CM

## 2019-11-12 DIAGNOSIS — Z630 Problems in relationship with spouse or partner: Secondary | ICD-10-CM
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
## 2019-11-16 ENCOUNTER — Other Ambulatory Visit: Payer: Self-pay

## 2019-11-16 ENCOUNTER — Ambulatory Visit (INDEPENDENT_AMBULATORY_CARE_PROVIDER_SITE_OTHER): Payer: BC Managed Care – PPO | Admitting: Psychiatry

## 2019-11-16 DIAGNOSIS — F411 Generalized anxiety disorder: Secondary | ICD-10-CM

## 2019-11-16 DIAGNOSIS — M545 Low back pain, unspecified: Secondary | ICD-10-CM

## 2019-11-16 DIAGNOSIS — E1100 Type 2 diabetes mellitus with hyperosmolarity without nonketotic hyperglycemic-hyperosmolar coma (NKHHC): Secondary | ICD-10-CM

## 2019-11-16 DIAGNOSIS — F322 Major depressive disorder, single episode, severe without psychotic features: Secondary | ICD-10-CM | POA: Diagnosis not present

## 2019-11-16 DIAGNOSIS — Z630 Problems in relationship with spouse or partner: Secondary | ICD-10-CM

## 2019-11-16 NOTE — Progress Notes (Signed)
Psychotherapy Progress Note [facility] Marliss Czar, PhD LP  Patient ID: Meagan Moran     MRN: [id number]     Therapy format: Individual psychotherapy Date: 11/16/2019      Start: 8:20a     Stop: 9:10a     Time Spent: 50 min Location: In-person   Session narrative (presenting needs, interim history, self-report of stressors and symptoms, applications of prior therapy, status changes, and interventions made in session) Back doing better.  One argument after another lately, seems to be the tension of [name] coming up to surgery 12/29, but he has moved out of the bedroom again.  He has been counting sugar, having gotten the message that cancer "likes" sugar.  [name] has arranged for PT to see "wellness" doctor (chiropractor) for blood work and expense she does not want but feels she must comply to keep him content.   Interpreted how, with adversity in childhood and their respective mothers, H learned to "fly", Pt to"comply", and it sets them both up for conflict any time things feel overwhelming.  Key to de-escalating is for the pursuer to let up, allow the distancer to run and come back.  Meanwhile, B's wife has been involved, lots of uninvited advice.  [name] has forbidden (?) PT to talk to her B because of it.  He has also made decision not to have Pt at hospital, for fantasized concern she will worry the staff and/or him.  Seems to owe to his own burnout with his own mother, interpreted as transference and renewed how she can love him at home same as in waiting room and avoid pandemic risk.  Personally, still struggles with crying jags and "going around in circles".  Can surface and decide what to do next, often enough, but will get urgent sometime daily.  Affirmed taking attention in hand and redirecting when able.  Also introduced the idea of a "quiet minute" to just listen to ambient sounds, let physiological arousal calm down.  Notes she sometimes goes into the bathroom and  quietly yells at her reflection, which sounds like the practice of self-castigation.  Refreshed perspective that "It" (H's word for any unwanted mindset) is not an enemy but a difficult part of herself, perhaps old learning from family, but also  natural wants and needs, as a child, trying to get taken care of.  Going into the bathroom to chew "It" out basically amounts to self-shaming, so prefer she ask "It" to work with her than to tell "It to go away.  Perhaps better to consider "It" "[name]", in need of something, so better if "[name]" can work it out with her.  Reinforced with principles of faith   B.s. sensor broke over the weekend, had to go back to glucometer.  Removing it from skin requires heat and some extraordinary efforts.  H on schedule later today -- prepared to interpret same to him, but meanwhile, be ready to let him get distance when needed, come back when able.  Agrees.  Therapeutic modalities: Cognitive Behavioral Therapy, Solution-Oriented/Positive Psychology and faith-sensitive  Mental Status/Observations:  Appearance:   Casual     Behavior:  Appropriate  Motor:  slowed by pain, better  Speech/Language:   Clear and Coherent and slowed rate  Affect:  Appropriate and responsive to humor, support  Mood:  depressed  Thought process:  normal  Thought content:    WNL  Sensory/Perceptual disturbances:    WNL  Orientation:  Fully oriented  Attention:  Good  Concentration:  Good  Memory:  WNL  Insight:    Fair  Judgment:   Good  Impulse Control:  Good   Risk Assessment: Danger to Self: No Self-injurious Behavior: No Danger to Others: No Physical Aggression / Violence: No Duty to Warn: No Access to Firearms a concern: No  Assessment of progress:  stabilized  Diagnosis:   ICD-10-CM   1. Generalized anxiety disorder  F41.1   2. Current severe episode of major depressive disorder without psychotic features, unspecified whether recurrent ([place])  F32.2   3. Type 2  diabetes mellitus with hyperosmolarity without coma, unspecified whether long term insulin use (HCC)  E11.00   4. Relationship problem between partners  Z63.0   5. Low back pain, unspecified back pain laterality, unspecified chronicity, unspecified whether sciatica present  M54.5    Plan:  Marland Kitchen Momentary relaxers, e.g., quiet minute, breathing . Emphasize letting H get distance PRN . Practice positive regard for unwanted self, re-brad "little Donnabelle" and help her instead of castigate . Other recommendations/advice as noted above . Continue to utilize previously learned skills ad lib . Maintain medication as prescribed and work faithfully with relevant prescriber(s) if any changes are desired or seem indicated . Call the clinic on-call service, present to ER, or call 911 if any life-threatening psychiatric crisis Return in about 1 week (around 11/23/2019) for session(s) already scheduled. Current [facility] appointments: Future Appointments  Date Time Provider [facility]  12/01/2019 10:00 AM [name], PhD CP-CP None  12/07/2019  9:00 AM [name], PhD CP[facility] None  12/10/2019  2:00 PM [name], PhD CP[facility] None  12/14/2019  8:00 AM [name], PhD [facility] None  12/17/2019 10:00 AM [name], PhD CP[facility] None  12/20/2019  8:30 AM Thayer Headings, PMHNP CP-CP None  12/21/2019  9:00 AM [name], PhD CP[facility] None  12/24/2019 10:00 AM [name], PhD CP[facility] None  01/25/2020  8:00 AM Philemon Kingdom, MD [facility] None    [name], PhD [name], PhD LP Clinical Psychologist, [facility] [address], [place], [place] [address] [phone]

## 2019-11-30 ENCOUNTER — Encounter: Payer: Self-pay | Admitting: Internal Medicine

## 2019-12-01 ENCOUNTER — Ambulatory Visit: Payer: BC Managed Care – PPO | Admitting: Psychiatry

## 2019-12-07 ENCOUNTER — Ambulatory Visit (INDEPENDENT_AMBULATORY_CARE_PROVIDER_SITE_OTHER): Payer: BC Managed Care – PPO | Admitting: Psychiatry

## 2019-12-07 ENCOUNTER — Other Ambulatory Visit: Payer: Self-pay

## 2019-12-07 DIAGNOSIS — F411 Generalized anxiety disorder: Secondary | ICD-10-CM | POA: Diagnosis not present

## 2019-12-07 DIAGNOSIS — F17210 Nicotine dependence, cigarettes, uncomplicated: Secondary | ICD-10-CM

## 2019-12-07 DIAGNOSIS — E1100 Type 2 diabetes mellitus with hyperosmolarity without nonketotic hyperglycemic-hyperosmolar coma (NKHHC): Secondary | ICD-10-CM | POA: Diagnosis not present

## 2019-12-07 DIAGNOSIS — F322 Major depressive disorder, single episode, severe without psychotic features: Secondary | ICD-10-CM

## 2019-12-07 DIAGNOSIS — M545 Low back pain, unspecified: Secondary | ICD-10-CM
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
## 2019-12-10 ENCOUNTER — Other Ambulatory Visit: Payer: Self-pay

## 2019-12-10 ENCOUNTER — Ambulatory Visit (INDEPENDENT_AMBULATORY_CARE_PROVIDER_SITE_OTHER): Payer: BC Managed Care – PPO | Admitting: Psychiatry

## 2019-12-10 DIAGNOSIS — F5105 Insomnia due to other mental disorder: Secondary | ICD-10-CM | POA: Diagnosis not present

## 2019-12-10 DIAGNOSIS — F322 Major depressive disorder, single episode, severe without psychotic features: Secondary | ICD-10-CM | POA: Diagnosis not present

## 2019-12-10 DIAGNOSIS — E1100 Type 2 diabetes mellitus with hyperosmolarity without nonketotic hyperglycemic-hyperosmolar coma (NKHHC): Secondary | ICD-10-CM

## 2019-12-10 DIAGNOSIS — Z630 Problems in relationship with spouse or partner: Secondary | ICD-10-CM

## 2019-12-10 DIAGNOSIS — Z636 Dependent relative needing care at home: Secondary | ICD-10-CM

## 2019-12-10 DIAGNOSIS — F411 Generalized anxiety disorder: Secondary | ICD-10-CM

## 2019-12-10 DIAGNOSIS — F99 Mental disorder, not otherwise specified: Secondary | ICD-10-CM

## 2019-12-10 NOTE — Progress Notes (Signed)
Psychotherapy Progress Note [facility] Marliss Czar, PhD LP  Patient ID: Meagan Moran     MRN: [id number]     Therapy format: Family therapy w/ patient -- accompanied by husband [name] Date: 12/10/2019      Start: 2:13p     Stop: 3:15p     Time Spent: 62 min Location: In-person   Session narrative (presenting needs, interim history, self-report of stressors and symptoms, applications of prior therapy, status changes, and interventions made in session) 5 days now regular 60mg  Cymbalta, using a weekly pillbox to steady up.  Says she is suffering for not "being the wife needs her to be" -- caring, solicitous, capable right now while he is recovering from prostatectomy.    Unfortunately, says H has brought up separating, which amps up her anxiety, fear of being rejected, deficient, etc.  Strongly advised H not to make it about "having a wife" or "moving out" but keep it all about taking a break if/when he needs to bring down anger.  Strongly advised PT to remember he's not going anywhere, ultimately, he's just frustrated, and any task she is trying to perform -- cooking, cleaning wound dressing -- is not a "final exam".  Reiterated a no-shaming approach to these tasks and brokered an agreement to go home, spend a little time just enjoying each other's company.  Led through breathing, mindfulness based relaxation in session to de-escalate obsessing about failing H then led her through imaginal practice dressing a wound. Rated herself more calm and capable, suggested she will fel that again when the time comes at home and brokered agreement to do the same things -- stop, breathe, listen, then just do the procedure -- at home.    Blood sugar steady 2.5 weeks   Therapeutic modalities: Cognitive Behavioral Therapy and Solution-Oriented/Positive Psychology  Mental Status/Observations:  Appearance:   Casual and better groomed     Behavior:  Appropriate  Motor:  Normal   Speech/Language:   Clear and Coherent  Affect:  Appropriate  Mood:  anxious and dysthymic  Thought process:  normal  Thought content:    WNL  Sensory/Perceptual disturbances:    WNL  Orientation:  Fully oriented  Attention:  Good  Concentration:  Good  Memory:  grossly intact  Insight:    Good  Judgment:   Good  Impulse Control:  Good   Risk Assessment: Danger to Self: No Self-injurious Behavior: No Danger to Others: No Physical Aggression / Violence: No Duty to Warn: No Access to Firearms a concern: No  Assessment of progress:  progressing  Diagnosis:   ICD-10-CM   1. Generalized anxiety disorder  F41.1   2. Current severe episode of major depressive disorder without psychotic features, unspecified whether recurrent (HCC)  F32.2   3. Insomnia due to other mental disorder  F51.05    F99   4. Relationship problem between partners  Z63.0   5. Type 2 diabetes mellitus with hyperosmolarity without coma, unspecified whether long term insulin use (HCC)  E11.00   6. Caregiver stress  Z63.6    Plan:  . Maintain regularity with Cymbalta dose . H tone down rhetoric about having a wife and possibly leaving, PT keep faith that it will not come to separating, and both take some time to just hang out when able . Use breathing/mindfulness technique to bring down anxiety ad lib . Other recommendations/advice as noted above . Continue to utilize previously learned skills ad lib . Maintain medication as  prescribed and work faithfully with relevant prescriber(s) if any changes are desired or seem indicated . Call the clinic on-call service, present to ER, or call 911 if any life-threatening psychiatric crisis Return for session(s) already scheduled. Next scheduled in this office 12/14/2019  [name], PhD [name], PhD LP Clinical Psychologist, [place] [facility] [address] [place], [place] [address] [phone]

## 2019-12-14 ENCOUNTER — Other Ambulatory Visit: Payer: Self-pay

## 2019-12-14 ENCOUNTER — Ambulatory Visit: Payer: BC Managed Care – PPO | Admitting: Psychiatry

## 2019-12-14 DIAGNOSIS — Z630 Problems in relationship with spouse or partner: Secondary | ICD-10-CM

## 2019-12-14 DIAGNOSIS — F322 Major depressive disorder, single episode, severe without psychotic features: Secondary | ICD-10-CM | POA: Diagnosis not present

## 2019-12-14 DIAGNOSIS — F411 Generalized anxiety disorder: Secondary | ICD-10-CM

## 2019-12-14 DIAGNOSIS — E1100 Type 2 diabetes mellitus with hyperosmolarity without nonketotic hyperglycemic-hyperosmolar coma (NKHHC): Secondary | ICD-10-CM

## 2019-12-14 DIAGNOSIS — F17210 Nicotine dependence, cigarettes, uncomplicated: Secondary | ICD-10-CM

## 2019-12-14 DIAGNOSIS — M545 Low back pain, unspecified: Secondary | ICD-10-CM

## 2019-12-14 NOTE — Progress Notes (Signed)
Psychotherapy Progress Note [facility] Marliss Czar, PhD LP  Patient ID: Meagan Moran     MRN: [id number]     Therapy format: Individual psychotherapy Date: 12/14/2019      Start: 8:18a     Stop: 9:08a     Time Spent: 50 min Location: In-person   Session narrative (presenting needs, interim history, self-report of stressors and symptoms, applications of prior therapy, status changes, and interventions made in session) Brings notes from H, who says, colorfully, "She's waiting for a magic pill" and "Why is she living in the bathroom?"  She says it's persistent diarrhea (anxiety-driven IBS-D), for which Pepto-Bismol not working very well.  Does have a PCP appt on schedule, is getting Gatorade to maintain electrolytes.  But also it's quiet time, playing her dice game, on the phone, in the relative quiet, avoiding conflict.  It is challenging trying to dress H's prostatectomy wound -- says it varies how able she is to get through it for worrying about what might be going on with it and whether she will make a mistake.  Challenged to approach it the way she would a child's cut -- go in because you care, get through dressing the wound, then you can think about it afterward.  "Frustrating...trying to get all organized ..." and get things perfect.  Challenged to let something be messy.    Still hiding smoking, episodically, from H.  Reminded it doesn't help BP, and nicotine "likes to be in charge" vs. other antidepressants.  States she has been reliable with Cymbalta since Friday, and it's helping.  Encouraged to continue steady dosing and letting her brain wake up better without disrupting it with back-and-forth chemistry conditions.  Noted how, when asked about medication, she automatically answered how things are "fair" between her and [name] -- his pleasure/displeasure with her dominates her thinking that much, more than she knows, and a lot of her anxiety is tied to worry which  reactions she is going to get.  Reframed her task, and her way to get him to relax about her, is not to just "snap to", even if he seems to pressure her, but to show she can settle herself to focus.  Continued to reframe that if she gets a little irritated and pushes back against the verbal pressure he may (knowingly or unknowingly) put o, he will see and feel that as strength on her part, the "alpha" he actually does want her to be.    Frustrated that H going to chiropractor, believes it works his surgery wound, may complicate healing.  [name] is passing clots from his wounds and frequently sending his doctor pictures, getting slow response.  Agreed [name] is working his own healthcare relationships and most likely needs to feel free to do that for himself.  Kids in touch trying to guide her, guide H, PT feels pressure from it all, starts to get thoughts of it all being her fault.  Confronted fault-finding itself as a problem, urged to be ready to tell her own thoughts to shut up, doing the next realistic thing is enough to be concerned with.  Preoccupied with not being able to make everything work, but has a Multimedia programmer coming in.    Discussed possibility of [employer] program if may need small repairs or in-home assistance with a task.  Encouraged to be in touch with [name]'s PCP about having a home health assessment if any concern about not keeping up with postsurgical  needs.  Says they never had any, urologist's office said they "don't do that" -- ?).  Also informed that their insurance company's nurse advisory line (his [employer], provided nurse hotline number).  Therapeutic modalities: Cognitive Behavioral Therapy and Solution-Oriented/Positive Psychology  Mental Status/Observations:  Appearance:   Casual     Behavior:  Appropriate  Motor:  Normal and excpet pain  Speech/Language:   slowed but more cogent, fluent  Affect:  Appropriate and a bit shaken, less  Mood:  anxious  and depressed  Thought process:  slowed, vague sometimes  Thought content:    worrisome  Sensory/Perceptual disturbances:    WNL  Orientation:  Fully oriented  Attention:  Fair  Concentration:  Fair  Memory:  limited STM  Insight:    Fair  Judgment:   Good  Impulse Control:  Good   Risk Assessment: Danger to Self: No Self-injurious Behavior: No Danger to Others: No Physical Aggression / Violence: No Duty to Warn: No Access to Firearms a concern: No  Assessment of progress:  progressing  Diagnosis:   ICD-10-CM   1. Generalized anxiety disorder  F41.1   2. Current severe episode of major depressive disorder without psychotic features, unspecified whether recurrent ([place])  F32.2   3. Relationship problem between partners  Z63.0   4. Cigarette nicotine dependence, uncomplicated  S85.462   5. Type 2 diabetes mellitus with hyperosmolarity without coma, unspecified whether long term insulin use (HCC)  E11.00   6. Low back pain, unspecified back pain laterality, unspecified chronicity, unspecified whether sciatica present  M54.5    Plan:  . Practice letting things be messy or imperfect, backing down self-demands . Practice more assertive attitude toward both interna and external (implied) demands . Approach dressing H's wound later today, in the frame of mind she would one of her children . Other recommendations/advice as noted above . Continue to utilize previously learned skills ad lib . Maintain medication as prescribed and work faithfully with relevant prescriber(s) if any changes are desired or seem indicated . Call the clinic on-call service, present to ER, or call 911 if any life-threatening psychiatric crisis Return for session(s) already scheduled. Next scheduled in this office 12/17/2019  [name], PhD [name], PhD LP Clinical Psychologist, [place] [facility] [address] [place], [place] [address] [phone]

## 2019-12-17 ENCOUNTER — Other Ambulatory Visit: Payer: Self-pay

## 2019-12-17 ENCOUNTER — Ambulatory Visit: Payer: BC Managed Care – PPO | Admitting: Psychiatry

## 2019-12-17 DIAGNOSIS — F331 Major depressive disorder, recurrent, moderate: Secondary | ICD-10-CM | POA: Diagnosis not present

## 2019-12-17 DIAGNOSIS — E1100 Type 2 diabetes mellitus with hyperosmolarity without nonketotic hyperglycemic-hyperosmolar coma (NKHHC): Secondary | ICD-10-CM | POA: Diagnosis not present

## 2019-12-17 DIAGNOSIS — Z630 Problems in relationship with spouse or partner: Secondary | ICD-10-CM

## 2019-12-17 DIAGNOSIS — F411 Generalized anxiety disorder: Secondary | ICD-10-CM

## 2019-12-17 DIAGNOSIS — F17210 Nicotine dependence, cigarettes, uncomplicated: Secondary | ICD-10-CM

## 2019-12-17 DIAGNOSIS — M545 Low back pain, unspecified: Secondary | ICD-10-CM

## 2019-12-17 DIAGNOSIS — F99 Mental disorder, not otherwise specified: Secondary | ICD-10-CM

## 2019-12-17 DIAGNOSIS — F5105 Insomnia due to other mental disorder: Secondary | ICD-10-CM

## 2019-12-17 NOTE — Progress Notes (Signed)
Psychotherapy Progress Note [facility] Meagan Czar, PhD LP  Patient ID: Meagan Moran     MRN: [id number]     Therapy format: Individual psychotherapy Date: 12/17/2019      Start: 10:17a     Stop: 11:07a     Time Spent: 50 min Location: In-person   Session narrative (presenting needs, interim history, self-report of stressors and symptoms, applications of prior therapy, status changes, and interventions made in session) House cleaner came in yesterday, helpful, though not to husband's spec (a bit amusing to her for him to seem the perfectionist).  Pt camped out in a guest room to keep herself from trying to manage, help, kibitz, etc.  Relations with Meagan Moran are still fraught some with tension, but sleeping in same bed again.  He is getting a bit better at bowel/bladder control since his surgery.  Not letting her do as much with his wound care, but she is also shrewdly holding back from "following him around like a puppy dog" to be compulsive caretaker.  Is a hard fight sometimes to hold back, but does seem to understand the point that pestering feels like forcing, husband is "allergic" to that from his own mother experience, and if it comes across as coercive, it cancels the love she intends because he knows instinctively it is trying to fix her self-image and quiet internal criticism by getting him to conform to the image of what she should be doing to be a "good" person and wife.   Oriented to this as codependency, and as a kind of addiction (making me look good to myself by requiring you to be); recharacterized impulse control as "wise love" instead.  For moments she may try to persist asking what she can do for him, advised a 3-strikes policy (I.e., don't ask "Are you sure?" more than once).  And low-risk phrasing ("Is there anything you need?").    Claims to be struggling with blood sugar, though it is getting better.  Trying to claim the freedom to eat differently from Meagan Moran,  e.g., make rice for herself.  Disgusted by some of his foods, like pork rinds.    Continues diaphragmatic breathing as a tension releaser several times a day.  Better able to step back and notice if she is thinking exaggerated distress thoughts.  Meagan Moran still likes to call her inner voice "It", which has unfortunate overtones (can sound like her mother, calling her inhuman), so refreshed the idea of adopting another name, like "Meagan Moran", and framing dealing with herself and her distress more like having a supportive confrontation with a misguided friend.    Clearly more alert and cognitively present today, evidence of Cymbalta, regularly dosed, hitting the spot for alertness.  Discussed medication perspective, option of dose increase or go steady, siding with hold-steady as the best way to see her capabilities in action rather than subtly feel like it's "just" the pill holding her "mess" together.    Therapeutic modalities: Cognitive Behavioral Therapy and Solution-Oriented/Positive Psychology  Mental Status/Observations:  Appearance:   Casual     Behavior:  Appropriate  Motor:  Normal  Speech/Language:   Clear and Coherent  Affect:  Appropriate and more responsive and energetic  Mood:  anxious, depressed and decidedly less  Thought process:  normal and worrisome, less blocking  Thought content:    worries  Sensory/Perceptual disturbances:    WNL  Orientation:  Fully oriented  Attention:  Good  Concentration:  Fair  Memory:  STM limitations  Insight:    Fair  Judgment:   Good  Impulse Control:  Good   Risk Assessment: Danger to Self: No Self-injurious Behavior: No Danger to Others: No Physical Aggression / Violence: No Duty to Warn: No Access to Firearms a concern: No  Assessment of progress:  progressing  Diagnosis:   ICD-10-CM   1. Generalized anxiety disorder  F41.1   2. Major depressive disorder, recurrent episode, moderate (HCC)  F33.1    Plan:  . Continue to practice  checking herself against pestering, trusting that it helps . Use open questions if Meagan Moran could use something done . Be sure not to overreact to blood sugar readings and to be sure they are interpreted correctly . Agreed 60mg  Cymbalta is probably still improving, hold steady unless clearly more interested and prescriber consents . Other recommendations/advice as noted above . Continue to utilize previously learned skills ad lib . Maintain medication as prescribed and work faithfully with relevant prescriber(s) if any changes are desired or seem indicated . Call the clinic on-call service, present to ER, or call 911 if any life-threatening psychiatric crisis Return for session(s) already scheduled. Next scheduled in this office 12/20/2019  Meagan Serve, PhD Meagan Moore, PhD LP Clinical Psychologist, [facility] [address], [place], [place] [address] [phone]

## 2019-12-20 ENCOUNTER — Ambulatory Visit (INDEPENDENT_AMBULATORY_CARE_PROVIDER_SITE_OTHER): Payer: BC Managed Care – PPO | Admitting: Psychiatry

## 2019-12-20 ENCOUNTER — Encounter: Payer: Self-pay | Admitting: Psychiatry

## 2019-12-20 DIAGNOSIS — F411 Generalized anxiety disorder: Secondary | ICD-10-CM

## 2019-12-20 DIAGNOSIS — F5105 Insomnia due to other mental disorder: Secondary | ICD-10-CM

## 2019-12-20 DIAGNOSIS — F39 Unspecified mood [affective] disorder: Secondary | ICD-10-CM

## 2019-12-20 DIAGNOSIS — F99 Mental disorder, not otherwise specified: Secondary | ICD-10-CM | POA: Diagnosis not present

## 2019-12-20 MED ORDER — DULOXETINE HCL 60 MG PO CPEP: 60.0000 mg | ORAL_CAPSULE | Freq: Every day | ORAL | 0 refills | Status: DC

## 2019-12-20 MED ORDER — BUSPIRONE HCL 30 MG PO TABS: 30.0000 mg | ORAL_TABLET | Freq: Two times a day (BID) | ORAL | 0 refills | Status: DC

## 2019-12-20 MED ORDER — OXCARBAZEPINE 300 MG PO TABS: 300.0000 mg | ORAL_TABLET | Freq: Every day | ORAL | 0 refills | Status: DC

## 2019-12-20 MED ORDER — ALPRAZOLAM 0.5 MG PO TABS: 0.5000 mg | ORAL_TABLET | Freq: Three times a day (TID) | ORAL | 1 refills | Status: DC | PRN

## 2019-12-20 NOTE — Progress Notes (Signed)
Meagan Moran [id number] 04/22/1956 64 y.o.  Virtual Visit via Telephone Note  I connected with pt on 12/20/19 at  8:30 AM EST by telephone and verified that I am speaking with the correct person using two identifiers.   I discussed the limitations, risks, security and privacy concerns of performing an evaluation and management service by telephone and the availability of in person appointments. I also discussed with the patient that there may be a patient responsible charge related to this service. The patient expressed understanding and agreed to proceed.   I discussed the assessment and treatment plan with the patient. The patient was provided an opportunity to ask questions and all were answered. The patient agreed with the plan and demonstrated an understanding of the instructions.   The patient was advised to call back or seek an in-person evaluation if the symptoms worsen or if the condition fails to improve as anticipated.  I provided 25 minutes of non-face-to-face time during this encounter.  The patient was located at home.  The provider was located at [place].   Thayer Headings, PMHNP   Subjective:   Patient ID:  Meagan Moran is a 64 y.o. (DOB 1956/07/13) female.  Chief Complaint:  Chief Complaint  Patient presents with  . Follow-up    Depression, anxiety, and sleep disturbance    HPI Meagan Moran CRYSTA GULICK presents for follow-up of depression, anxiety, and insomnia. She reports that she has consistent worry about her husband who is being tx'd for prostate CA. She reports occ diarrhea and vomiting during times of increased anxiety. She reports that her BP has also been fluctuating. Denies full blown panic attacks. Describes mood as, "up and down. It's been fair." Denies any recent persistent depression. Has some irritability "mostly to myself" and describes frustration in response to certain things not getting done in the house. Sleeping well at night. She reports  that her appetite has been good. Energy and motivation is "up and down," and fluctuates based on task. Concentration has been ok. Denies SI.   Husband had prostatectomy and she has been trying to help care for him. Son, daughter, and husband helped her prepare medications before his surgery and reports that this was helpful. Reports that children helped create a chart to help her decide what she should and should not do.   Typically taking Alprazolam 1-2 times daily and on rare occasions taking up to 3 times daily.  Past Psychiatric Medication Trials: Cymbalta- Effective for anxiety at 60 mg po qd.  Prozac- side effects Wellbutrin- side effects Trileptal- Effective. Caused nightmares. Was taking 300 mg po BID. Xanax  Review of Systems:  Review of Systems  Gastrointestinal: Positive for diarrhea and vomiting.  Musculoskeletal: Negative for gait problem.  Neurological: Negative for tremors and headaches.  Psychiatric/Behavioral:       Please refer to HPI    Medications: I have reviewed the patient's current medications.  Current Outpatient Medications  Medication Sig Dispense Refill  . ALPRAZolam (XANAX) 0.5 MG tablet Take 1 tablet (0.5 mg total) by mouth 3 (three) times daily as needed for anxiety. 90 tablet 1  . amLODipine (NORVASC) 5 MG tablet TAKE 1 TABLET EVERY DAY AT LUNCH    . busPIRone (BUSPAR) 30 MG tablet Take 1 tablet (30 mg total) by mouth 2 (two) times daily. 180 tablet 0  . DULoxetine (CYMBALTA) 60 MG capsule Take 1 capsule (60 mg total) by mouth daily. 90 capsule 0  . HYDROcodone-acetaminophen (NORCO) 7.5-325 MG tablet     .  losartan (COZAAR) 100 MG tablet Take 100 mg by mouth daily.    . Omega-3 Fatty Acids (FISH OIL PO) Take by mouth.    . Oxcarbazepine (TRILEPTAL) 300 MG tablet Take 1 tablet (300 mg total) by mouth at bedtime. 90 tablet 0  . pravastatin (PRAVACHOL) 20 MG tablet Take 20 mg by mouth daily.    Marland Kitchen tiZANidine (ZANAFLEX) 4 MG tablet TAKE 1 TABLET 4 TIMES  A DAY AS NEEDED FOR MUSCLE SPASMS    . VITAMIN D PO Take by mouth.    . Continuous Blood Gluc Receiver (FREESTYLE LIBRE 2 READER SYSTM) DEVI 1 each by Does not apply route once for 1 dose. 1 each 0  . Continuous Blood Gluc Sensor (FREESTYLE LIBRE 2 SENSOR SYSTM) MISC 1 each by Does not apply route every 14 (fourteen) days. 6 each 3  . glucagon (GLUCAGON EMERGENCY) 1 MG injection Inject 1 mg into the muscle once as needed for up to 1 dose. 1 each 12  . hydrochlorothiazide (MICROZIDE) 12.5 MG capsule Take 12.5 mg by mouth daily as needed.    . metFORMIN (GLUCOPHAGE) 500 MG tablet Take 1 tablet (500 mg total) by mouth daily with breakfast. (Patient not taking: Reported on 12/20/2019) 90 tablet 3   No current facility-administered medications for this visit.    Medication Side Effects: None  Allergies:  Allergies  Allergen Reactions  . Nsaids   . Sulfa Antibiotics     Past Medical History:  Diagnosis Date  . Back pain   . Diabetes mellitus, type II (HCC)   . Heart murmur   . Sleep apnea     Family History  Problem Relation Age of Onset  . Anxiety disorder Mother   . Anxiety disorder Maternal Grandmother   . Anxiety disorder Daughter     Social History   Socioeconomic History  . Marital status: Married    Spouse name: Not on file  . Number of children: Not on file  . Years of education: Not on file  . Highest education level: Not on file  Occupational History  . Not on file  Tobacco Use  . Smoking status: Current Some Day Smoker    Packs/day: 0.05    Types: Cigarettes  . Smokeless tobacco: Never Used  . Tobacco comment: Peaked at 1 ppd in 2019, on/off for years  Substance and Sexual Activity  . Alcohol use: Not Currently    Comment: "only on the holidays"  . Drug use: Not Currently  . Sexual activity: Not on file  Other Topics Concern  . Not on file  Social History Narrative  . Not on file   Social Determinants of Health   Financial Resource Strain:   .  Difficulty of Paying Living Expenses: Not on file  Food Insecurity:   . Worried About Programme researcher, broadcasting/film/video in the Last Year: Not on file  . Ran Out of Food in the Last Year: Not on file  Transportation Needs:   . Lack of Transportation (Medical): Not on file  . Lack of Transportation (Non-Medical): Not on file  Physical Activity:   . Days of Exercise per Week: Not on file  . Minutes of Exercise per Session: Not on file  Stress:   . Feeling of Stress : Not on file  Social Connections:   . Frequency of Communication with Friends and Family: Not on file  . Frequency of Social Gatherings with Friends and Family: Not on file  . Attends Religious Services: Not  on file  . Active Member of Clubs or Organizations: Not on file  . Attends Banker Meetings: Not on file  . Marital Status: Not on file  Intimate Partner Violence:   . Fear of Current or Ex-Partner: Not on file  . Emotionally Abused: Not on file  . Physically Abused: Not on file  . Sexually Abused: Not on file    Past Medical History, Surgical history, Social history, and Family history were reviewed and updated as appropriate.   Please see review of systems for further details on the patient's review from today.   Objective:   Physical Exam:  BP 112/80   Pulse 90   Wt 260 lb (117.9 kg)   BMI 35.26 kg/m   Physical Exam Neurological:     Mental Status: She is alert and oriented to person, place, and time.     Cranial Nerves: No dysarthria.  Psychiatric:        Attention and Perception: Attention and perception normal.        Mood and Affect: Mood is anxious.        Speech: Speech normal.        Behavior: Behavior is cooperative.        Thought Content: Thought content normal. Thought content is not paranoid or delusional. Thought content does not include homicidal or suicidal ideation. Thought content does not include homicidal or suicidal plan.        Cognition and Memory: Cognition and memory normal.         Judgment: Judgment normal.     Comments: Insight intact     Lab Review:  No results found for: NA, K, CL, CO2, GLUCOSE, BUN, CREATININE, CALCIUM, PROT, ALBUMIN, AST, ALT, ALKPHOS, BILITOT, GFRNONAA, GFRAA  No results found for: WBC, RBC, HGB, HCT, PLT, MCV, MCH, MCHC, RDW, LYMPHSABS, MONOABS, EOSABS, BASOSABS  No results found for: POCLITH, LITHIUM   No results found for: PHENYTOIN, PHENOBARB, VALPROATE, CBMZ   .res Assessment: Plan:   Discussed continuing current plan of care since there has been some improvement in stabilization in mood and anxiety signs and symptoms after family has assisted her with setting up system to take medications more consistently.  Discussed that overall her mood and anxiety signs and symptoms have seemed to stabilize in spite of several recent significant stressors.  Will therefore continue current plan of care without any medication changes at this time. Recommend continuing psychotherapy with Marliss Czar, PhD. Patient to follow-up in 2 to 3 months or sooner if clinically indicated. Patient advised to contact office with any questions, adverse effects, or acute worsening in signs and symptoms.  Frederica was seen today for follow-up.  Diagnoses and all orders for this visit:  Generalized anxiety disorder -     DULoxetine (CYMBALTA) 60 MG capsule; Take 1 capsule (60 mg total) by mouth daily. -     ALPRAZolam (XANAX) 0.5 MG tablet; Take 1 tablet (0.5 mg total) by mouth 3 (three) times daily as needed for anxiety. -     busPIRone (BUSPAR) 30 MG tablet; Take 1 tablet (30 mg total) by mouth 2 (two) times daily. -     Oxcarbazepine (TRILEPTAL) 300 MG tablet; Take 1 tablet (300 mg total) by mouth at bedtime.  Episodic mood disorder (HCC) -     Oxcarbazepine (TRILEPTAL) 300 MG tablet; Take 1 tablet (300 mg total) by mouth at bedtime.  Insomnia due to other mental disorder -     Oxcarbazepine (TRILEPTAL) 300 MG  tablet; Take 1 tablet (300 mg total) by mouth  at bedtime.    Please see After Visit Summary for patient specific instructions.  Future Appointments  Date Time Provider Department Center  12/21/2019  9:00 AM Robley Fries, PhD [facility] None  12/24/2019 10:00 AM Robley Fries, PhD [facility] None  12/28/2019  8:00 AM Robley Fries, PhD [facility] None  12/31/2019 10:00 AM Robley Fries, PhD [facility] None  01/04/2020  9:00 AM Robley Fries, PhD [facility] None  01/07/2020  9:00 AM Robley Fries, PhD [facility] None  01/11/2020  8:00 AM Robley Fries, PhD [facility] None  01/14/2020  9:00 AM Robley Fries, PhD [facility] None  01/18/2020  9:00 AM Robley Fries, PhD [facility] None  01/21/2020  9:00 AM Robley Fries, PhD [facility] None  01/25/2020  8:00 AM Carlus Pavlov, MD [facility] None  01/28/2020  9:00 AM Robley Fries, PhD [facility] None  02/01/2020  9:00 AM Robley Fries, PhD [facility] None  02/04/2020  8:00 AM Robley Fries, PhD [facility] None  02/07/2020  9:00 AM Robley Fries, PhD [facility] None  02/11/2020  9:00 AM Robley Fries, PhD [facility] None  02/15/2020  8:00 AM Robley Fries, PhD [facility] None  02/18/2020  9:00 AM Robley Fries, PhD [facility] None  02/22/2020  8:00 AM Robley Fries, PhD [facility] None    No orders of the defined types were placed in this encounter.     -------------------------------

## 2019-12-21 ENCOUNTER — Ambulatory Visit (INDEPENDENT_AMBULATORY_CARE_PROVIDER_SITE_OTHER): Payer: BC Managed Care – PPO | Admitting: Psychiatry

## 2019-12-21 ENCOUNTER — Other Ambulatory Visit: Payer: Self-pay

## 2019-12-21 DIAGNOSIS — F411 Generalized anxiety disorder: Secondary | ICD-10-CM | POA: Diagnosis not present

## 2019-12-21 DIAGNOSIS — F5105 Insomnia due to other mental disorder: Secondary | ICD-10-CM | POA: Diagnosis not present

## 2019-12-21 DIAGNOSIS — M545 Low back pain, unspecified: Secondary | ICD-10-CM

## 2019-12-21 DIAGNOSIS — F331 Major depressive disorder, recurrent, moderate: Secondary | ICD-10-CM

## 2019-12-21 DIAGNOSIS — Z630 Problems in relationship with spouse or partner: Secondary | ICD-10-CM | POA: Diagnosis not present

## 2019-12-21 DIAGNOSIS — F99 Mental disorder, not otherwise specified: Secondary | ICD-10-CM

## 2019-12-21 DIAGNOSIS — Z636 Dependent relative needing care at home: Secondary | ICD-10-CM

## 2019-12-21 DIAGNOSIS — E1100 Type 2 diabetes mellitus with hyperosmolarity without nonketotic hyperglycemic-hyperosmolar coma (NKHHC): Secondary | ICD-10-CM

## 2019-12-21 NOTE — Progress Notes (Signed)
Psychotherapy Progress Note [facility] [name], PhD LP  Patient ID: Meagan Moran     MRN: [id number] Therapy format: Individual psychotherapy Date: 12/21/2019      Start: 9:14a     Stop: 10:00a     Time Spent: 46 min Location: In-person   Session narrative (presenting needs, interim history, self-report of stressors and symptoms, applications of prior therapy, status changes, and interventions made in session) Meagan Moran scheduled her for PCP right after this.  Needs to check BP and how antihypertensive is working, plus update Dr. Tamala Julian on meds here.  Tomorrow chiropractor Micheline Chapman).  Frustrated, expects to "crash" when gets home today.  Tired of being "bugged" by husband, taking a handful of supplements.  Tired of diarrhea, and Pepto-Bismol not working.  May try Imodium.  Tums helps with heartburn.  Will vomit also, possibly to the detriment of absorbing vitamins, etc.   Reviewed GI symptoms and habits to problem-solve.  Takes Cymbalta at lunch, generally holds it down.  Enjoys her morning coffee, half-caf, with half & half, denies it irritates stomach.  Does have GERD, takes med for that.  Interpreted as possibly some degree of gastritis, possibly a sympathetic nervous system response.   Advised keep allowable, gentle foods like rice handy as a "base" for pills to "land".  Affirmed benefit of vitamin E, fish oil, and several other things she understand she is taking for resilience against COVID -- they likely help inflammation (pain, depression) as well.  Listening to positivity tapes son found for her. Enjoys them.  Attributes more stress to Meagan Moran -- he still tries to direct her to lie down, for one thing, but she is not as tired as she was, but she is tired of being directed.  Concerned for him and doing enough, still tempted to "chase" him to ask what she can do for him, but he does not seem to be getting very irritable about it.  Continued to advocate she be more willing  to let things lie, but sometimes she has left out things he wished she thought of.  Given double messages, encouraged her to ask him to be clearer which things he wants her to ask about, and which things he wants her to let be.  Meanwhile, good sign for depression that she has the energy to be irritated.  H will probably take it as a sign of strength if she talks back a little instead of approaching mousy.  Meagan Moran will most likely accompany her on Friday, but he wants her to record feedback from PCP after this visit, since he does not trust her memory and wants to know.  Says she and PCP can laugh together about it.  Suggested facetiously jokes she can make if she wants to help him ease up and see if he can notice that her cognitive "lights" are on better.  Affirmed it is OK if she wants to change the terms of rest periods, shorten or eliminate napping, as long as she does not run herself ragged trying to please.  Affirmed it's OK to care about his postop symptoms, too, which seem to include incontinence and a skin infection.  Therapeutic modalities: Cognitive Behavioral Therapy, Solution-Oriented/Positive Psychology and provocative  Mental Status/Observations:  Appearance:   Casual     Behavior:  Appropriate  Motor:  Normal  Speech/Language:   Clear and Coherent  Affect:  Appropriate and more range  Mood:  dysthymic and better energy  Thought process:  normal and  less sluggish  Thought content:    WNL and worries, less intense  Sensory/Perceptual disturbances:    WNL  Orientation:  Fully oriented  Attention:  Good  Concentration:  Fair  Memory:  limited working memory  Insight:    Fair  Judgment:   Good  Impulse Control:  Fair   Risk Assessment: Danger to Self: No Self-injurious Behavior: No Danger to Others: No Physical Aggression / Violence: No Duty to Warn: No Access to Firearms a concern: No  Assessment of progress:  progressing  Diagnosis:   ICD-10-CM   1. Major depressive  disorder, recurrent episode, moderate (HCC)  F33.1   2. Generalized anxiety disorder  F41.1   3. Insomnia due to other mental disorder  F51.05    F99    stabilizing  4. Relationship problem between partners  Z63.0   5. Type 2 diabetes mellitus with hyperosmolarity without coma, unspecified whether long term insulin use (HCC)  E11.00   6. Low back pain, unspecified back pain laterality, unspecified chronicity, unspecified whether sciatica present  M54.5   7. Caregiver stress  Z63.6    Plan:  . Clarify med supply and timing . Resolved to clarify which things to be asking about and which not . Resolved that she does not have to necessarily spend 4 hours of downtime if she has the energy and interest to do something, as long as it is something she wants to do, not a "have to" . Communication tips -- if objecting to directive, ask H if it's necessary; if want him to let up, ask how he would tell when it's no longer needed . Other recommendations/advice as may be noted above . Set up f/u with Ms. Montez Morita, clarify refill situation . Continue to utilize previously learned skills ad lib . Maintain medication as prescribed and work faithfully with relevant prescriber(s) if any changes are desired or seem indicated . Call the clinic on-call service, present to ER, or call 911 if any life-threatening psychiatric crisis . Return for session(s) already scheduled.Marland Kitchen  Next scheduled visit in this office 12/24/2019.  Robley Fries, PhD Marliss Czar, PhD LP Clinical Psychologist, [facility] [address], [address] [place], [place] [address] [phone]

## 2019-12-24 ENCOUNTER — Ambulatory Visit: Payer: BC Managed Care – PPO | Admitting: Psychiatry

## 2019-12-28 ENCOUNTER — Ambulatory Visit: Payer: BC Managed Care – PPO | Admitting: Psychiatry

## 2019-12-28 ENCOUNTER — Other Ambulatory Visit: Payer: Self-pay

## 2019-12-28 DIAGNOSIS — Z630 Problems in relationship with spouse or partner: Secondary | ICD-10-CM

## 2019-12-28 DIAGNOSIS — K580 Irritable bowel syndrome with diarrhea: Secondary | ICD-10-CM

## 2019-12-28 DIAGNOSIS — F331 Major depressive disorder, recurrent, moderate: Secondary | ICD-10-CM | POA: Diagnosis not present

## 2019-12-28 DIAGNOSIS — Z636 Dependent relative needing care at home: Secondary | ICD-10-CM

## 2019-12-28 DIAGNOSIS — F5105 Insomnia due to other mental disorder: Secondary | ICD-10-CM | POA: Diagnosis not present

## 2019-12-28 DIAGNOSIS — F99 Mental disorder, not otherwise specified: Secondary | ICD-10-CM

## 2019-12-28 DIAGNOSIS — F411 Generalized anxiety disorder: Secondary | ICD-10-CM

## 2019-12-28 DIAGNOSIS — E1100 Type 2 diabetes mellitus with hyperosmolarity without nonketotic hyperglycemic-hyperosmolar coma (NKHHC): Secondary | ICD-10-CM

## 2019-12-28 NOTE — Progress Notes (Signed)
Psychotherapy Progress Note [facility] [name], PhD LP  Patient ID: LEMON WHITACRE     MRN: [id number] Therapy format: Individual psychotherapy Date: 12/28/2019      Start: 8:19a     Stop: 9:09a     Time Spent: 50 min Location: In-person   Session narrative (presenting needs, interim history, self-report of stressors and symptoms, applications of prior therapy, status changes, and interventions made in session) Still diarrhea, vomiting, and [name] allegedly pushed her to get up this morning and make him a large salad for his dinner.  Last night says he took her glucometer b/c she was compulsively checking it.  This morning she started to sleep in but he got her up.  Puzzled why he slept with her last night, when he had prepared to sleep separately (reportedly to keep from bothering her with his frequent urination and incontinence s/p prostatectomy).  Reasoned in session that maybe he just wanted to be close to her, especially in light of recent conflict.  Says "he actually understood" the objection she made to having too many appointments to keep up with when we met together, and that she wanted chiropractor's adjustments, not his keto diet advice or slew of supplements, since she has diabetes, there is medical advice for that, and it's confusing.  Turned out they had a more open conversation on the way home Friday, where she could validate for him that she feels put down, often enough, by things he says and she really needs less direction on the whole, coming from too many people.  Says he did dishes last night, which was a pleasant surprise.   Affirmed and reinforced truth-telling about how she feels, seems to have helped bring down a cycle of negativity and anxiety between them.  Affirmed also that her husband responded when asked, hopefully encouraging her that she need not feel so helpless or worrisome, by taming his own direction-giving.  Suggested she thank him for the  patience he has shown teaching her things and going back over procedures, e.g., reported lessons in stock trading.  Structurally, there is still an issue of discrepant bedtimes -- hers typically more like 9p, his more like 1am -- but she feels it's not a big deal.  Re. her affected digestion, she's trying to eat more rice and yogurt to "bind" waste together better and ease up diarrhea.  Continues to use Pepto-Bismol, allegedly on dr's advice, but not nearly enough.  Advised to clarify directions if needed, ask if Imodium more appropriate to diarrhea, and practice anxiety control by more diligently taking slow, easygoing breaths, and using imagery of waves rolling up and crashing ("beach breathing").  Agrees, will try, takes notes.  Therapeutic modalities: Cognitive Behavioral Therapy and Solution-Oriented/Positive Psychology  Mental Status/Observations:  Appearance:   Casual     Behavior:  Appropriate  Motor:  Normal except pain-related slowing  Speech/Language:   Clear and Coherent  Affect:  Depressed and a little lighter  Mood:  anxious and depressed  Thought process:  slowed, cogent  Thought content:    WNL  Sensory/Perceptual disturbances:    WNL  Orientation:  Fully oriented  Attention:  Good  Concentration:  Fair  Memory:  grossly intact  Insight:    Good  Judgment:   Fair  Impulse Control:  Fair   Risk Assessment: Danger to Self: No Self-injurious Behavior: No Danger to Others: No Physical Aggression / Violence: No Duty to Warn: No Access to Firearms a concern: No  Assessment of progress:  progressing  Diagnosis:   ICD-10-CM   1. Major depressive disorder, recurrent episode, moderate (HCC)  F33.1   2. Generalized anxiety disorder  F41.1   3. Irritable bowel syndrome with diarrhea  K58.0   4. Insomnia due to other mental disorder  F51.05    F99   5. Type 2 diabetes mellitus with hyperosmolarity without coma, unspecified whether long term insulin use (HCC)  E11.00   6.  Relationship problem between partners  Z63.0   7. Caregiver stress  Z63.6    Plan:  . Nyra Capes for his listening and patience lately . "Beach breathingTeacher, music . Advise Imodium, if OK with doctor, to get the most out of her food and meds . Other recommendations/advice as may be noted above . Continue to utilize previously learned skills ad lib . Maintain medication as prescribed and work faithfully with relevant prescriber(s) if any changes are desired or seem indicated . Call the clinic on-call service, present to ER, or call 911 if any life-threatening psychiatric crisis . Return for session(s) already scheduled.Marland Kitchen  Next scheduled visit in this office 12/31/2019.  [name], PhD [name], PhD LP Clinical Psychologist, [facility] [address] [place], [place] [address] [phone]

## 2019-12-31 ENCOUNTER — Other Ambulatory Visit: Payer: Self-pay

## 2019-12-31 ENCOUNTER — Ambulatory Visit: Payer: BC Managed Care – PPO | Admitting: Psychiatry

## 2019-12-31 DIAGNOSIS — M545 Low back pain, unspecified: Secondary | ICD-10-CM

## 2019-12-31 DIAGNOSIS — E1100 Type 2 diabetes mellitus with hyperosmolarity without nonketotic hyperglycemic-hyperosmolar coma (NKHHC): Secondary | ICD-10-CM

## 2019-12-31 DIAGNOSIS — F331 Major depressive disorder, recurrent, moderate: Secondary | ICD-10-CM

## 2019-12-31 DIAGNOSIS — F5105 Insomnia due to other mental disorder: Secondary | ICD-10-CM | POA: Diagnosis not present

## 2019-12-31 DIAGNOSIS — K580 Irritable bowel syndrome with diarrhea: Secondary | ICD-10-CM | POA: Diagnosis not present

## 2019-12-31 DIAGNOSIS — F411 Generalized anxiety disorder: Secondary | ICD-10-CM

## 2019-12-31 DIAGNOSIS — Z630 Problems in relationship with spouse or partner: Secondary | ICD-10-CM

## 2019-12-31 DIAGNOSIS — F99 Mental disorder, not otherwise specified: Secondary | ICD-10-CM

## 2019-12-31 NOTE — Progress Notes (Signed)
Psychotherapy Progress Note [facility] Meagan Moore, PhD LP  Patient ID: Meagan Moran     MRN: [id number] Therapy format: Individual psychotherapy Date: 12/31/2019      Start: 10:14a     Stop: 11:04a     Time Spent: 50 min Location: In-person   Session narrative (presenting needs, interim history, self-report of stressors and symptoms, applications of prior therapy, status changes, and interventions made in session) After a warming time with Meagan Moran, says she pestered too much and he moved back out of the bedroom.  Downcast.  Knows it is an altered, needy state she gets in, and can get it that it's going to backfire but feels compulsive to ask him if he needs anything or what she can do for him, even when it's clear he is going to be irritated.  Diarrhea is under better control with Imodium, standard OTC instructions.   Feels some mental clearing happening as her digestion regulates.  Still vomits some, and finds herself reacting to things in the fridge she doesn't want.  Seems to be averse to meat, or at least bacon, of which there is a lot with Meagan Moran trying out a ketogenic diet.  Is trying to go with gentle foods herself (rice, Gatorade, broth).  Meagan Moran got her an antiemetic amulet, doesn't know if she believes in it, but complying.  Insulin is still under lock, monitored by Meagan Moran.  Continues to practice non-catastrophic reading of her blood sugars, following medical advice when not to panic-treat it with insulin (easier, obviously, without free access).    For soothing, and anxiety control, reinforced her understanding -- and framed it as a "note to Meagan Moran" -- that Meagan Moran does not fundamentally mean to be harsh; he's trying to calm his own nerves and do her a favor when he become directive with her, even caustic comments and snappy reactions he might make start from a place of concern for her, and it will become OK as she holds back from trying urgently to "fix" the moment  between them.    Reviewed belly breathing, which she says she is doing, when needed, with some benefit.  Taught "special circumstances" breathing technique as well (therapeutic cursing -- "Oh [in] .Marland Kitchen..shiiiiiiit... [out]") in case of need.    Admits she sometimes fears going home.  Denies any physical or implied threat there.  Took on double messages from Meagan Moran -- "Don't run yourself ragged" vs. "I need a wife you ought to be thinking of me".  Some anxiety about sexuality, but does not particularly feel coerced.  Overall, coached her in taking "not now" for a valid answer the first time she asks if she can do anything for him or spend time with him, reminding her Meagan Moran is a lot more of a [employer]SI[employer] man than that, so she can trust what he says without having test it, especially multiple times.  Agrees, will try.  Therapeutic modalities: Cognitive Behavioral Therapy and Solution-Oriented/Positive Psychology  Mental Status/Observations:  Appearance:   Casual     Behavior:  Appropriate and slowed  Motor:  slowed gait, apparent back pain  Speech/Language:   clear, slowed  Affect:  Depressed  Mood:  anxious and depressed  Thought process:  slowed, inhibited  Thought content:    WNL and worry  Sensory/Perceptual disturbances:    WNL  Orientation:  Fully oriented  Attention:  Fair  Concentration:  Fair  Memory:  grossly intact  Insight:    Fair  Judgment:  Fair  Impulse Control:  Fair   Risk Assessment: Danger to Self: No Self-injurious Behavior: No Danger to Others: No Physical Aggression / Violence: No Duty to Warn: No Access to Firearms a concern: No  Assessment of progress:  situational setback(s)  Diagnosis:   ICD-10-CM   1. Major depressive disorder, recurrent episode, moderate (HCC)  F33.1   2. Generalized anxiety disorder  F41.1   3. Irritable bowel syndrome with diarrhea  K58.0   4. Insomnia due to other mental disorder  F51.05    F99   5. Type 2 diabetes mellitus with  hyperosmolarity without coma, unspecified whether long term insulin use (HCC)  E11.00   6. Relationship problem between partners  Z63.0   7. Low back pain, unspecified back pain laterality, unspecified chronicity, unspecified whether sciatica present  M54.5    Plan:  . Give H latitude to be upset, refocus on asking things once, taking "not now" or being upset as a full answer, and if receives sarcastic or cutting remarks, try to let it pass and come to himself . Supportive self-talk -- approach uncomfortable feelings not as "It", which has been overly pejorative, but as "Meagan Moran", in need of the same kind of support se used to give actual children in her work . Apply breathing techniques for tension release, including therapeutic cursing version PRN . Other recommendations/advice as may be noted above . Continue to utilize previously learned skills ad lib . Maintain medication as prescribed and work faithfully with relevant prescriber(s) if any changes are desired or seem indicated . Call the clinic on-call service, present to ER, or call 911 if any life-threatening psychiatric crisis . Return for session(s) already scheduled.Marland Kitchen  Next scheduled visit in this office 01/04/2020.  Meagan Fries, PhD Meagan Czar, PhD LP Clinical Psychologist, [facility] [address] [place], [place] [address] [phone]

## 2020-01-04 ENCOUNTER — Other Ambulatory Visit: Payer: Self-pay

## 2020-01-04 ENCOUNTER — Ambulatory Visit: Payer: BC Managed Care – PPO | Admitting: Psychiatry

## 2020-01-04 DIAGNOSIS — K580 Irritable bowel syndrome with diarrhea: Secondary | ICD-10-CM | POA: Diagnosis not present

## 2020-01-04 DIAGNOSIS — F411 Generalized anxiety disorder: Secondary | ICD-10-CM | POA: Diagnosis not present

## 2020-01-04 DIAGNOSIS — F331 Major depressive disorder, recurrent, moderate: Secondary | ICD-10-CM | POA: Diagnosis not present

## 2020-01-04 DIAGNOSIS — Z630 Problems in relationship with spouse or partner: Secondary | ICD-10-CM

## 2020-01-04 DIAGNOSIS — F17210 Nicotine dependence, cigarettes, uncomplicated: Secondary | ICD-10-CM

## 2020-01-04 DIAGNOSIS — E1100 Type 2 diabetes mellitus with hyperosmolarity without nonketotic hyperglycemic-hyperosmolar coma (NKHHC): Secondary | ICD-10-CM | POA: Diagnosis not present

## 2020-01-04 NOTE — Progress Notes (Signed)
Psychotherapy Progress Note [facility] Marliss Czar, PhD LP  Patient ID: Meagan Moran     MRN: [id number] Therapy format: Individual psychotherapy Date: 01/04/2020      Start: 9:16a     Stop: 10:03a     Time Spent: 47 min Location: In-person   Session narrative (presenting needs, interim history, self-report of stressors and symptoms, applications of prior therapy, status changes, and interventions made in session) "He's still in the other room...which is OK."  Trying to stay low-key enough about it, let well enough alone.  This morning, Meagan Moran woke up (unusual), she greeted him, just gave her intentions to make her morning run for cash, coffee, and a walk, no pressure or pestering.  Has left her glucometer at home, and bummed a couple cigarettes while out this morning.  Says Meagan Moran does deserve a lot of credit for putting up with her, including differing over her smoking without making demands.    Still nauseous, diarrhea, sometime for smelling all the bacon Meagan Moran likes to eat these days on his keto (accurate?) diet.  Grosses her out.  Meagan Moran weighs her, is concerned about her losing weight and not getting enough to eat, though she is plainly still overweight vs. normal recommendations but understandably down from her old typical.  PT says she is doing OK, not conking out, having to sleep extra, dehydrating, or eating disordered.  Trying to practice the [facility] philosophy with Meagan Moran, not read in.  Trying also to practice trusting that she did things, like lock the door, since checking and asking have been irritating behaviors.  Still finds self chasing him sometimes, offering to help with things, which can risk annoying him.  Suggested rule of thumb -- if it feels urgent, don't, if it's just an offer, OK.  Is doing some diaphragmatic breathing to calm, to some good effect.  Antiemetic bracelet may help some.  Pepto-Bismol nearly every day.  Imodium still helpful for controlling  diarrhea.  Says Meagan Moran is accepting it when she says she's eaten what she can handle.  Question in to PCP about what next.  Seems like a scattershot approach to symptoms, so encouraged full disclosure, exact advice, and good notes.  Intentions today to do some useful activities at home -- some wash this morning ... "get as much done as possible without going totally crazy" ... rest, probably nap.  Afternoon, work together on taxes.  Consistent with the idea of one significant focus each of the three sections of the day.  Reaffirmed that caring accurately for her digestion is a priority because everything else depends on feeling well enough to activate, so caring for digestion is caring for everything else worth caring about -- energy, focus, anxiety, depression, diabetes, sleep, relationship...  Affirmed practical plans for the day, emphasis on reasonable expectations, moderation, catching urgency before it makes decisions like pestering, and letting Meagan Moran be Meagan Moran if anything presses tense.  Encouraged to stay off cigarettes for the benefit of digestion as much as anything.  Therapeutic modalities: Cognitive Behavioral Therapy and Solution-Oriented/Positive Psychology  Mental Status/Observations:  Appearance:   Casual     Behavior:  Appropriate  Motor:  slowed attrib to pain  Speech/Language:   Clear and Coherent and some hesitancy, vagueness  Affect:  Depressed  Mood:  depressed  Thought process:  normal and slowed  Thought content:    WNL and worry  Sensory/Perceptual disturbances:    WNL  Orientation:  Fully oriented  Attention:  Good  Concentration:  Fair  Memory:  grossly intact  Insight:    Good  Judgment:   Fair  Impulse Control:  Fair   Risk Assessment: Danger to Self: No Self-injurious Behavior: No Danger to Others: No Physical Aggression / Violence: No Duty to Warn: No Access to Firearms a concern: No  Assessment of progress:  stabilized  Diagnosis:   ICD-10-CM   1. Major  depressive disorder, recurrent episode, moderate (HCC)  F33.1   2. Generalized anxiety disorder  F41.1   3. Irritable bowel syndrome with diarrhea  K58.0   4. Type 2 diabetes mellitus with hyperosmolarity without coma, unspecified whether long term insulin use (HCC)  E11.00   5. Relationship problem between partners  Z63.0   6. Cigarette nicotine dependence, uncomplicated  D53.299    Plan:  . Small goals, one main one for each time period in the day, keep humane self-expectations . Continue letting [name] be [name] when it comes to irritation, and refrain from pestering . Recommend no more smoking . Get clear advice from PCP about combining OTC meds for diarrhea and digestive distress -- probably missing or misreading something . Other recommendations/advice as may be noted above . Continue to utilize previously learned skills ad lib . Maintain medication as prescribed and work faithfully with relevant prescriber(s) if any changes are desired or seem indicated . Call the clinic on-call service, present to ER, or call 911 if any life-threatening psychiatric crisis . Return for session(s) already scheduled.Marland Kitchen  Next scheduled visit in this office 01/07/2020.  [name], PhD [name], PhD LP Clinical Psychologist, [facility] [address] [place], [place] [address] [phone]

## 2020-01-07 ENCOUNTER — Ambulatory Visit (INDEPENDENT_AMBULATORY_CARE_PROVIDER_SITE_OTHER): Payer: BC Managed Care – PPO | Admitting: Psychiatry

## 2020-01-07 ENCOUNTER — Other Ambulatory Visit: Payer: Self-pay

## 2020-01-07 DIAGNOSIS — F411 Generalized anxiety disorder: Secondary | ICD-10-CM

## 2020-01-07 DIAGNOSIS — E1100 Type 2 diabetes mellitus with hyperosmolarity without nonketotic hyperglycemic-hyperosmolar coma (NKHHC): Secondary | ICD-10-CM | POA: Diagnosis not present

## 2020-01-07 DIAGNOSIS — K580 Irritable bowel syndrome with diarrhea: Secondary | ICD-10-CM

## 2020-01-07 DIAGNOSIS — Z630 Problems in relationship with spouse or partner: Secondary | ICD-10-CM

## 2020-01-07 DIAGNOSIS — Z9989 Dependence on other enabling machines and devices: Secondary | ICD-10-CM

## 2020-01-07 DIAGNOSIS — G4733 Obstructive sleep apnea (adult) (pediatric): Secondary | ICD-10-CM

## 2020-01-07 DIAGNOSIS — F331 Major depressive disorder, recurrent, moderate: Secondary | ICD-10-CM

## 2020-01-07 DIAGNOSIS — F17210 Nicotine dependence, cigarettes, uncomplicated: Secondary | ICD-10-CM

## 2020-01-07 NOTE — Progress Notes (Signed)
Psychotherapy Progress Note [facility] Marliss Czar, PhD LP  Patient ID: Meagan Moran     MRN: [id number] Therapy format: Individual psychotherapy Date: 01/07/2020      Start: 9:15a     Stop: 10:04a     Time Spent: 49 min Location: In-person   Session narrative (presenting needs, interim history, self-report of stressors and symptoms, applications of prior therapy, status changes, and interventions made in session) Still in separate bedrooms.  [name] coming in later this morning for his session.  PT continues to go out for coffee in the mornings.  "I still go around in circles" worrying about how [name] will react to certain things not being done.  Still diarrhea, under some more control with Imodium.  Still using antiemetic bracelet, but difficult keeping up with battery.  Will still worry about whether she is doing things, still go into the bathroom to (in hiding) play her phone dice game.  Confronted hiding what is supposedly "normal" and acceptable.    Says a couple times she figures [name] will "wonder why" (get irritated) that she didn't do wash before she left the house.  Roleplayed and analyzed the likely interaction, teaching her to verify that he got his question answered before moving on to subsidiary questions, to help prevent it feeling like a harangue and to help [name], if irritable, get what he sought in the first place rather than frustrate himself by going down the road with fault-finding.  Has almost finished the cigarettes she has.  Cont urging her to quit, at the end of the pack or before, since smoke and nicotine will both irritate her GI tract and contribute to her nausea complaint as well as other things.  Says [name] was musing aloud this morning about whether to "fire Ossiel Marchio" as his own therapist but allow PT to continue.  Agreed he is free to address that in its own time and space, with continued agreement free to be frank between her therapy time and his,  as both of their interests seem to be involved in both therapy settings.  Still important that if they have a relationship issue they should get together about it, and if they want help for it, they should co-schedule, not risk setting up a cycle where one or both want to complain about the other.  Given her concerns about husband's feelings toward her and tendency to obsess, encouraged her to tell the difference between when [name] is irritable and he wants to get away vs irritable and he wants to overmanage her; if away, then let [name] be [name]; if he wants to get more involved or question her uncomfortably, ask him what he needs to see from her to feel better about how/what she is doing in order to make short work of complaints, turn it constructive, and get out of the kind of interaction that makes her feel similar to when she was trapped with her critical mother as an adolescent.  Therapeutic modalities: Cognitive Behavioral Therapy, Solution-Oriented/Positive Psychology and Assertiveness/Communication  Mental Status/Observations:  Appearance:   Casual     Behavior:  tentative  Motor:  Normal  Speech/Language:   clear  Affect:  Flat  Mood:  anxious  Thought process:  concrete  Thought content:    worries  Sensory/Perceptual disturbances:    WNL  Orientation:  Fully oriented  Attention:  Good  Concentration:  Fair  Memory:  grossly intact  Insight:    Fair  Judgment:   Fair  Impulse Control:  Fair   Risk Assessment: Danger to Self: No Self-injurious Behavior: No Danger to Others: No Physical Aggression / Violence: No Duty to Warn: No Access to Firearms a concern: No  Assessment of progress:  stabilized  Diagnosis:   ICD-10-CM   1. Major depressive disorder, recurrent episode, moderate (HCC)  F33.1   2. Generalized anxiety disorder  F41.1   3. Irritable bowel syndrome with diarrhea  K58.0   4. Type 2 diabetes mellitus with hyperosmolarity without coma, unspecified whether long  term insulin use (HCC)  E11.00   5. Relationship problem between partners  Z63.0   6. Cigarette nicotine dependence, uncomplicated  N23.557   7. OSA on CPAP  G47.33    Z99.89    Plan:  . Try to stop hiding gaming -- if it's OK to do it, it's OK to show it . Under duress, ask [name] if she can show him what he needs to know about when she'll do something  . If [name] wants distance, let him cool, do not chase/pester . Continued recommendation to H to be intentional about his tone as PT is very susceptible to catastrophizing irritability in loved ones . Continue to advocate to PT to maintain healthy levels of sleep and activity . Take breathing breaks instead of smoke breaks, and quit . As needed, ask PCP about further treatment for IBS, which seems to be draining mental status  . Other recommendations/advice as may be noted above . Continue to utilize previously learned skills ad lib . Maintain medication as prescribed and work faithfully with relevant prescriber(s) if any changes are desired or seem indicated . Call the clinic on-call service, present to ER, or call 911 if any life-threatening psychiatric crisis . Return in about 1 week (around 01/14/2020) for session(s) already scheduled.Marland Kitchen  Next scheduled visit in this office 01/11/2020.  [name], PhD [name], PhD LP Clinical Psychologist, [facility] [address] [place], [place] [address] [phone]

## 2020-01-11 ENCOUNTER — Telehealth: Payer: Self-pay | Admitting: Psychiatry

## 2020-01-11 ENCOUNTER — Ambulatory Visit: Payer: BC Managed Care – PPO | Admitting: Psychiatry

## 2020-01-11 DIAGNOSIS — F331 Major depressive disorder, recurrent, moderate: Secondary | ICD-10-CM

## 2020-01-11 DIAGNOSIS — K580 Irritable bowel syndrome with diarrhea: Secondary | ICD-10-CM

## 2020-01-11 DIAGNOSIS — Z79899 Other long term (current) drug therapy: Secondary | ICD-10-CM

## 2020-01-11 DIAGNOSIS — R4182 Altered mental status, unspecified: Secondary | ICD-10-CM

## 2020-01-11 NOTE — Telephone Encounter (Signed)
Not sure if I worked this note system correctly, but I called Meagan Moran and made a phone note.  He would still like a callback from [employer]. Cymbalta.  See my note for detail and hypotheses.

## 2020-01-11 NOTE — Telephone Encounter (Signed)
RTC to [employer] 5:30pm -- PT has been losing cognitive organization -- running dishwasher not even close to full, only taking her medications when closely supervised, not noticing pills left in container, after she scoops them out, more dependent on directions, not initiating household tasks, seems to be failing to notice more and more things, great difficulty concentrating, paying any focused attention to things.  All worse than when consulted 4 days ago.  Notably, getting dehydrated from continued, persistent diarrhea and vomiting, which continue despite daily Pepto-Bismol and H's supervision of Imodium as of Feb 11.  (Reported Friday that she has been under-taking it, either lying or believing herself to have taken it when she did not.)  Since Friday, has been on supervised OTC max of 4 Imodium AD per day without control.    H has called GI specialist for advice, but reveals PT has not actually been seen by specialist yet, so it is doubtful GI can give any crisis guidance about her condition and would only refer back to primary care.  Meanwhile, H says he believes PT gets sufficiently rehydrated with water and low-sugar Gatorade, but really no way to tell absent a medical assessment whether she is actually dehydrated, or electrolytes are out of balance at this stage.  GI reactions have been going on daily for at least 2 months to this point, so both are quite possible.  Also conceivable by now that she has a bacterial imbalance, a malabsorption syndrome, or both, which may include underproduction of important neurotransmitters.  H asks if Cymbalta could be the culprit -- seems doubtful but defer that question to prescriber (Ms. Montez Morita).  It is clear enough that GI racing may mean she does not get much of the medication in her and that declining mental status is a matter of undertreated depression and other health factors including sleep, nutrition, malabsorption, etc.  H denies need for hospitalization -- no  safety issues, threats to self or others, bleeding, acute pain -- but he is at the point of mistrusting her behind the wheel and is about to restrict her driving.  Fransico Michael that GI regulation is a high priority, good possibility that sustained GI tract reactivity has created a malnutrition syndrome, malabsorption of helpful medication, and possibly bacterial imbalance.  No known use of antibiotics in November-December.  Offered they may see [facility] for consultation -- declined for now.  Advised reasonable possibility that PT could have a persistent GI tract infection (maybe not c. difficile, but something) that could be promptly assessed and tested at urgent care or ED.  Given that the specialist is not acquainted, then, if not judged urgent enough to see urgent care, strongly advised next step be to contact PCP, either tomorrow regular hours or on call service, to authorize a stronger antidiarrheal.    Meanwhile, continued emphasis on gentle diet, see about ways to include pro- and prebiotics in her intake (e.g., yogurt, psyllium/Metamucil if tolerated, fibrous vegetables like yams) and discontinue if they race her system or cause gas, cramps or bloating and get full-fledged medical advice re. Probiotics and dietary strategies that both respect diabetes and help address GI reactivity at the same time.  Robley Fries, PhD Marliss Czar, PhD LP Clinical Psychologist, [facility] [address], [address] [place], [place] [address] [phone]

## 2020-01-11 NOTE — Telephone Encounter (Signed)
Pt's husband [name] called and stated Mrs. Ciocca has gotten worse since she started the CYMBALTA. He states she is beginning to lose function and cannot even take her meds when they are laid right in front of her. He would like her medications reassessed. Mr. Tomczak is requesting a call back.

## 2020-01-11 NOTE — Progress Notes (Unsigned)
Psychotherapy Progress Note [facility] Marliss Czar, PhD LP  Patient ID: Meagan Moran     MRN: [id number] Therapy format: {Therapy Types:21967::"Individual psychotherapy"} Date: 01/11/2020      Start: ***:***     Stop: ***:***     Time Spent: *** min Location: {SvcLoc:22530::"In-person"}   Session narrative (presenting needs, interim history, self-report of stressors and symptoms, applications of prior therapy, status changes, and interventions made in session) ***  Therapeutic modalities: {AM:23362::"Cognitive Behavioral Therapy","Solution-Oriented/Positive Psychology"}  Mental Status/Observations:  Appearance:   {PSY:22683}     Behavior:  {PSY:21022743}  Motor:  {PSY:22302}  Speech/Language:   {PSY:22685}  Affect:  {PSY:22687}  Mood:  {PSY:31886}  Thought process:  {PSY:31888}  Thought content:    {PSY:[phone]}  Sensory/Perceptual disturbances:    {PSY:986-079-1245}  Orientation:  {Psych Orientation:23301::"Fully oriented"}  Attention:  {PSY:22877}  Concentration:  {PSY:715-139-0217}  Memory:  {[phone]}  Insight:    {PSY:715-139-0217}  Judgment:   {PSY:715-139-0217}  Impulse Control:  {PSY:715-139-0217}   Risk Assessment: Danger to Self: {Risk:22599::"No"} Self-injurious Behavior: {Risk:22599::"No"} Danger to Others: {Risk:22599::"No"} Physical Aggression / Violence: {Risk:22599::"No"} Duty to Warn: {AMYesNo:22526::"No"} Access to Firearms a concern: {AMYesNo:22526::"No"}  Assessment of progress:  {Progress:22147::"progressing"}  Diagnosis: No diagnosis found. Plan:  . *** . Other recommendations/advice as may be noted above . Continue to utilize previously learned skills ad lib . Maintain medication as prescribed and work faithfully with relevant prescriber(s) if any changes are desired or seem indicated . Call the clinic on-call service, present to ER, or call 911 if any life-threatening psychiatric crisis . No follow-ups on file.Marland Kitchen  Next  scheduled visit in this office 01/11/2020.  Robley Fries, PhD Marliss Czar, PhD LP Clinical Psychologist, [facility] [address], [address] [place], [place] [address] [phone]

## 2020-01-12 ENCOUNTER — Telehealth: Payer: Self-pay | Admitting: Psychiatry

## 2020-01-12 DIAGNOSIS — Z79899 Other long term (current) drug therapy: Secondary | ICD-10-CM

## 2020-01-12 DIAGNOSIS — R4182 Altered mental status, unspecified: Secondary | ICD-10-CM

## 2020-01-12 NOTE — Telephone Encounter (Signed)
Makes sense, and thanks for seeing and communicating that.  The picture has just been so murky for when she is and is not regular with meds, when she is and is not overtreating diabetes, and when her digestion is and is not regulating.  We keep finding out she's worse off than it seems.  Per last night's call, I urged [name] to be sure they get a clear, assertive strategy ASAP from her PCP about stopping this persistent diarrhea, making it clear that what looks like medication effects can easily be disordered digestion, too, spoiling what we would all hope medication does and stressing her brain on a physiological level, so if we have further contact, let's each make sure they keep both medical priorities in focus -- stopping the electrolyte leakage and measuring tp see how bad a deficit she is running.  I wouldn't count on Gatorade first aid to be necessarily taking care of it, so let's each be ready to ask detail questions about how they are self-treating the problem.  It can be equally difficult for her to be honest and forthcoming and for him to get the whole picture in view, and both can adjust medical advice on their own if they're afraid it's not trustworthy (more him), or it's too "weak" (her).

## 2020-01-12 NOTE — Addendum Note (Signed)
Addended by: Derenda Mis on: 01/12/2020 02:47 PM   Modules accepted: Orders

## 2020-01-12 NOTE — Telephone Encounter (Signed)
VM left stating would like to use [facility] for lab draw. Pt requesting a Seratonin Level checked also.

## 2020-01-14 ENCOUNTER — Ambulatory Visit: Payer: BC Managed Care – PPO | Admitting: Psychiatry

## 2020-01-14 ENCOUNTER — Encounter: Payer: Self-pay | Admitting: Psychiatry

## 2020-01-14 NOTE — Progress Notes (Signed)
Admin note for non-service contact  Patient ID: [name]  MRN: [id number] DATE: 01/14/2020  Professional courtesy for noshow this morning 9am.  PT has been in 2/wk service, but CA'd by H Tuesday die to physiological distress and altered mental status, with separately noted responses from myself and Ms. Montez Morita to questions and requests from [name] to straighten out perceived medication problems that seem to be much more about wear and tear from persistent diarrhea.  Courtesy call from nurse on [place] request, left message.  Staff (MJ) report contact with him re missed appointment, having courtesy-called to ask if she's OK.  Suggestion made that H was annoyed by the call, certain he had called yesterday with a message cancelling today due to expected road hazards.  Staff do not habitually document cancellation messages, so it is plausible that a cancellation message from yesterday was either inaudible, misrecorded, or the cancellation not fully executed.  No charge.   Reportedly PT is "fine" right now, no details shared with staff about getting antidiarrheal service or lab test, currently scheduled in 4 days.   Robley Fries, PhD Marliss Czar, PhD LP Clinical Psychologist, [facility] [address] [place], [place] [address] [phone]

## 2020-01-14 NOTE — Telephone Encounter (Signed)
Left message to follow up on patient's condition and if they were able to get her labs.

## 2020-01-18 ENCOUNTER — Other Ambulatory Visit: Payer: Self-pay

## 2020-01-18 ENCOUNTER — Ambulatory Visit (INDEPENDENT_AMBULATORY_CARE_PROVIDER_SITE_OTHER): Payer: BC Managed Care – PPO | Admitting: Psychiatry

## 2020-01-18 DIAGNOSIS — F331 Major depressive disorder, recurrent, moderate: Secondary | ICD-10-CM

## 2020-01-18 DIAGNOSIS — K580 Irritable bowel syndrome with diarrhea: Secondary | ICD-10-CM | POA: Diagnosis not present

## 2020-01-18 DIAGNOSIS — E1100 Type 2 diabetes mellitus with hyperosmolarity without nonketotic hyperglycemic-hyperosmolar coma (NKHHC): Secondary | ICD-10-CM

## 2020-01-18 DIAGNOSIS — F411 Generalized anxiety disorder: Secondary | ICD-10-CM | POA: Diagnosis not present

## 2020-01-18 DIAGNOSIS — F17210 Nicotine dependence, cigarettes, uncomplicated: Secondary | ICD-10-CM | POA: Diagnosis not present

## 2020-01-18 DIAGNOSIS — Z630 Problems in relationship with spouse or partner: Secondary | ICD-10-CM

## 2020-01-18 DIAGNOSIS — M545 Low back pain, unspecified: Secondary | ICD-10-CM

## 2020-01-18 NOTE — Progress Notes (Signed)
Psychotherapy Progress Note [facility] Marliss Czar, PhD LP  Patient ID: Meagan Moran     MRN: [id number] Therapy format: Individual psychotherapy Date: 01/18/2020      Start: 9:10a     Stop: 10:10a     Time Spent: 60 min Location: In-person   Session narrative (presenting needs, interim history, self-report of stressors and symptoms, applications of prior therapy, status changes, and interventions made in session) Husband moved out of the bedroom again yesterday.  Two video visits yesterday, tiring.  Only 4 hours sleep last night, but clearly more alert today.  Spent hours in [employer] before falling asleep.  Startled by this morning's alarm.  Much to tell.  Admits she hasn't been honest with health care or with husband.  On seeing PCP recently, admitted she goes to the bathroom to get away from H's irritability.  Also acknowledged she dislikes the smell of the bacon he cooks often, makes her nauseous.  In response to these says [name] got pissed off and moved out of the bedroom again.  On incurring his anger, began vomiting again.  Processed this, addressing her fear that her husband just dislikes her now.  Further temptations to follow him "like a puppy dog" and pester, asking if he needs anything.  Reminded she is several times more likely to annoy him this way than to win favor, and that he has been clear enough that he wants solitude when he wants it, even if he's expressed anger before it.  Interpreted that, even if her revelations were the jumping-off point for him getting angry, he probably does not particularly think she was criticizing or considering it any kind of personal attack.  Clear enough [name]'s anger is his first reaction to finding out things aren't working as expected, or to finding out things have been hidden from him.  (For one, he thought she was spending long times in the bathroom for toileting, but he has on a couple of occasions now learned she goes  in for quiet and plays a phone game.)  Had a quiet dinner with husband last night, says he seems de-escalated.  Nevertheless, says H is talking again about "firing" TX, alleging TX should have known more was wrong with her, known she wasn't telling the truth, and should have ordered tests (confusing with the practice of medicine?) or been informing psychiatry very actively about all the concerns he has had (concerns which, objectively, have just been brought well to light in the past week, by him being in touch personally, and concerns which TX and RX have pursued actively through phone contacts in that time).  Admits this morning she felt "screw it," ate Cheerios even if they would raise her blood sugar and went out and bought a pack of cigarettes.  Had a couple, hid a couple in the bathroom, threw the rest out.  Encouraged to dispose of the ones in the bathroom, flushing them if she would rather, but just leaving them in the trash can would be fine -- if obvious that she got them, also obvious she was disposing of them, and further practice in frankness & honesty, rather than seeming to hide yet another thing.  Concurred with PCP advice that b.s. below 200 is not a concern right now.  Re. IBD, her diarrhea was stopped effectively once she went on full-dose Imodium AD for about 3-4 days (was at max dose at last contact with [name], 2/16).  Reports control over diarrhea for  4-5 days now, in fact getting stopped up.  Drinking a lot of water, urinating plenty, and off Imodium now that she's been more stopped up.  Encouraged just to make sure she is clear about medical advice for IBS, timely in consulting PCP, and regardless of whether she or husband control the medication, be clear to dial it up or down when truly needed until balance is achieved.    PCP is doing blood work (CMP) on Thursday.  Confronted that it really should have been pursued last week, during the crisis, and no matter what theories or ideas come  up about testing serotonin or seeing alternative medicine, checking electrolytes should have been a firm priority given the symptoms [name] was himself so alarmed about at the time; had sodium been far enough off, it could've been deadly.  Reviewed sxs of serotonin syndrome ([employer] website), which do include chills, diarrhea, muscle weakness, and confusion.  Discussed the idea [name] has pressed about serotonin testing -- it is not customary, it may be subject to fairly rapid changes, and if serotonin syndrome is suspected, it is typically diagnosed clinically, by the symptoms, not by having a test.  Insurance also unlikely to cover it, but certainly free to go to the trouble and expense if desired.  PT says she does want it, is willing to pay out of pocket, if RX will order it, and asks that [place] communicate it for her.  Agreed, and encouraged PT to make sure she shortens the chain of communication between herself and husband and medical providers by dealing directly wherever possible.   Re. Meds -- have cut Trileptal in half as of last night.  Not sure if she has Cymbalta, but EHR shows it just Rx'd for 90 days a month ago.  Amlodipine has been raised for BP, may be helping some with anxiety as a side benefit, but clearly stopping diarrhea and getting some broth have helped her with mental status, and she is alert for having broached -- and endured -- conflict with husband over points of her self-care.  Encouraged her to continue with solid foods wherever she can, accept PCP word that b.s. under 200 is not an actual alarm, try to prioritize digestion-friendly foods that contain less carbs where possible.  Otherwise, ditch the remaining cigarettes, identify and call in what her current dose and supply of Cymbalta are, and schedule with Ms. Eulas Post when able, [employer] along to shorten the chain of communication.  Therapeutic modalities: Cognitive Behavioral Therapy and Solution-Oriented/Positive Psychology  Mental  Status/Observations:  Appearance:   Casual     Behavior:  Appropriate  Motor:  Normal and except back pain  Speech/Language:   Clear and Coherent  Affect:  Appropriate and better eye contact, breakthrough tears when asked if she is afraid H dislikes her now  Mood:  anxious, depressed and less vegetative  Thought process:  more goal-directed, not slipping or stopping on words  Thought content:    WNL, worrisome  Sensory/Perceptual disturbances:    WNL  Orientation:  grossly intact  Attention:  Good  Concentration:  Good  Memory:  grossly intact  Insight:    Good  Judgment:   Fair  Impulse Control:  Fair   Risk Assessment: Danger to Self: No Self-injurious Behavior: No Danger to Others: No Physical Aggression / Violence: No Duty to Warn: No Access to Firearms a concern: No  Assessment of progress:  stabilized  Diagnosis:   ICD-10-CM   1. Generalized anxiety disorder  F41.1   2. Major depressive disorder, recurrent episode, moderate (HCC)  F33.1   3. Irritable bowel syndrome with diarrhea  K58.0   4. Cigarette nicotine dependence, uncomplicated  F17.210   5. Relationship problem between partners  Z63.0   6. Type 2 diabetes mellitus with hyperosmolarity without coma, unspecified whether long term insulin use (HCC)  E11.00   7. Low back pain, unspecified back pain laterality, unspecified chronicity, unspecified whether sciatica present  M54.5    Plan:  . Follow PCP advice -- solid foods, trust b.s. under 200, prioritize digestion-friendly foods that contain less carbs where possible . Follow through on CMP and sodium level, though electrolytes are probably restored now.  If it happens again, do not hesitate to check medical advice and electrolyte testing. Tamala Ser remaining cigarettes . Identify and call in what her current dose and supply of Cymbalta are . Schedule with Ms. Montez Morita when able, [employer] along to shorten the chain of communication . [place] to contact Ms. Montez Morita as  courtesy to ask for serotonin test authorization -- if complications, communicate directly, include husband if involved . Other recommendations/advice as may be noted above . Continue to utilize previously learned skills ad lib . Maintain medication as prescribed and work faithfully with relevant prescriber(s) if any changes are desired or seem indicated . Call the clinic on-call service, present to ER, or call 911 if any life-threatening psychiatric crisis . Return for session(s) already scheduled.Marland Kitchen  Next scheduled visit in this office 01/21/2020.  Robley Fries, PhD Marliss Czar, PhD LP Clinical Psychologist, [facility] [address] [place], [place] [address] [phone]

## 2020-01-21 ENCOUNTER — Other Ambulatory Visit: Payer: Self-pay

## 2020-01-21 ENCOUNTER — Ambulatory Visit (INDEPENDENT_AMBULATORY_CARE_PROVIDER_SITE_OTHER): Payer: BC Managed Care – PPO | Admitting: Psychiatry

## 2020-01-21 DIAGNOSIS — F411 Generalized anxiety disorder: Secondary | ICD-10-CM

## 2020-01-21 DIAGNOSIS — E1100 Type 2 diabetes mellitus with hyperosmolarity without nonketotic hyperglycemic-hyperosmolar coma (NKHHC): Secondary | ICD-10-CM

## 2020-01-21 DIAGNOSIS — Z630 Problems in relationship with spouse or partner: Secondary | ICD-10-CM | POA: Diagnosis not present

## 2020-01-21 DIAGNOSIS — F5105 Insomnia due to other mental disorder: Secondary | ICD-10-CM

## 2020-01-21 DIAGNOSIS — F331 Major depressive disorder, recurrent, moderate: Secondary | ICD-10-CM | POA: Diagnosis not present

## 2020-01-21 DIAGNOSIS — F99 Mental disorder, not otherwise specified: Secondary | ICD-10-CM

## 2020-01-21 DIAGNOSIS — M545 Low back pain, unspecified: Secondary | ICD-10-CM

## 2020-01-21 DIAGNOSIS — F17210 Nicotine dependence, cigarettes, uncomplicated: Secondary | ICD-10-CM

## 2020-01-21 NOTE — Progress Notes (Signed)
Psychotherapy Progress Note [facility] [name], PhD LP  Patient ID: Meagan Moran     MRN: [id number] Therapy format: Individual psychotherapy Date: 01/21/2020      Start: 9:15a     Stop: 10:05a     Time Spent: 50 min Location: In-person   Session narrative (presenting needs, interim history, self-report of stressors and symptoms, applications of prior therapy, status changes, and interventions made in session) [place] a week ago, as noted separately, turned out to be cancelled by H due to altered mental status and physical distress, followup call to husband to offer help straightening out conflicting ideas of medication problems and potential electrolyte imbalance from persistent diarrhea.   Last seen 3 days ago, where she confessed not having been honest with health care personnel or with husband.  Says H and kids now want to do a "limited POA" about her health and health care, which sounds like it actually means a form of guardianship.  Says she has reduced frequency of scheduled therapy to 1/wk, reversing what she and her husband both said was crucial to address her needs before she confessed lying about her status.  Today says H wants her to record session on her phone.  Allowed it, in light of the flareup of trust issues at home.  Explained that guardianship is a formal legal process that is more tedious than it seems, requiring a court appearance and presenting evidence to a judge that a person is incompetent to make even ill-advised decisions for herself.  Much more likely the talk about it is about the need to vent anger, much more than a serious attempt to remove her rights, and the process would be as apt to test her husband's patience than anything she has been doing.  Affirmed that all the advice so far to be frank about her activities and her wishes was meant to prevent heating up this kind of mistrust at home, and she really has to end all forms of dishonesty to  restore the support she wants, even if it means differing with [name] and risking perceived backlash.  For all the talk, he still seems to have her best interest in mind, even if he can go down primrose paths about what is medically important.  Good news that pain is somewhat better.  Good time yesterday with [name] at [facility].  Is getting some help from [facility] at Dr. Alinda Money office (chiropractic) re unspecified wellness advice.  Says no vomiting this week, everything's staying down.  Two vomits under heavy stress/anxiety, attributed to worry.  Has scheduled with Ms. Eulas Post for a joint visit with H.  Confirms she is actually on 60mg  Cymbalta.  Blood draw yesterday for sodium level, anticipate it will be largely normal, since she has been eating solid food more at this point, getting soup, and has undoubtedly restored sodium.  Still feels 8 or 9 out of 10 anxiety, largely about the prospect of having her rights curtailed by family.  Encouraged her strongly to define terms, get specifics wherever possible about what is expected of her, and ask how and when she is pleasing, succeeding, showing ability to manage, or otherwise reducing the fear that she will self-destruct rather than let the conversation stay in a negative cycle of critical reactions and either explanations that may be taken for lies, fudging about her behavior in a way that confirms she lies, or overly submissive/groveling that becomes annoying for its emotionality.  Granted it means being  more self-possessed at a time when she is made more anxious, but focusing on expectations, desires, and how to tell if she is succeeding at restoring trust stand the best chance of reducing anxiety-provoking interpersonal experiences at home.  Therapeutic modalities: Cognitive Behavioral Therapy, Solution-Oriented/Positive Psychology, Ego-Supportive and Assertiveness/Communication  Mental Status/Observations:  Appearance:   Casual     Behavior:   Appropriate  Motor:  Normal  Speech/Language:   Clear and Coherent  Affect:  Appropriate  Mood:  anxious and depressed  Thought process:  concrete  Thought content:    worries  Sensory/Perceptual disturbances:    WNL  Orientation:  Fully oriented  Attention:  Good  Concentration:  Fair  Memory:  grossly intact  Insight:    Good  Judgment:   Fair  Impulse Control:  Fair   Risk Assessment: Danger to Self: No Self-injurious Behavior: No Danger to Others: No Physical Aggression / Violence: No Duty to Warn: No Access to Firearms a concern: No  Assessment of progress:  progressing  Diagnosis:   ICD-10-CM   1. Generalized anxiety disorder  F41.1   2. Major depressive disorder, recurrent episode, moderate (HCC)  F33.1   3. Relationship problem between partners  Z63.0   4. Low back pain, unspecified back pain laterality, unspecified chronicity, unspecified whether sciatica present  M54.5   5. Type 2 diabetes mellitus with hyperosmolarity without coma, unspecified whether long term insulin use (HCC)  E11.00   6. Cigarette nicotine dependence, uncomplicated  F17.210   7. Insomnia due to other mental disorder  F51.05    F99    Plan:  . Communication tips above for dealing with skepticism and anger at home and working constructively with family members.  Main principles, be radically honest even if fear backlash or disapproval and ask what works for others rather than try to cover or explain. . Other recommendations/advice as may be noted above . Continue to utilize previously learned skills ad lib . Maintain medication as prescribed and work faithfully with relevant prescriber(s) if any changes are desired or seem indicated . Call the clinic on-call service, present to ER, or call 911 if any life-threatening psychiatric crisis . Return in about 1 week (around 01/28/2020).Marland Kitchen  Next scheduled visit in this office 01/28/2020.  Robley Fries, PhD Marliss Czar, PhD LP Clinical Psychologist,  [facility] [address] [place], [place] [address] [phone]

## 2020-01-25 ENCOUNTER — Ambulatory Visit: Payer: BC Managed Care – PPO | Admitting: Internal Medicine

## 2020-01-28 ENCOUNTER — Other Ambulatory Visit: Payer: Self-pay

## 2020-01-28 ENCOUNTER — Ambulatory Visit (INDEPENDENT_AMBULATORY_CARE_PROVIDER_SITE_OTHER): Payer: BC Managed Care – PPO | Admitting: Psychiatry

## 2020-01-28 DIAGNOSIS — M545 Low back pain, unspecified: Secondary | ICD-10-CM

## 2020-01-28 DIAGNOSIS — Z630 Problems in relationship with spouse or partner: Secondary | ICD-10-CM

## 2020-01-28 DIAGNOSIS — F331 Major depressive disorder, recurrent, moderate: Secondary | ICD-10-CM | POA: Diagnosis not present

## 2020-01-28 DIAGNOSIS — F17210 Nicotine dependence, cigarettes, uncomplicated: Secondary | ICD-10-CM

## 2020-01-28 DIAGNOSIS — F411 Generalized anxiety disorder: Secondary | ICD-10-CM | POA: Diagnosis not present

## 2020-01-28 DIAGNOSIS — Z9989 Dependence on other enabling machines and devices: Secondary | ICD-10-CM

## 2020-01-28 DIAGNOSIS — E1100 Type 2 diabetes mellitus with hyperosmolarity without nonketotic hyperglycemic-hyperosmolar coma (NKHHC): Secondary | ICD-10-CM

## 2020-01-28 DIAGNOSIS — F99 Mental disorder, not otherwise specified: Secondary | ICD-10-CM

## 2020-01-28 DIAGNOSIS — G4733 Obstructive sleep apnea (adult) (pediatric): Secondary | ICD-10-CM

## 2020-01-28 DIAGNOSIS — F5105 Insomnia due to other mental disorder: Secondary | ICD-10-CM

## 2020-01-28 NOTE — Progress Notes (Signed)
Psychotherapy Progress Note [facility] Marliss Czar, PhD LP  Patient ID: YSIDRA SOPHER     MRN: [id number] Therapy format: Individual psychotherapy Date: 01/28/2020      Start: 9:18a     Stop: 10:07a     Time Spent: 49 min Location: In-person   Session narrative (presenting needs, interim history, self-report of stressors and symptoms, applications of prior therapy, status changes, and interventions made in session) Continues with weekly visits, down from previously insisted 2/wk.  Confused again on appt time today, came at 8 instead of 9, essentially trying to be sure she didn't mess up getting here.    Discussed policy on blood sugar, knows she is not supposed to worry about it below 200 but no idea if it's above 200.  Clearly foggier today mentally.  Admits bad night's sleep after "being up and down bugging [name]" checking to see if she had things right.  Reminded her that he's made it clear lots of questions about whether she's doing things to his satisfaction are inherently annoying and provoke the very reaction she is trying to protect against.  Says tense time yesterday being questioned at the chiropractor's office whether she smoked.    Is getting her appetite back, more or less suddenly feeling hungry all the time.  Not afraid of gaining weight, honestly does not know if she is losing or holding steady, but compared to her former obesity she has repeatedly said H is worried about her losing weight.  (Current appearance is not underweight, just baggy clothing for having lost from her previous weight.)  [name] makes nutritional shakes for her per chiropractor's advice.  She will sip on Gatorade, even though diarrhea is now controlled.  Does not feel like anything is off in what she eats at this point.  Still sensitive to bacon smell from [name]'s keto cooking, makes her nauseous.  Smell of dirt is also provocative, gags her for feeling gritty in her throat.  Smell of bleach  very provocative as well.  Encouraged that she mention it to PCP about chemical sensitivities, as it may relate to vitamin/mineral deficiencies or other physiological factors that could be remedied.  Realizes she was worrying about their friend Bud last night (dying of cancer, in aggressive treatment, can't see him) amid the other concerns about winning [name]'s favor.  A friend made through [name] a couple years ago.  He's funny, has appreciated his humor, used to cook together, he would bring beer, had good times hanging out.  In some respects, a brother to her.  (Compared to her own brother, who is estranged, with hx of drugs and vandalism of the family home when she was in school.  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  She was asked to leave the school 2nd year when she reported pot use among the girls under her charge and the administrator did not want to know.  She caught buses across country back to [place], her GM not well.)  Support/empathy provided.   Says she is still going in the bathroom to hide playing her phone game.  Encouraged again to bring it out in the open, because "hiding" anything is hypnotic, making it all seem more likely that she is still under the critical watch of her mother, and being discovered naturally arouses reactions from her husband that get closer to what used to be both fearsome and inevitable in her home growing  up.  Be open and honest, break the cycle, both psychologically and interactively.  Agrees, will try.  Therapeutic modalities: Cognitive Behavioral Therapy, Solution-Oriented/Positive Psychology, Insight-Oriented and Assertiveness/Communication  Mental Status/Observations:  Appearance:   Casual     Behavior:  Appropriate, shaken/spacey at first  Motor:  Normal  Speech/Language:   normal fluency  Affect:  Appropriate and better energy  Mood:  anxious and depressed  Thought process:  initially blocking,  clearer with time  Thought content:    worries  Sensory/Perceptual disturbances:    WNL  Orientation:  Soft orientation to time of day/appt  Attention:  difficulty with alertness earlier on, cleared some during the session  Concentration:  Fair  Memory:  grossly intact  Insight:    Fair  Judgment:   Fair  Impulse Control:  Fair   Risk Assessment: Danger to Self: No Self-injurious Behavior: No Danger to Others: No Physical Aggression / Violence: No Duty to Warn: No Access to Firearms a concern: No  Assessment of progress:  variable  Diagnosis:   ICD-10-CM   1. Generalized anxiety disorder  F41.1   2. Major depressive disorder, recurrent episode, moderate (HCC)  F33.1   3. Relationship problem between partners  Z63.0   4. Low back pain, unspecified back pain laterality, unspecified chronicity, unspecified whether sciatica present  M54.5   5. Type 2 diabetes mellitus with hyperosmolarity without coma, unspecified whether long term insulin use (HCC)  E11.00   6. Cigarette nicotine dependence, uncomplicated  Z61.096   7. Insomnia due to other mental disorder  F51.05    F99   8. OSA on CPAP  G47.33    Z99.89    Plan:  . Renew efforts to be frank and honest about how she spends her time -- trust has been tenuous and she needs to show she is reliable, not necessarily obedient . Mention multiple chemical sensitivities to PCP, as it may help assess vitamin/mineral or other physiological issues that can be treated more effectively . Abstain from cigarettes  . Gauge hunger -- if carb cravings, see about feeding with noncarb calories (e.g., healthy oils) where possible, provided it does not conflict with diabetic medical advice  . Other recommendations/advice as may be noted above . Continue to utilize previously learned skills ad lib . Maintain medication as prescribed and work faithfully with relevant prescriber(s) if any changes are desired or seem indicated . Call the clinic on-call  service, present to ER, or call 911 if any life-threatening psychiatric crisis . Return in about 1 week (around 02/04/2020).Marland Kitchen  Next scheduled visit in this office 02/03/2020.  [name], PhD [name], PhD LP Clinical Psychologist, [facility] [address], [place], [place] [address] [phone]

## 2020-01-29 ENCOUNTER — Other Ambulatory Visit: Payer: Self-pay | Admitting: Psychiatry

## 2020-01-29 DIAGNOSIS — F39 Unspecified mood [affective] disorder: Secondary | ICD-10-CM

## 2020-01-29 DIAGNOSIS — F411 Generalized anxiety disorder: Secondary | ICD-10-CM

## 2020-02-01 ENCOUNTER — Ambulatory Visit: Payer: BC Managed Care – PPO | Admitting: Psychiatry

## 2020-02-01 LAB — BASIC METABOLIC PANEL
BUN/Creatinine Ratio: 14 (ref 12–28)
BUN: 9 mg/dL (ref 8–27)
CO2: 25 mmol/L (ref 20–29)
Calcium: 10.2 mg/dL (ref 8.7–10.3)
Chloride: 96 mmol/L (ref 96–106)
Creatinine, Ser: 0.65 mg/dL (ref 0.57–1.00)
GFR calc Af Amer: 109 mL/min/{1.73_m2} (ref >59)
GFR calc non Af Amer: 95 mL/min/{1.73_m2} (ref >59)
Glucose: 202 mg/dL — ABNORMAL HIGH (ref 65–99)
Potassium: 4.3 mmol/L (ref 3.5–5.2)
Sodium: 137 mmol/L (ref 134–144)

## 2020-02-03 ENCOUNTER — Other Ambulatory Visit: Payer: Self-pay

## 2020-02-03 ENCOUNTER — Ambulatory Visit (INDEPENDENT_AMBULATORY_CARE_PROVIDER_SITE_OTHER): Payer: BC Managed Care – PPO | Admitting: Psychiatry

## 2020-02-03 ENCOUNTER — Encounter: Payer: Self-pay | Admitting: Psychiatry

## 2020-02-03 DIAGNOSIS — F39 Unspecified mood [affective] disorder: Secondary | ICD-10-CM | POA: Diagnosis not present

## 2020-02-03 DIAGNOSIS — F5105 Insomnia due to other mental disorder: Secondary | ICD-10-CM

## 2020-02-03 DIAGNOSIS — F99 Mental disorder, not otherwise specified: Secondary | ICD-10-CM

## 2020-02-03 DIAGNOSIS — F411 Generalized anxiety disorder: Secondary | ICD-10-CM | POA: Diagnosis not present

## 2020-02-03 MED ORDER — DULOXETINE HCL 30 MG PO CPEP: ORAL_CAPSULE | ORAL | 0 refills | Status: DC

## 2020-02-03 MED ORDER — OXCARBAZEPINE 300 MG PO TABS: 300.0000 mg | ORAL_TABLET | Freq: Every day | ORAL | 0 refills | Status: DC

## 2020-02-03 MED ORDER — BUSPIRONE HCL 30 MG PO TABS: 30.0000 mg | ORAL_TABLET | Freq: Two times a day (BID) | ORAL | 0 refills | Status: DC

## 2020-02-03 MED ORDER — VORTIOXETINE HBR 5 MG PO TABS: ORAL_TABLET | ORAL | 0 refills | Status: DC

## 2020-02-03 NOTE — Progress Notes (Signed)
[name] [id number] 01-13-1956 64 y.o.  Subjective:   Patient ID:  Meagan Moran is a 64 y.o. (DOB [birth year]) female.  Chief Complaint:  Chief Complaint  Patient presents with  . Anxiety  . Depression  . Other    Cognitive issues    HPI Meagan Moran presents to the office today for follow-up of depression and anxiety. She is accompanied by her husband. She reports that she is feeling fatigued most of the time. She reports, "I get very confused." They reports that she is having difficulty completing tasks, such as taking medications and following directions. Husband reports that she is having difficulty remembering how to do familiar tasks. He reports that she is no longer able to cook or follow a list. Husband reports that she can perform some tasks with his direction.  Husband reports that these changes started in early November when he was dx'd with cancer. They report that the difficulty with tasks is worse in the afternoon and the evening. Husband reports that it got worse and has since stabilized. She reports that she feels easily overwhelmed and has high anxiety. She reports that she has some physical s/s of anxiety in her stomach and chest. Anxiety is relieved with some meditation exercises. She reports frequent worry and will worry about various things. Mood has been more depressed. She reports some internal frustration.   She reports poor sleep and has difficulty staying asleep. Multiple awakenings. They estimate her sleeping 4-5 hours in a 24-hour period. Husband reports that she is taking frequent naps and spending 10-12 hours in the bed. She reports that her motivation to do things is there, but is unable to complete tasks. They report that she is enjoying some tasks when husband is able to provide step by step prompts. She reports that she is somewhat distracted. Has lost about 100 lbs in the last year. Pt reports that she is "hungry all the time" and husband reports that  her food intake is minimal and about 1200 calories.  Denies SI.   She reports that she is occasionally missing doses of medications or taking them later. Husband reports that he assists her with evening medication but that she is missing morning meds about 2 times a week.   Rarely taking Xanax prn and Norco prn  Past Psychiatric Medication Trials: Cymbalta- Effective for anxiety at 60 mg po qd.  Prozac- side effects.  Wellbutrin- side effects Buspar Trileptal- Effective. Caused nightmares. Was taking 300 mg po BID. Xanax    Review of Systems:  Review of Systems  Gastrointestinal: Negative for diarrhea and nausea.  Musculoskeletal: Positive for back pain. Negative for gait problem.  Neurological: Positive for weakness. Negative for tremors.  Psychiatric/Behavioral:       Please refer to HPI    Medications: I have reviewed the patient's current medications.  Current Outpatient Medications  Medication Sig Dispense Refill  . Acetylcysteine (NAC 600 PO) Take by mouth.    Marland Kitchen amLODipine (NORVASC) 5 MG tablet TAKE 1 TABLET EVERY DAY AT LUNCH    . busPIRone (BUSPAR) 30 MG tablet Take 1 tablet (30 mg total) by mouth 2 (two) times daily. 180 tablet 0  . DULoxetine (CYMBALTA) 30 MG capsule Take 1 capsule daily for 10-14 days, then stop 14 capsule 0  . GLUTATHIONE PO Take by mouth.    Marland Kitchen HYDROcodone-acetaminophen (NORCO) 7.5-325 MG tablet     . losartan (COZAAR) 100 MG tablet Take 100 mg by mouth daily.    Marland Kitchen  metFORMIN (GLUCOPHAGE) 500 MG tablet Take 1 tablet (500 mg total) by mouth daily with breakfast. 90 tablet 3  . Omega-3 Fatty Acids (FISH OIL PO) Take by mouth.    . Oxcarbazepine (TRILEPTAL) 300 MG tablet Take 1 tablet (300 mg total) by mouth at bedtime. 90 tablet 0  . pravastatin (PRAVACHOL) 20 MG tablet Take 20 mg by mouth daily.    . Probiotic Product (PROBIOTIC PO) Take by mouth.    Marland Kitchen tiZANidine (ZANAFLEX) 4 MG tablet TAKE 1 TABLET 4 TIMES A DAY AS NEEDED FOR MUSCLE SPASMS    .  VITAMIN D PO Take by mouth.    . ALPRAZolam (XANAX) 0.5 MG tablet Take 1 tablet (0.5 mg total) by mouth 3 (three) times daily as needed for anxiety. 90 tablet 1  . Continuous Blood Gluc Receiver (FREESTYLE LIBRE 2 READER SYSTM) DEVI 1 each by Does not apply route once for 1 dose. 1 each 0  . Continuous Blood Gluc Sensor (FREESTYLE LIBRE 2 SENSOR SYSTM) MISC 1 each by Does not apply route every 14 (fourteen) days. 6 each 3  . glucagon (GLUCAGON EMERGENCY) 1 MG injection Inject 1 mg into the muscle once as needed for up to 1 dose. 1 each 12  . hydrochlorothiazide (MICROZIDE) 12.5 MG capsule Take 12.5 mg by mouth daily as needed.    . vortioxetine HBr (TRINTELLIX) 5 MG TABS tablet Take 1 tablet (5 mg total) by mouth daily for 7 days, THEN 2 tablets (10 mg total) daily for 28 days. 30 tablet 0   No current facility-administered medications for this visit.    Medication Side Effects: None  Allergies:  Allergies  Allergen Reactions  . Nsaids   . Sulfa Antibiotics     Past Medical History:  Diagnosis Date  . Back pain   . Diabetes mellitus, type II (HCC)   . Heart murmur   . Sleep apnea     Family History  Problem Relation Age of Onset  . Anxiety disorder Mother   . Anxiety disorder Maternal Grandmother   . Anxiety disorder Daughter     Social History   Socioeconomic History  . Marital status: Married    Spouse name: Not on file  . Number of children: Not on file  . Years of education: Not on file  . Highest education level: Not on file  Occupational History  . Not on file  Tobacco Use  . Smoking status: Current Some Day Smoker    Packs/day: 0.05    Types: Cigarettes  . Smokeless tobacco: Never Used  . Tobacco comment: Peaked at 1 ppd in 2019, on/off for years  Substance and Sexual Activity  . Alcohol use: Not Currently    Comment: "only on the holidays"  . Drug use: Not Currently  . Sexual activity: Not on file  Other Topics Concern  . Not on file  Social History  Narrative  . Not on file   Social Determinants of Health   Financial Resource Strain:   . Difficulty of Paying Living Expenses:   Food Insecurity:   . Worried About Programme researcher, broadcasting/film/video in the Last Year:   . Barista in the Last Year:   Transportation Needs:   . Freight forwarder (Medical):   Marland Kitchen Lack of Transportation (Non-Medical):   Physical Activity:   . Days of Exercise per Week:   . Minutes of Exercise per Session:   Stress:   . Feeling of Stress :  Social Connections:   . Frequency of Communication with Friends and Family:   . Frequency of Social Gatherings with Friends and Family:   . Attends Religious Services:   . Active Member of Clubs or Organizations:   . Attends Banker Meetings:   Marland Kitchen Marital Status:   Intimate Partner Violence:   . Fear of Current or Ex-Partner:   . Emotionally Abused:   Marland Kitchen Physically Abused:   . Sexually Abused:     Past Medical History, Surgical history, Social history, and Family history were reviewed and updated as appropriate.   Please see review of systems for further details on the patient's review from today.   Objective:   Physical Exam:  BP 102/64   Pulse 79   Physical Exam Constitutional:      General: She is not in acute distress. Musculoskeletal:        General: No deformity.  Neurological:     Mental Status: She is alert and oriented to person, place, and time.     Coordination: Coordination normal.  Psychiatric:        Attention and Perception: Attention and perception normal. She does not perceive auditory or visual hallucinations.        Mood and Affect: Mood is anxious. Mood is not depressed. Affect is not labile, blunt, angry or inappropriate.        Speech: Speech normal.        Behavior: Behavior normal. Behavior is cooperative.        Thought Content: Thought content normal. Thought content is not paranoid or delusional. Thought content does not include homicidal or suicidal ideation.  Thought content does not include homicidal or suicidal plan.        Cognition and Memory: Cognition normal. She exhibits impaired recent memory.        Judgment: Judgment normal.     Comments: Insight intact     Lab Review:     Component Value Date/Time   NA 137 01/31/2020 1128   K 4.3 01/31/2020 1128   CL 96 01/31/2020 1128   CO2 25 01/31/2020 1128   GLUCOSE 202 (H) 01/31/2020 1128   BUN 9 01/31/2020 1128   CREATININE 0.65 01/31/2020 1128   CALCIUM 10.2 01/31/2020 1128   GFRNONAA 95 01/31/2020 1128   GFRAA 109 01/31/2020 1128    No results found for: WBC, RBC, HGB, HCT, PLT, MCV, MCH, MCHC, RDW, LYMPHSABS, MONOABS, EOSABS, BASOSABS  No results found for: POCLITH, LITHIUM   No results found for: PHENYTOIN, PHENOBARB, VALPROATE, CBMZ   .res Assessment: Plan:   Discussed several possible treatment options since both patient and her husband agree that there has been minimal improvement in mood and anxiety signs and symptoms with Cymbalta.  Discussed potential benefits, risks, and side effects of other treatment options to include Trintellix.  Patient agrees to trial of Trintellix.  Discussed cross titration to minimize risks of discontinuation signs and symptoms and side effects. Decrease Cymbalta to 30 mg daily for 10 to 14 days, then stop. Start Trintellix 5 mg daily for 1 week with a meal, then increase to 10 mg daily with a meal. Will continue Trileptal 300 mg at bedtime for anxiety and mood. Patient to follow-up in 4 to 6 weeks or sooner if clinically indicated. Patient advised to contact office with any questions, adverse effects, or acute worsening in signs and symptoms.  Kimani was seen today for anxiety, depression and other.  Diagnoses and all orders for this visit:  Generalized anxiety disorder -     DULoxetine (CYMBALTA) 30 MG capsule; Take 1 capsule daily for 10-14 days, then stop -     busPIRone (BUSPAR) 30 MG tablet; Take 1 tablet (30 mg total) by mouth 2 (two)  times daily. -     Oxcarbazepine (TRILEPTAL) 300 MG tablet; Take 1 tablet (300 mg total) by mouth at bedtime. -     vortioxetine HBr (TRINTELLIX) 5 MG TABS tablet; Take 1 tablet (5 mg total) by mouth daily for 7 days, THEN 2 tablets (10 mg total) daily for 28 days.  Episodic mood disorder (HCC) -     Oxcarbazepine (TRILEPTAL) 300 MG tablet; Take 1 tablet (300 mg total) by mouth at bedtime. -     vortioxetine HBr (TRINTELLIX) 5 MG TABS tablet; Take 1 tablet (5 mg total) by mouth daily for 7 days, THEN 2 tablets (10 mg total) daily for 28 days.  Insomnia due to other mental disorder -     Oxcarbazepine (TRILEPTAL) 300 MG tablet; Take 1 tablet (300 mg total) by mouth at bedtime.     Please see After Visit Summary for patient specific instructions.  Future Appointments  Date Time Provider [place]  02/11/2020  9:00 AM [name], PhD [facility] None  02/18/2020  9:00 AM [name], PhD [facility] None  03/09/2020  9:00 AM Philemon Kingdom, MD [facility] None  03/09/2020  4:00 PM Thayer Headings, PMHNP [facility] None    No orders of the defined types were placed in this encounter.   -------------------------------

## 2020-02-03 NOTE — Progress Notes (Signed)
Psychotherapy Progress Note [facility] Marliss Czar, PhD LP  Patient ID: Meagan Moran     MRN: [id number] Therapy format: Individual psychotherapy Date: 02/04/2020      Start: 8:15a     Stop: 9:02a     Time Spent: 47 min Location: In-person   Session narrative (presenting needs, interim history, self-report of stressors and symptoms, applications of prior therapy, status changes, and interventions made in session) Med check yesterday.  Per EHR, illuminated more severe sleep deprivation than previously known (est 4-5 hrs a night despite 10-12 hrs in bed; hx noted of sleep apnea dx and CPAP therapy).  Antidepressant change initiated from Cymbalta to Trintellix, presumably for better antianxiety effect.    PT fell working in the yard -- says yesterday, but apparently it was actually a few days ago (soft on time estimation) -- injured arm, in a makeshift sling (ace bandage) this morning, denies it's broken, has not sought medical attention.  Taking muscle relaxant (leftovers?), does not feel it needs medical attention but says she will go if it gets worse.  Anxiety and cognitive problems have remained more intense.  Reports IBS-D still under control.  Not probed further.  Confirmed sleep has been short, like 4-5 hrs, then says "only" since hurting her arm; however, she also characterizes falling asleep 3-4 am after spending hours worrying about cooking and cleaning, a focus of worry she has reported through the last few months, at least by day, particularly whether she will be able to do her duty as a wife.  This is consistent with phrasing her husband has used to complain about her condition, e.g., when asked what's going wrong by his observation, says, "I don't have a wife." and other generalizations.  Consistent with prior encouragement to let [name] be colorful without taking it to heart, says she has had some success trying to turn off her thoughts, but most often just gets  tired enough to sleep, then may get back up, sometimes at 4 or 5 (i.e., just an hour later) to get a snack.  CPAP and presumed OSA were mentioned as far back as September; asked after current practice with CPAP, admits when pressed not using it "sometimes", which seems to mean often.  Confronted with how her cognitive symptoms have been going on for a lot longer than just the past week's pain and injury, admits it could be that she has been getting short sleep for much longer and that we have not truly dealt with it.  (Per September note, CPAP was said to have been used for 6-7 years but acknowledged may be in poor condition, both for seal and cleanliness.  At the time, she was claiming to use Benadryl by day for supposed allergies).    Says her serotonin test results came back.  Can't remember what it said, but presumed normal, as her acute symptoms last month and she reportedly gave it to Ms. Carter yesterday.    Bothered by losses -- uncle died this past week, as reported by a cousin, and friend Bud continues to decline.  Meanwhile, reports that a decision was made at yesterday's med check for her to change therapists, and that Ms. Montez Morita is working on finding another one here who can do more for her. Pt notes it's a very awkward subject, but assured she has no obligation to take care of TX's imagined feelings, and it's certainly her own prerogative to make a change.  Solicited feedback on service thus  far and any unmet wishes for what therapy would accomplish or how it would work -- for her own part, hard to find vocabulary, but uses the word "wishy-washy" and reports both that she could have used more "direction" and that TX was "too strong" at moments.  Unable to give examples, and several suggestions made that she is voicing her husband's objections, including that he thinks TX is "arrogant".  Solicited constructive feedback on services, which largely boils down to trying to be more concise, given her  ailing memory, and more directive, which we agreed to mean more concrete and behavioral.  Assured further that there is no harm done in giving feedback, that adjusting expectations together is often the most potent and important thing in psychotherapy, and if any of it can be made clearer in the future with examples, they would be entirely welcome.  Also made clear that Jillyn HiddenGary remains welcome to communicate for himself, noting that he had had an appointment this afternoon, made under ongoing 3-way agreement to serve them both separately and together at their discretion, but that appointment is apparently cancelled, with implication that he is terminating.  Discussed outlook for therapy service, which PT chose to record on her phone, ostensibly for memory's sake.  Informed that she certainly may transfer to another therapist, that two in this office have specialty techniques which may benefit her, and that it probably takes about a month, give or take a couple weeks, to find time in their schedules.  Made clear that I do not know what plans or activities are under way to engage them, but she -- and her husband, provided her consent -- may of course inquire about them.  Told that I will go ahead and give my blessing to transfer on request, and provided another therapist accepts, she can get on their calendar when available.  At her discretion, as a patient possessing her own rights, she may also either continue service with me until then or discontinue and wait.  She elected to continue with weekly service as scheduled (the next two Fridays 3/19 and 3/26) but cancel Tuesday 3/30.  (That date apparently remained from a time not long ago when PT, said to have been directed by her husband, scheduled twice weekly for about 3 months, in hopes of making rapid progress.  About a month ago, PT and/or H thinned the schedule back to 1/wk, ostensibly to reduce her burden of appointment-keeping and presumably to either begin  terminating or at least to reallocate her time and expense back to their chiropractor's office and a wellness coach there.)  Understood that PT and H will mange schedule and engagement with another therapist on their own terms.  PT further assured that it is not an insult or bad behavior on her part to express feelings and ask for change; in fact, it is one of the most important capabilities TX has been hoping to raise in asking her to follow a fully honest, out-in-the-open approach to how she spends her time at home (i.e., not "hiding" in the bathroom to play her dice game but doing recreation out in the open, and being forthcoming if pain, sickness, fatigue, overwhelmed, or any other feelings mean she disagrees with advice or directives at home.    Obtained firsthand word later with Ms. Montez Moritaarter, which differed significantly from PT's account.  According to her, briefly mentioned wanting to change therapists at the beginning of yesterday's med check, and, after going over time tending to psychiatric  business, [name] pressed the issue again at the end of the session by asking her for recommendations who else he and PT could see.  Ms. Eulas Post explained therapist change policy in this office requiring either direct contact or the consent of both terminating and accepting therapists before scheduling, and referred PT and H back to [place] to speak directly about their wishes.  Also noted during med check that Majorie admitted having lied about persistent vomiting, that supposedly it really wasn't happening that way.  Given that she has come across in [place] sessions sometimes as both sick to her stomach and minimizing it, it is not clear whether she has been lying to inflate her symptoms (escape perceived responsibility, curry sympathy), lying now to either create more appearance of capability (e.g., to prevent having guardianship petitioned on her by family, as noted recently), or to pacify her husband (e.g., if she has in fact  had symptoms but believes he will only settle for a "confession"), or possibly both at different times.  At this point, it seems murky which to believe with this PT.    It has taken a long time to manifest and clarify, and discovery has often been driven by husband's fairly sudden complaints and challenges to TX's ability to read the patient, but it appears that PT is strongly geared to manage impressions with important others, a "survival skill" learned in childhood and reinforced through other episodes in her life such as the college story shared last week.  Impression now is that, under duress, she is apt to both overreport and underreport symptoms depending on how she thinks they will play with others.  While there does not seem to be any form of abuse going on, the pattern resembles a coercive relationship in that she is persistently preoccupied with what her husband thinks and how he will react, she processes and comments on whether he is rooming with her or not as approval, disapproval, and an indicator of whether she will be abandoned.  Transferred from her family of origin or created by their dynamics, it has been clear, and interpreted to both her and her husband, separately and together, that when things are presented in anger, including sarcasm and global criticisms, they will tend to trigger her mother transference, at least, and she is that much more apt to become anxious and either dissemble or disintegrate to cope.  Effectively, approaching her irritated has a lot of power to create the very behavior that she and her husband both complain of.    Objectively, she has herself magnified this, too, by lying, overpromising, trying to deny cognitive issues, and being inconsistent in many things.  While I have understood her self-directive to "shut up and just work harder" to have played a significant role in establishing multiple health problems, including smoking and some self-neglect, she has also  set herself up to sacrifice honesty by trying to manage others' impressions and her own momentary feelings instead of better master self-care skills.  In the time known, she has overmanaged blood sugar (both up and down), been sneaking cigarettes, hiding in the bathroom to play a phone game, among other things.  For the couple to have not pursued timely medical attention or blood testing when low sodium was strongly suspected last month was alarming in that the neglect could have been deadly had it escalated to the point of an arrhythmia.  Observation last fall of their conflict over who is "the alpha" (their term), and various undocumented  reports of either "making do", being indifferent to reported small injuries or illness, self-treating inconsistent with typical medical advice, pursuing unlikely theories of illness (e.g., Cymbalta overdose or serotonin syndrome instead of diarrhea-related dehydration or low sodium), persistently sharing all the same doctors (including previous therapist, Arbutus Ped), and pursuing multiple services while asking different practitioners to evaluate the other's judgment (husband's requests, as encountered so far) all suggest a strong, enduring, control/countercontrol dynamic between the two.    Taken together, the risk is that the couple are apt to jointly exercise inconsistent, emotionally-driven judgment about health care, alongside driving each other to repeatedly reenact the fears each learned with their own mothers (she to fear judgment, he to fear emotional engulfment).  Disabling illnesses (his fibromyalgia/chronic fatigue and now prostate cancer and recent prostatectomy, and her back pain, depression, and inconsistently managed diabetes and sleep apnea) continue to provide persistent reasons to feel more urgent for themselves and each other and to jump to conclusions about what should/must be mentioned, considered, or done for themselves and each other.  Meanwhile,  many inconsistencies can be created by their differently expressed trust issues, motivating each of them to "shade" what they tell health care (urgency/nonurgency, underreporting/exaggerating symptoms) without clear indication when, and to comment on each other in discrediting ways to shared providers, and sometimes to angle for their own preferred answers to health concerns more than to consider advice openly with freedom to accept or reject.  Most prominently in recent times, what I have seen is hedging self-report (more so her) and suddenly insisting on near-clairvoyant accuracy and near-military conviction about what this PT is up to and should be doing (more so him).  All of the above seem to have effectively triangled TX and made it harder for the individuals here to step back and recognize emotional "noise" and dysfunctional process going on that affect both self-care and health-care decision-making.  Therapeutic modalities: Cognitive Behavioral Therapy and Solution-Oriented/Positive Psychology  Mental Status/Observations:  Appearance:   Casual and typically baggy clothing (appropriate to previous weight)     Behavior:  uneasy, subdued  Motor:  stiff, attrib to back pain  Speech/Language:   forthcoming but slowed, prone to vagueness and some word-finding issues  Affect:  Constricted  Mood:  anxious  Thought process:  slowed, some interruptions  Thought content:    WNL  Sensory/Perceptual disturbances:    none evidenced  Orientation:  grossly intact  Attention:  Good  Concentration:  Fair  Memory:  suspected subject to distortion  Insight:    Fair  Judgment:   Fair  Impulse Control:  Fair   Risk Assessment: Danger to Self: No Self-injurious Behavior: No Danger to Others: No Physical Aggression / Violence: No Duty to Warn: No Access to Firearms a concern: No  Assessment of progress:  situational setback(s)  Diagnosis:   ICD-10-CM   1. Generalized anxiety disorder  F41.1   2.  Current severe episode of major depressive disorder without psychotic features, unspecified whether recurrent (HCC)  F32.2   3. Insomnia due to other mental disorder  F51.05    F99   4. OSA on CPAP  G47.33    Z99.89   5. Irritable bowel syndrome with diarrhea  K58.0    stabilized   6. Cigarette nicotine dependence, uncomplicated  F17.210   7. Type 2 diabetes mellitus with hyperosmolarity without coma, unspecified whether long term insulin use (HCC)  E11.00   8. Relationship problem between partners  Z63.0   9. Low back  pain, unspecified back pain laterality, unspecified chronicity, unspecified whether sciatica present  M54.5    Plan:  . Make sure breathing therapy, lighting, and bedroom comfort are all conducive to sleep readiness, and restrain exposure to unfiltered electronic screens at least 1 hour before intended sleep . Focal homework, written down by request on AVS: for anxiety control practice, please...  Find three times a day to use the breathing technique instead of phone game (OK to play after -- for this time focus on bodily relaxing)  Write down what results you get -- for yourself, whether you report them to me, somebody else, or nobody  Consciously relaxing with your own body, your own nervous system, instead of running to a distraction, will help recondition your nervous system to be ready for everything else you want it to do for you . Other recommendations/advice as may be noted above . Continue to utilize previously learned skills ad lib . Maintain medication as prescribed and work faithfully with relevant prescriber(s) if any changes are desired or seem indicated . Call the clinic on-call service, present to ER, or call 911 if any life-threatening psychiatric crisis . Return in about 1 week (around 02/11/2020) for session(s) already scheduled.Marland Kitchen  Next scheduled visit in this office 02/11/2020.  Robley Fries, PhD Marliss Czar, PhD LP Clinical Psychologist, [facility] [address], [address] [place], [place] [address] [phone]

## 2020-02-03 NOTE — Patient Instructions (Signed)
Start Trintellix 5 mg daily with a meal for one week, then increase to 10 mg daily with a meal.  Decrease Cymbalta (Duloxetine) to 30 mg daily for 10 days (while starting Trintellix), then stop.  Continue Buspar and Oxcarbazepine without changes.

## 2020-02-04 ENCOUNTER — Ambulatory Visit (INDEPENDENT_AMBULATORY_CARE_PROVIDER_SITE_OTHER): Payer: BC Managed Care – PPO | Admitting: Psychiatry

## 2020-02-04 DIAGNOSIS — F322 Major depressive disorder, single episode, severe without psychotic features: Secondary | ICD-10-CM | POA: Diagnosis not present

## 2020-02-04 DIAGNOSIS — F411 Generalized anxiety disorder: Secondary | ICD-10-CM | POA: Diagnosis not present

## 2020-02-04 DIAGNOSIS — F99 Mental disorder, not otherwise specified: Secondary | ICD-10-CM

## 2020-02-04 DIAGNOSIS — M545 Low back pain, unspecified: Secondary | ICD-10-CM

## 2020-02-04 DIAGNOSIS — Z630 Problems in relationship with spouse or partner: Secondary | ICD-10-CM

## 2020-02-04 DIAGNOSIS — Z9989 Dependence on other enabling machines and devices: Secondary | ICD-10-CM

## 2020-02-04 DIAGNOSIS — F17210 Nicotine dependence, cigarettes, uncomplicated: Secondary | ICD-10-CM

## 2020-02-04 DIAGNOSIS — F5105 Insomnia due to other mental disorder: Secondary | ICD-10-CM | POA: Diagnosis not present

## 2020-02-04 DIAGNOSIS — K580 Irritable bowel syndrome with diarrhea: Secondary | ICD-10-CM

## 2020-02-04 DIAGNOSIS — E1100 Type 2 diabetes mellitus with hyperosmolarity without nonketotic hyperglycemic-hyperosmolar coma (NKHHC): Secondary | ICD-10-CM

## 2020-02-04 DIAGNOSIS — G4733 Obstructive sleep apnea (adult) (pediatric): Secondary | ICD-10-CM

## 2020-02-04 NOTE — Patient Instructions (Addendum)
For anxiety control practice, please   Find three times a day to use the breathing technique instead of phone game (OK to play after -- for this time focus on bodily relaxing)  Write down what results you get -- for yourself, whether you report them to me, somebody else, or nobody  Consciously relaxing with your own body, your own nervous system, instead of running to a distraction, will help recondition your nervous system to e ready for everything else you want it to do for you

## 2020-02-07 ENCOUNTER — Ambulatory Visit: Payer: BC Managed Care – PPO | Admitting: Psychiatry

## 2020-02-11 ENCOUNTER — Ambulatory Visit: Payer: BC Managed Care – PPO | Admitting: Psychiatry

## 2020-02-15 ENCOUNTER — Ambulatory Visit: Payer: BC Managed Care – PPO | Admitting: Psychiatry

## 2020-02-18 ENCOUNTER — Ambulatory Visit: Payer: BC Managed Care – PPO | Admitting: Psychiatry

## 2020-02-22 ENCOUNTER — Ambulatory Visit: Payer: BC Managed Care – PPO | Admitting: Psychiatry

## 2020-03-09 ENCOUNTER — Ambulatory Visit: Payer: BC Managed Care – PPO | Admitting: Internal Medicine

## 2020-03-09 ENCOUNTER — Other Ambulatory Visit: Payer: Self-pay

## 2020-03-09 ENCOUNTER — Encounter: Payer: Self-pay | Admitting: Psychiatry

## 2020-03-09 ENCOUNTER — Ambulatory Visit (INDEPENDENT_AMBULATORY_CARE_PROVIDER_SITE_OTHER): Payer: BC Managed Care – PPO | Admitting: Psychiatry

## 2020-03-09 VITALS — Wt 168.0 lb

## 2020-03-09 DIAGNOSIS — F39 Unspecified mood [affective] disorder: Secondary | ICD-10-CM

## 2020-03-09 DIAGNOSIS — F411 Generalized anxiety disorder: Secondary | ICD-10-CM

## 2020-03-09 NOTE — Progress Notes (Signed)
[name] [id number] 11/08/1956 64 y.o.  Subjective:   Patient ID:  Meagan Moran is a 64 y.o. (DOB 08/07/1956) female.  Chief Complaint:  Chief Complaint  Patient presents with  . Paranoid  . Anxiety  . Depression    HPI Meagan Moran presents to the office today for follow-up of anxiety and depression.  She is accompanied by her husband. Pt has said she is "slightly better" on Trintellix in terms of anxiety. She reports frequent worry and reports that anxiety is elevated. She reports that she feels like she wants to leave exam and go home. She reports that she feels irritated and that she thinks she has been more irritable on Trintellix. She reports frequent crying and tearfulness due to irritability and sadness. They report that her sleep depends on physical activity. If she has not been active, she has frequent awakenings due to anxiety.Taking an hour to fall asleep. They both report that she has some compulsive behaviors and will become fixated on things. She reports that she is frequently hungry. Husband reports that she is eating well, although sometimes she will eat only a portion of her food. Energy is low due to interrupted sleep. Husband reports that her motivation has been good. Husband reports that her concentration is poor and that her attention span is shortened. Denies SI.   Husband reports that she had period of time when she thought she was unable to walk and needed a cane although husband reports that she was walking without difficulty.   She reports occ paranoia related to her husband. She reports that this is causing distress. Denies AH or VH.   They report that her anxiety remains elevated.   Has been taking Xanax prn about once a day.   Pt is now seeing Leane Platt, [facility] for therapy. Has been using sound waves and biofeedback to help with anxiety. They plan to start Brain mapping.    Past Psychiatric Medication Trials: Cymbalta- Effective for  anxiety at 60 mg po qd.  Prozac- side effects.  Trintellix Wellbutrin- side effects Buspar Trileptal- Effective. Caused nightmares. Was taking 300 mg po BID. Xanax  Review of Systems:  Review of Systems  Endocrine:       She reports frequent shifts in body temperature.  Musculoskeletal: Negative for gait problem.  Skin:       Itching  Neurological: Negative for tremors.  Psychiatric/Behavioral:       Please refer to HPI    Medications: I have reviewed the patient's current medications.  Current Outpatient Medications  Medication Sig Dispense Refill  . Acetylcysteine (NAC 600 PO) Take by mouth.    . ALPRAZolam (XANAX) 0.5 MG tablet Take 1 tablet (0.5 mg total) by mouth 3 (three) times daily as needed for anxiety. 90 tablet 1  . amLODipine (NORVASC) 5 MG tablet TAKE 1 TABLET EVERY DAY AT LUNCH    . busPIRone (BUSPAR) 30 MG tablet Take 1 tablet (30 mg total) by mouth 2 (two) times daily. 180 tablet 0  . HYDROcodone-acetaminophen (NORCO) 7.5-325 MG tablet     . losartan (COZAAR) 100 MG tablet Take 100 mg by mouth daily.    . metFORMIN (GLUCOPHAGE) 500 MG tablet Take 1 tablet (500 mg total) by mouth daily with breakfast. 90 tablet 3  . Oxcarbazepine (TRILEPTAL) 300 MG tablet Take 1 tablet (300 mg total) by mouth at bedtime. 90 tablet 0  . pravastatin (PRAVACHOL) 20 MG tablet Take 20 mg by mouth daily.    Marland Kitchen  Probiotic Product (PROBIOTIC PO) Take by mouth.    Marland Kitchen tiZANidine (ZANAFLEX) 4 MG tablet TAKE 1 TABLET 4 TIMES A DAY AS NEEDED FOR MUSCLE SPASMS    . VITAMIN D PO Take by mouth.    . Continuous Blood Gluc Receiver (FREESTYLE LIBRE 2 READER SYSTM) DEVI 1 each by Does not apply route once for 1 dose. 1 each 0  . Continuous Blood Gluc Sensor (FREESTYLE LIBRE 2 SENSOR SYSTM) MISC 1 each by Does not apply route every 14 (fourteen) days. 6 each 3  . glucagon (GLUCAGON EMERGENCY) 1 MG injection Inject 1 mg into the muscle once as needed for up to 1 dose. 1 each 12  . GLUTATHIONE PO Take  by mouth.    . hydrochlorothiazide (MICROZIDE) 12.5 MG capsule Take 12.5 mg by mouth daily as needed.     No current facility-administered medications for this visit.    Medication Side Effects: Other: Possible paranoia  Allergies:  Allergies  Allergen Reactions  . Nsaids   . Sulfa Antibiotics     Past Medical History:  Diagnosis Date  . Back pain   . Diabetes mellitus, type II (HCC)   . Heart murmur   . Sleep apnea     Family History  Problem Relation Age of Onset  . Anxiety disorder Mother   . Anxiety disorder Maternal Grandmother   . Anxiety disorder Daughter     Social History   Socioeconomic History  . Marital status: Married    Spouse name: Not on file  . Number of children: Not on file  . Years of education: Not on file  . Highest education level: Not on file  Occupational History  . Not on file  Tobacco Use  . Smoking status: Current Some Day Smoker    Packs/day: 0.05    Types: Cigarettes  . Smokeless tobacco: Never Used  . Tobacco comment: Peaked at 1 ppd in 2019, on/off for years  Substance and Sexual Activity  . Alcohol use: Not Currently    Comment: "only on the holidays"  . Drug use: Not Currently  . Sexual activity: Not on file  Other Topics Concern  . Not on file  Social History Narrative  . Not on file   Social Determinants of Health   Financial Resource Strain:   . Difficulty of Paying Living Expenses:   Food Insecurity:   . Worried About Programme researcher, broadcasting/film/video in the Last Year:   . Barista in the Last Year:   Transportation Needs:   . Freight forwarder (Medical):   Marland Kitchen Lack of Transportation (Non-Medical):   Physical Activity:   . Days of Exercise per Week:   . Minutes of Exercise per Session:   Stress:   . Feeling of Stress :   Social Connections:   . Frequency of Communication with Friends and Family:   . Frequency of Social Gatherings with Friends and Family:   . Attends Religious Services:   . Active Member of  Clubs or Organizations:   . Attends Banker Meetings:   Marland Kitchen Marital Status:   Intimate Partner Violence:   . Fear of Current or Ex-Partner:   . Emotionally Abused:   Marland Kitchen Physically Abused:   . Sexually Abused:     Past Medical History, Surgical history, Social history, and Family history were reviewed and updated as appropriate.   Please see review of systems for further details on the patient's review from today.   Objective:  Physical Exam:  Wt 168 lb (76.2 kg)   BMI 22.78 kg/m   Physical Exam Constitutional:      General: She is not in acute distress. Musculoskeletal:        General: No deformity.  Neurological:     Mental Status: She is alert and oriented to person, place, and time.     Coordination: Coordination normal.  Psychiatric:        Attention and Perception: Perception normal. She is inattentive. She does not perceive auditory or visual hallucinations.        Mood and Affect: Mood is anxious. Mood is not depressed. Affect is not labile, blunt, angry or inappropriate.        Speech: Speech normal.        Behavior: Behavior is agitated and hyperactive.        Thought Content: Thought content is paranoid. Thought content is not delusional. Thought content does not include homicidal or suicidal ideation. Thought content does not include homicidal or suicidal plan.        Cognition and Memory: Cognition normal. She exhibits impaired recent memory.        Judgment: Judgment is impulsive and inappropriate.     Comments: Insight fair Patient reports that she would like to go home shortly after exam began. Patient later stands up and says that she needs to go for a walk and abruptly leaves exam and then returns after several minutes.     Lab Review:     Component Value Date/Time   NA 137 01/31/2020 1128   K 4.3 01/31/2020 1128   CL 96 01/31/2020 1128   CO2 25 01/31/2020 1128   GLUCOSE 202 (H) 01/31/2020 1128   BUN 9 01/31/2020 1128   CREATININE 0.65  01/31/2020 1128   CALCIUM 10.2 01/31/2020 1128   GFRNONAA 95 01/31/2020 1128   GFRAA 109 01/31/2020 1128    No results found for: WBC, RBC, HGB, HCT, PLT, MCV, MCH, MCHC, RDW, LYMPHSABS, MONOABS, EOSABS, BASOSABS  No results found for: POCLITH, LITHIUM   No results found for: PHENYTOIN, PHENOBARB, VALPROATE, CBMZ   .res Assessment: Plan:   Discussed treatment options with patient and her husband.  Discussed that she has had some worsening in some of her signs and symptoms since starting Trintellix and is also having some new onset paranoia since starting Trintellix.  Discussed therefore discontinuing Trintellix at this time and then reevaluating in 1 month.  Will continue Trileptal, Buspar, and Xanax prn anxiety. Pt to f/u in 4 weeks or sooner if clinically. Patient advised to contact office with any questions, adverse effects, or acute worsening in signs and symptoms.  Meagan Moran was seen today for paranoid, anxiety and depression.  Diagnoses and all orders for this visit:  Generalized anxiety disorder  Episodic mood disorder ([place])     Please see After Visit Summary for patient specific instructions.  Future Appointments  Date Time Provider [place]  04/06/2020  1:00 PM Thayer Headings, PMHNP [facility] None  04/20/2020  4:00 PM Philemon Kingdom, MD [facility] None    No orders of the defined types were placed in this encounter.   -------------------------------

## 2020-03-13 ENCOUNTER — Ambulatory Visit: Payer: BC Managed Care – PPO | Admitting: Psychiatry

## 2020-03-22 ENCOUNTER — Ambulatory Visit: Payer: BC Managed Care – PPO | Admitting: Psychiatry

## 2020-03-29 ENCOUNTER — Ambulatory Visit: Payer: BC Managed Care – PPO | Admitting: Psychiatry

## 2020-04-06 ENCOUNTER — Ambulatory Visit (INDEPENDENT_AMBULATORY_CARE_PROVIDER_SITE_OTHER): Payer: BC Managed Care – PPO | Admitting: Psychiatry

## 2020-04-06 ENCOUNTER — Encounter: Payer: Self-pay | Admitting: Psychiatry

## 2020-04-06 ENCOUNTER — Other Ambulatory Visit: Payer: Self-pay

## 2020-04-06 DIAGNOSIS — F5105 Insomnia due to other mental disorder: Secondary | ICD-10-CM | POA: Diagnosis not present

## 2020-04-06 DIAGNOSIS — F331 Major depressive disorder, recurrent, moderate: Secondary | ICD-10-CM | POA: Diagnosis not present

## 2020-04-06 DIAGNOSIS — F99 Mental disorder, not otherwise specified: Secondary | ICD-10-CM | POA: Diagnosis not present

## 2020-04-06 DIAGNOSIS — F419 Anxiety disorder, unspecified: Secondary | ICD-10-CM | POA: Diagnosis not present
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
## 2020-04-07 ENCOUNTER — Telehealth: Payer: Self-pay | Admitting: Psychiatry

## 2020-04-07 NOTE — Telephone Encounter (Signed)
Attempted to call pt's therapist, Sherlean Foot, [facility] at [phone] at pt's request to coordinate care. Voicemail was full and unable to leave message.

## 2020-04-12 ENCOUNTER — Telehealth: Payer: Self-pay | Admitting: Psychiatry

## 2020-04-12 NOTE — Telephone Encounter (Signed)
Attempted to call pt's therapist, Sherlean Foot, [facility], as requested. Voicemail is full. Unable to leave message.

## 2020-04-13 ENCOUNTER — Telehealth: Payer: Self-pay | Admitting: Psychiatry

## 2020-04-13 NOTE — Telephone Encounter (Signed)
L/M for therapist, Sherlean Foot, [facility].  Requested that therapist return call to coordinate care and to discuss patient's treatment plan.  Communicated that signed information release was obtained at time of last visit and copy could be faxed to therapist.

## 2020-04-20 ENCOUNTER — Telehealth: Payer: Self-pay | Admitting: Psychiatry

## 2020-04-20 ENCOUNTER — Ambulatory Visit: Payer: BC Managed Care – PPO | Admitting: Internal Medicine

## 2020-04-20 ENCOUNTER — Encounter: Payer: Self-pay | Admitting: Internal Medicine

## 2020-04-20 ENCOUNTER — Other Ambulatory Visit: Payer: Self-pay

## 2020-04-20 VITALS — BP 120/86 | HR 71 | Ht 72.0 in | Wt 167.0 lb

## 2020-04-20 DIAGNOSIS — E782 Mixed hyperlipidemia: Secondary | ICD-10-CM

## 2020-04-20 DIAGNOSIS — R634 Abnormal weight loss: Secondary | ICD-10-CM

## 2020-04-20 DIAGNOSIS — E1165 Type 2 diabetes mellitus with hyperglycemia: Secondary | ICD-10-CM

## 2020-04-20 DIAGNOSIS — Z794 Long term (current) use of insulin: Secondary | ICD-10-CM

## 2020-04-20 LAB — POCT GLYCOSYLATED HEMOGLOBIN (HGB A1C): Hemoglobin A1C: 7.1 % — AB (ref 4.0–5.6)

## 2020-04-20 MED ORDER — METFORMIN HCL 500 MG PO TABS: 500.0000 mg | ORAL_TABLET | Freq: Two times a day (BID) | ORAL | 3 refills | Status: DC

## 2020-04-20 NOTE — Patient Instructions (Addendum)
Please increase: - Metformin 500 mg 2x a day with meals  Please return in 4 months with your sugar log.

## 2020-04-20 NOTE — Progress Notes (Signed)
Patient ID: Meagan Moran, female   DOB: 1956/10/31, 64 y.o.   MRN: [id number]   This visit occurred during the SARS-CoV-2 public health emergency.  Safety protocols were in place, including screening questions prior to the visit, additional usage of staff PPE, and extensive cleaning of exam room while observing appropriate contact time as indicated for disinfecting solutions.   HPI: Meagan Moran is a 64 y.o.-year-old female, initially referred by her PCP, Dr. Merri Brunette, returning for follow-up for DM2, dx in 2005, prev. insulin-dependent, uncontrolled, without long-term complications. Last visit 7 months ago. She is here with her husband who offers most of her hx as patient is mostly nonverbal.  She had a period in 01-12/2019 when she did not eat.  Even now, her husband is having trouble feeding her but she is eating approximately 1000 cal a day.  She is also encouraged by her husband to stay active.  Husband also tells me that they have to take her insulin away and lock it as she was using too much insulin, even when sugars were only slightly above normal.  As of now, she is not given any insulin.  Her sugars did improve after this measure.  Reviewed HbA1c levels: Lab Results  Component Value Date   HGBA1C 6.2 (A) 09/23/2019  07/01/2019: HbA1c 6.9% 09/16/2018: HbA1c 7.1%  Pt is on a regimen of: - Metformin 500 mg 2x a day, with meals >> 500 mg with breakfast She is using NovoLog as needed 6 units for very high blood sugars now.  She was previously on: - Tresiba 90 +/-20 units at bedtime >> stopped 05/2019. - Novolog ~10 units 3x a day, before meals  - Jardiance >> yeast inf's.  She checks her sugars more than 4 times a day with her CGM.  Freestyle libre CGM parameters (we will scan the reports): - Average: 131 >> 142 - % active CGM time: 93% >> 32% of the time - Glucose variability 31.6% >> 20.4%(target < or = to 36%) - time in range:  - very low (<54): 1% >> 0% - low  (54-69): 6% >> 0% - normal range (70-180): 82% >> 91% - high sugars (181-250): 11% >> 8% - very high sugars (>250): 0% >> 1%  Per review of the CGM tracings, sugars are: - am:137-179 - 2h after b'fast: 135-289 - lunch: 119-137 - 2h after lunch: 90-150 - dinner:90-120 - 2h after dinner: 100-175 - bedtime:see above  Lowest sugar was 30 >> 53 (Libre) >> 90; she has hypoglycemia awareness at 100.  Highest sugar was 300 >> 243 (libre) >> 298.  Previously:   Glucometer: [employer] 2, One Touch  Pt's meals are: (pbs with teeth, cannot eat hard foods) - Breakfast: trying oatmeal - Lunch: cold cut meats + cheese puffs, 3 musketeers bar - Dinner: Different - Snacks: chocolate, juice; also snacks at night Tries to walk for exercise daily.  -No CKD, last BUN/creatinine:  06/24/2019: 7/0.58, glucose 172 Lab Results  Component Value Date   BUN 9 01/31/2020   CREATININE 0.65 01/31/2020  On losartan.  -+ HL; last set of lipids: 06/24/2019: 207/130/43/138 09/16/2018: 267/404/38/190 No results found for: CHOL, HDL, LDLCALC, LDLDIRECT, TRIG, CHOLHDL On pravastatin 20 and Vascepa.  - last eye exam was 01/2020: No DR, + cataract  -No numbness and tingling in her feet.  Latest diabetic foot exam was on 07/01/2019: Abnormal: callused feet, onychomycosis, long nails; but with normal circulation and response to monofilament.  She is on  Cymbalta.  Latest TSH was normal: 06/24/2019: TSH 1.06  Pt has FH of DM in [place], [place], M aunt, M cousins.  Before last visit, she described weight loss: 70 lbs in last 7 mo- husband attributed this to stress and decreased appetite  ROS: Constitutional: no weight gain/+ weight loss, no fatigue, no subjective hyperthermia, no subjective hypothermia Eyes: no blurry vision, no xerophthalmia ENT: no sore throat, no nodules palpated in neck, no dysphagia, no odynophagia, no hoarseness Cardiovascular: no CP/no SOB/no palpitations/no leg swelling Respiratory: no  cough/no SOB/no wheezing Gastrointestinal: no N/no V/no D/no C/no acid reflux Musculoskeletal: no muscle aches/no joint aches Skin: no rashes, no hair loss Neurological: no tremors/no numbness/no tingling/no dizziness  I reviewed pt's medications, allergies, PMH, social hx, family hx, and changes were documented in the history of present illness. Otherwise, unchanged from my initial visit note.  Past Medical History:  Diagnosis Date  . Back pain   . Diabetes mellitus, type II ([place])   . Heart murmur   . Sleep apnea    Past Surgical History:  Procedure Laterality Date  . CESAREAN SECTION     Social History   Socioeconomic History  . Marital status: Married    Spouse name: Not on file  . Number of children: 2  . Years of education: Not on file  . Highest education level: Not on file  Occupational History  .  Early retirement  Social Needs  . Financial resource strain: Not on file  . Food insecurity    Worry: Not on file    Inability: Not on file  . Transportation needs    Medical: Not on file    Non-medical: Not on file  Tobacco Use  . Smoking status: Current Every Day Smoker  . Smokeless tobacco: Never Used  Substance and Sexual Activity  . Alcohol use:  Once a year  . Drug use: No   Current Outpatient Medications on File Prior to Visit  Medication Sig Dispense Refill  . Acetylcysteine (NAC 600 PO) Take by mouth.    . ALPRAZolam (XANAX) 0.5 MG tablet Take 1 tablet (0.5 mg total) by mouth 3 (three) times daily as needed for anxiety. 90 tablet 1  . amLODipine (NORVASC) 5 MG tablet TAKE 1 TABLET EVERY DAY AT LUNCH    . busPIRone (BUSPAR) 30 MG tablet Take 1 tablet (30 mg total) by mouth 2 (two) times daily. 180 tablet 0  . Continuous Blood Gluc Receiver (FREESTYLE LIBRE 2 READER SYSTM) DEVI 1 each by Does not apply route once for 1 dose. 1 each 0  . Continuous Blood Gluc Sensor (FREESTYLE LIBRE 2 SENSOR SYSTM) MISC 1 each by Does not apply route every 14 (fourteen) days.  6 each 3  . Cyanocobalamin (VITAMIN B12 PO) Take by mouth.    Marland Kitchen glucagon (GLUCAGON EMERGENCY) 1 MG injection Inject 1 mg into the muscle once as needed for up to 1 dose. 1 each 12  . GLUTATHIONE PO Take by mouth.    . hydrochlorothiazide (MICROZIDE) 12.5 MG capsule Take 12.5 mg by mouth daily as needed.    Marland Kitchen HYDROcodone-acetaminophen (NORCO) 7.5-325 MG tablet     . losartan (COZAAR) 100 MG tablet Take 100 mg by mouth daily.    Marland Kitchen MELATONIN PO Take 20 mg by mouth. Taking 1 tab in the evening and 1 tab before bedtime.    . metFORMIN (GLUCOPHAGE) 500 MG tablet Take 1 tablet (500 mg total) by mouth daily with breakfast. 90 tablet 3  .  Multiple Vitamins-Minerals (ZINC PO) Take by mouth.    . Oxcarbazepine (TRILEPTAL) 300 MG tablet Take 1 tablet (300 mg total) by mouth at bedtime. 90 tablet 0  . pravastatin (PRAVACHOL) 20 MG tablet Take 20 mg by mouth daily.    . Probiotic Product (PROBIOTIC PO) Take by mouth.    Marland Kitchen tiZANidine (ZANAFLEX) 4 MG tablet TAKE 1 TABLET 4 TIMES A DAY AS NEEDED FOR MUSCLE SPASMS    . VITAMIN D PO Take by mouth.     No current facility-administered medications on file prior to visit.   Allergies  Allergen Reactions  . Nsaids   . Sulfa Antibiotics    Family History  Problem Relation Age of Onset  . Anxiety disorder Mother   . Anxiety disorder Maternal Grandmother   . Anxiety disorder Daughter   She also has a history of HL and lung cancer in father; heart disease in mother.  PE: BP 120/86   Pulse 71   Ht 6' (1.829 m)   Wt 167 lb (75.8 kg)   SpO2 96%   BMI 22.65 kg/m  Wt Readings from Last 3 Encounters:  04/20/20 167 lb (75.8 kg)  09/23/19 218 lb 9.6 oz (99.2 kg)  08/05/19 219 lb (99.3 kg)   Constitutional: overweight, in NAD Eyes: PERRLA, EOMI, no exophthalmos ENT: moist mucous membranes, no thyromegaly, no cervical lymphadenopathy Cardiovascular: RRR, No MRG Respiratory: CTA B Gastrointestinal: abdomen soft, NT, ND, BS+ Musculoskeletal: no  deformities, strength intact in all 4 Skin: moist, warm, no rashes Neurological: no tremor with outstretched hands, DTR normal in all 4  ASSESSMENT: 1. DM2, insulin-dependent, uncontrolled, without long-term complications, but with hypo and hyperglycemia In 2020, I received a message from Robley Fries, PhD, her counselor, letting me know about the patient's anxiety/depression and the fact that she may overcorrect low blood sugars or eat a lot of sweets to try to prevent blood sugar crashes.  2. HL  3.  Significant weight loss  PLAN:  1. Patient with longstanding, uncontrolled, type 2 diabetes, on oral antidiabetic regimen with Metformin only, off insulin at last visit.  At that time, we reviewed together the downloaded reports from her freestyle libre 2 CGM.  Sugars appeared well controlled with only few exceptions.  There was a transfer slightly higher sugars at night, in the 1 60-1 70 range, but during the day sugars are at goal.  She had occasional low blood sugars in the 50s to 60s as checked by CGM, but we discussed that these are usually higher each checked with a regular meter, by 20 and even occasionally 30 mg/dL.  We discussed about checking sugars with a regular glucometer whenever her CGM is showing below.  At that time she also had significant spikes in blood sugars around 3:57 in the morning and she confirmed that she was eating a snack at that time.  She then corrected the high blood sugar after the snack with 6 units of NovoLog and we discussed about stopping the snacks.  Also, if  correcting a blood sugar higher than 220, to use a lower NovoLog dose. -At this visit, patient returns after losing a significant amount of weight since last visit, 51 pounds.  She was not eating well and taking too much insulin at the beginning of the year and her insulin was taken away by her husband.  As of now, sugars are better, with an average of 142 for the last 2 weeks, however, they are still high  in the  morning so we discussed about adding another Metformin tablet with dinner.  Otherwise, I agree with the husband that she does not need any insulin. - I suggested to:  Patient Instructions  Please increase: - Metformin 500 mg 2x a day with meals  Please return in 4 months with your sugar log.   - we checked her HbA1c: 7.1% (higher) - advised to check sugars at different times of the day - 4x a day, rotating check times - advised for yearly eye exams >> she is UTD - return to clinic in 4 months  2. HL -Reviewed latest lipid panel from 05/2019: Improved, but LDL still above goal -Continues the statin and Vascepa, without side effects  3.  Significant weight loss -At last visit weight was stable, previously lost a significant amount of weight. We discussed about stopping snacks at night -At this visit, since last visit she lost 51 pounds in the last 7 months.  Husband is doing his best to feed her but he feels that at her best she is not eating more than 2000 cal a day.  They are increasing her calories as possible.  Carlus Pavlov, MD PhD [facility] Endocrinology

## 2020-04-20 NOTE — Addendum Note (Signed)
Addended by: Darliss Ridgel I on: 04/20/2020 05:03 PM   Modules accepted: Orders

## 2020-04-20 NOTE — Telephone Encounter (Signed)
Received message from patient's therapist that she is available after 6 PM.  Attempted to call therapist this evening at 6:15 PM.  No answer.  Left message that provider is continuing to try to reach her and requested that she contact office with times that she is available during the day.  Left message that this provider would be out of the office and returning on Wednesday and we tried to reach her again at that time.

## 2020-05-03 ENCOUNTER — Telehealth: Payer: Self-pay | Admitting: Psychiatry

## 2020-05-03 NOTE — Telephone Encounter (Signed)
Attempted to reach therapist, Sherlean Foot, [facility] again. No answer and unable to leave a voicemail due to mail box being full.

## 2020-07-15 ENCOUNTER — Other Ambulatory Visit: Payer: Self-pay | Admitting: Internal Medicine

## 2020-07-15 DIAGNOSIS — E1165 Type 2 diabetes mellitus with hyperglycemia: Secondary | ICD-10-CM

## 2020-07-18 ENCOUNTER — Other Ambulatory Visit: Payer: Self-pay | Admitting: Psychiatry

## 2020-07-18 DIAGNOSIS — F411 Generalized anxiety disorder: Secondary | ICD-10-CM

## 2020-07-19 NOTE — Telephone Encounter (Signed)
Last apt 04/06/20, nothing scheduled. Several phone calls since then

## 2020-08-22 ENCOUNTER — Ambulatory Visit: Payer: BC Managed Care – PPO | Admitting: Internal Medicine

## 2020-08-22 ENCOUNTER — Other Ambulatory Visit: Payer: Self-pay

## 2020-08-22 ENCOUNTER — Encounter: Payer: Self-pay | Admitting: Internal Medicine

## 2020-08-22 VITALS — BP 138/80 | HR 71 | Ht 72.0 in | Wt 151.0 lb

## 2020-08-22 DIAGNOSIS — E1165 Type 2 diabetes mellitus with hyperglycemia: Secondary | ICD-10-CM

## 2020-08-22 DIAGNOSIS — R634 Abnormal weight loss: Secondary | ICD-10-CM

## 2020-08-22 DIAGNOSIS — E782 Mixed hyperlipidemia: Secondary | ICD-10-CM

## 2020-08-22 DIAGNOSIS — Z794 Long term (current) use of insulin: Secondary | ICD-10-CM | POA: Diagnosis not present

## 2020-08-22 LAB — POCT GLYCOSYLATED HEMOGLOBIN (HGB A1C): Hemoglobin A1C: 6.4 % — AB (ref 4.0–5.6)

## 2020-08-22 NOTE — Patient Instructions (Addendum)
Please continue: - Metformin 500 mg 2x a day with meals - move it with lunch and dinner  Please return in 4 months with your sugar log.

## 2020-08-22 NOTE — Progress Notes (Signed)
Patient ID: Meagan Moran, female   DOB: 1956/04/02, 64 y.o.   MRN: [id number]   This visit occurred during the SARS-CoV-2 public health emergency.  Safety protocols were in place, including screening questions prior to the visit, additional usage of staff PPE, and extensive cleaning of exam room while observing appropriate contact time as indicated for disinfecting solutions.   HPI: Meagan Moran is a 64 y.o.-year-old female, initially referred by her PCP, Dr. Merri Brunette, returning for follow-up for DM2, dx in 2005, prev. insulin-dependent, uncontrolled, without long-term complications. Last visit 4 months ago.  She is here with her husband who offers most of her history as patient is mostly nonverbal.  At this visit, pt continues to lose weight and husband reports that this b/c she did not have lower teeth >> could not eat well >> she went to the dentist yesterday and was fitted for dentures.  Reviewed history: She had a period in 01-12/2019 when she did not eat.  Even now, her husband is having trouble feeding her but she is eating approximately 1000 cal a day.  She is also encouraged by her husband to stay active.  Husband also tells me that they have to take her insulin away and lock it as she was using too much insulin, even when sugars were only slightly above normal.  As of now, she is not given any insulin.  Her sugars did improve after this measure.  Reviewed HbA1c levels: Lab Results  Component Value Date   HGBA1C 7.1 (A) 04/20/2020   HGBA1C 6.2 (A) 09/23/2019  07/01/2019: HbA1c 6.9% 09/16/2018: HbA1c 7.1%  She is on: - Metformin 500 mg 2x a day, with meals >> 500 mg with breakfast >> 500 mg twice a day with meals She is using NovoLog as needed 6 units for very high blood sugars now.  She was previously on: - Tresiba 90 +/-20 units at bedtime >> stopped 05/2019. - Novolog ~10 units 3x a day, before meals  - Jardiance >> yeast inf's.  She checks her sugars more than 4  times a day with her freestyle libre CGM:  Freestyle libre CGM parameters: - Average: 131 >> 142 >> 140 - % active CGM time: 93% >> 32% >> 30% of the time - Glucose variability 31.6% >> 20.4% >> 18.1% (target < or = to 36%) - time in range:  - very low (<54): 1% >> 0% >> 0% - low (54-69): 6% >> 0% >> 0% - normal range (70-180): 82% >> 91% >> 92%  - high sugars (181-250): 11% >> 8% >> 8% - very high sugars (>250): 0% >> 1% >> 0%    Lowest sugar was 30 >> 53 (Libre) >> 90 >> 95; she has hypoglycemia awareness at 100. Highest sugar was 300 >> 243 (libre) >> 298 >> 240.   Glucometer: Freestyle Libre 2, One Touch  Pt's meals are: (Problems with these) - Breakfast: trying oatmeal - Lunch: cold cut meats + cheese puffs, 3 [employer] bar - Dinner: Different - Snacks: chocolate, juice; also snacks at night Tries to walk for exercise daily.  -No CKD, last BUN/creatinine:  Lab Results  Component Value Date   BUN 9 01/31/2020   CREATININE 0.65 01/31/2020  06/24/2019: 7/0.58, glucose 172 On losartan.  -+ HL L; last set of lipids: 06/24/2019: 207/130/43/138 09/16/2018: 267/404/38/190 No results found for: CHOL, HDL, LDLCALC, LDLDIRECT, TRIG, CHOLHDL Not on pravastatin 20 b/c upset stomach.  Previously on Vascepa - stopped as she had GI  sxs from it.  - last eye exam was 01/2020: No DR, + cataract  -She denies numbness and tingling in her feet.  On her exam from 06/2019: Callused feet, onychomycosis, long nails; but with normal circulation and response to monofilament.  She continues on Cymbalta.  Latest TSH is normal: No results found for: TSH 06/24/2019: TSH 1.06  Pt has FH of DM in [place], [place], M aunt, M cousins.  Before the end of last year, she described weight loss: 70 lbs in last 7 mo- husband attributed this to stress and decreased appetite  ROS: Constitutional: + weight loss, + fatigue, no subjective hyperthermia, no subjective hypothermia Eyes: no blurry vision, no  xerophthalmia ENT: no sore throat, no nodules palpated in neck, no dysphagia, no odynophagia, no hoarseness Cardiovascular: no CP/no SOB/no palpitations/no leg swelling Respiratory: no cough/no SOB/no wheezing Gastrointestinal: no N/no V/no D/no C/no acid reflux Musculoskeletal: no muscle aches/no joint aches Skin: no rashes, no hair loss Neurological: no tremors/no numbness/no tingling/no dizziness  I reviewed pt's medications, allergies, PMH, social hx, family hx, and changes were documented in the history of present illness. Otherwise, unchanged from my initial visit note.  Past Medical History:  Diagnosis Date  . Back pain   . Diabetes mellitus, type II ([facility])   . Heart murmur   . Sleep apnea    Past Surgical History:  Procedure Laterality Date  . CESAREAN SECTION     Social History   Socioeconomic History  . Marital status: Married    Spouse name: Not on file  . Number of children: 2  . Years of education: Not on file  . Highest education level: Not on file  Occupational History  .  Early retirement  Social Needs  . Financial resource strain: Not on file  . Food insecurity    Worry: Not on file    Inability: Not on file  . Transportation needs    Medical: Not on file    Non-medical: Not on file  Tobacco Use  . Smoking status: Current Every Day Smoker  . Smokeless tobacco: Never Used  Substance and Sexual Activity  . Alcohol use:  Once a year  . Drug use: No   Current Outpatient Medications on File Prior to Visit  Medication Sig Dispense Refill  . Acetylcysteine (NAC 600 PO) Take by mouth.    . ALPRAZolam (XANAX) 0.5 MG tablet TAKE 1 TABLET BY MOUTH 3 TIMES A DAY AS NEEDED FOR ANXIETY 90 tablet 0  . amLODipine (NORVASC) 5 MG tablet TAKE 1 TABLET EVERY DAY AT LUNCH    . busPIRone (BUSPAR) 30 MG tablet Take 1 tablet (30 mg total) by mouth 2 (two) times daily. 180 tablet 0  . Continuous Blood Gluc Sensor (FREESTYLE LIBRE 2 SENSOR) MISC APPLY EVERY 14 DAYS 6 each 3   . Cyanocobalamin (VITAMIN B12 PO) Take by mouth.    Marland Kitchen glucagon (GLUCAGON EMERGENCY) 1 MG injection Inject 1 mg into the muscle once as needed for up to 1 dose. 1 each 12  . GLUTATHIONE PO Take by mouth.    Marland Kitchen HYDROcodone-acetaminophen (NORCO) 7.5-325 MG tablet     . losartan (COZAAR) 100 MG tablet Take 100 mg by mouth daily.    Marland Kitchen MELATONIN PO Take 20 mg by mouth. Taking 1 tab in the evening and 1 tab before bedtime.    . metFORMIN (GLUCOPHAGE) 500 MG tablet Take 1 tablet (500 mg total) by mouth 2 (two) times daily with a meal. 180 tablet  3  . Multiple Vitamins-Minerals (ZINC PO) Take by mouth.    . Probiotic Product (PROBIOTIC PO) Take by mouth.    Marland Kitchen tiZANidine (ZANAFLEX) 4 MG tablet TAKE 1 TABLET 4 TIMES A DAY AS NEEDED FOR MUSCLE SPASMS    . VITAMIN D PO Take by mouth.    . Continuous Blood Gluc Receiver (FREESTYLE LIBRE 2 READER SYSTM) DEVI 1 each by Does not apply route once for 1 dose. 1 each 0  . hydrochlorothiazide (MICROZIDE) 12.5 MG capsule Take 12.5 mg by mouth daily as needed. (Patient not taking: Reported on 08/22/2020)    . Oxcarbazepine (TRILEPTAL) 300 MG tablet Take 1 tablet (300 mg total) by mouth at bedtime. 90 tablet 0  . pravastatin (PRAVACHOL) 20 MG tablet Take 20 mg by mouth daily. (Patient not taking: Reported on 08/22/2020)     No current facility-administered medications on file prior to visit.   Allergies  Allergen Reactions  . Nsaids   . Sulfa Antibiotics    Family History  Problem Relation Age of Onset  . Anxiety disorder Mother   . Anxiety disorder Maternal Grandmother   . Anxiety disorder Daughter   She also has a history of HL and lung cancer in father; heart disease in mother.  PE: BP 138/80   Pulse 71   Ht 6' (1.829 m)   Wt 151 lb (68.5 kg)   SpO2 98%   BMI 20.48 kg/m  Wt Readings from Last 3 Encounters:  08/22/20 151 lb (68.5 kg)  04/20/20 167 lb (75.8 kg)  09/23/19 218 lb 9.6 oz (99.2 kg)   Constitutional: overweight, in NAD Eyes: PERRLA,  EOMI, no exophthalmos ENT: moist mucous membranes, no thyromegaly, no cervical lymphadenopathy Cardiovascular: RRR, No MRG Respiratory: CTA B Gastrointestinal: abdomen soft, NT, ND, BS+ Musculoskeletal: no deformities, strength intact in all 4 Skin: moist, warm, no rashes Neurological: no tremor with outstretched hands, DTR normal in all 4  ASSESSMENT: 1. DM2, insulin-dependent, uncontrolled, without long-term complications, but with hypo and hyperglycemia In 2020, I received a message from Robley Fries, PhD, her counselor, letting me know about the patient's anxiety/depression and the fact that she may overcorrect low blood sugars or eat a lot of sweets to try to prevent blood sugar crashes.  2. HL  3.  Significant weight loss  PLAN:  1. Patient with longstanding type 2 diabetes, on oral antidiabetic regimen with Metformin only after she came off insulin due to significant weight loss.  At last visit, patient returned after she lost 61 pounds.  She was not eating well and taking too much insulin at the beginning of the year so her insulin was taken away by her husband.  At last visit, sugars were better, but still high in the morning.  We discussed about adding another Metformin tablet with dinner.  Otherwise, I did not recommend to start insulin.  HbA1c then was 7.1%. CGM interpretation: - at this visit, we reviewed together her CGM traces >> 92% sugars are in target range, which is excellent. No lows. Her sugars appear to increase after lunch, but husband reports that this is most likely 2/2 stress that what she eats (usually bisquits + gravy). Her sugars o/n are exemplary, stable, in range. They have loss of data b/w 3 pm and bedtime >> discussed to scan every 8h. Also, she wears the CGM only 30% of the time 2/2 battery issues... - for now, I advised them to move the first Metformin dose with lunch (now taking it  on an empty stomach in am) to hopefully improve post-lunch sugars - I  suggested to:  Patient Instructions  Please continue: - Metformin 500 mg 2x a day with meals - move it with lunch and dinner  Please return in 4 months with your sugar log.   - we checked her HbA1c: 6.4% (lower) - advised to check sugars at different times of the day - 1x a day, rotating check times - advised for yearly eye exams >> she is UTD - return to clinic in 4 months  2. HL -Reviewed latest lipid panel from 05/2019: Improved, but LDL still above goal -she apperantly stopped Pravastatin b/c stomach discomfort - has Lipids coming up with PCP next month   3.  Significant weight loss -She lost 51 pounds in the 7 mo before last visit. Since last OV, she lost 15 more lbs... -We do not have her on diabetic medications with weight loss as a side effect.  Metformin is weight neutral. -now fitted for new dentures   Carlus Pavlovristina Glendora Clouatre, MD PhD [facility]

## 2020-09-14 ENCOUNTER — Other Ambulatory Visit: Payer: Self-pay | Admitting: Psychiatry

## 2020-09-14 DIAGNOSIS — F411 Generalized anxiety disorder: Secondary | ICD-10-CM

## 2020-10-09 ENCOUNTER — Other Ambulatory Visit: Payer: Self-pay | Admitting: Psychiatry

## 2020-10-09 DIAGNOSIS — F411 Generalized anxiety disorder: Secondary | ICD-10-CM

## 2020-11-17 ENCOUNTER — Other Ambulatory Visit: Payer: Self-pay | Admitting: Psychiatry

## 2020-11-17 DIAGNOSIS — F411 Generalized anxiety disorder: Secondary | ICD-10-CM

## 2020-12-05 ENCOUNTER — Other Ambulatory Visit: Payer: Self-pay | Admitting: Psychiatry

## 2020-12-05 DIAGNOSIS — F411 Generalized anxiety disorder: Secondary | ICD-10-CM

## 2020-12-05 NOTE — Telephone Encounter (Signed)
Called Meagan Moran to make an appt and her husband answered. When I asked for her he said if you dont want to talk to me and then he hung up.

## 2021-01-03 ENCOUNTER — Other Ambulatory Visit: Payer: Self-pay | Admitting: Psychiatry

## 2021-01-03 DIAGNOSIS — F411 Generalized anxiety disorder: Secondary | ICD-10-CM

## 2021-01-08 ENCOUNTER — Other Ambulatory Visit: Payer: Self-pay | Admitting: Psychiatry

## 2021-01-08 DIAGNOSIS — F411 Generalized anxiety disorder: Secondary | ICD-10-CM

## 2021-01-09 NOTE — Telephone Encounter (Signed)
Last apt was 03/2020, not sure when follow up needed

## 2021-02-15 IMAGING — MR MRI LUMBAR SPINE WITHOUT CONTRAST
4 of 5 series · 25 of 48 positions shown · non-contrast
Comparison: 06/09/2009 lumbar spine MRI

CLINICAL DATA: Low back pain with bilateral lower extremity
weakness.

EXAM:
MRI LUMBAR SPINE WITHOUT CONTRAST
TECHNIQUE: Multiplanar, multisequence MR imaging of the lumbar spine was
performed. No intravenous contrast was administered.

[Series 2: T1 · sagittal · 4.0mm · 0.55mm/px · 6 of 16 slices shown (1 of 2)]
[im 1/16]
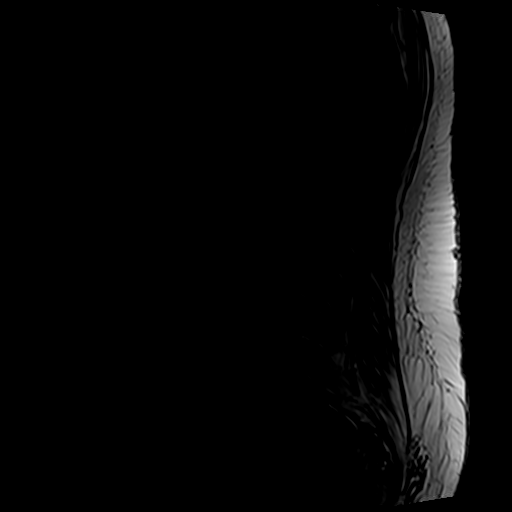
[im 4/16]
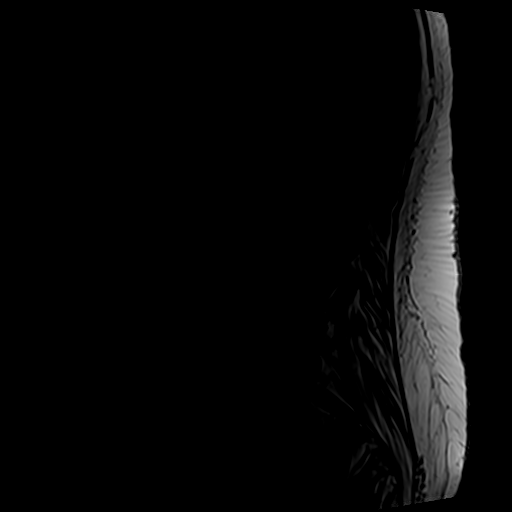
[im 7/16]
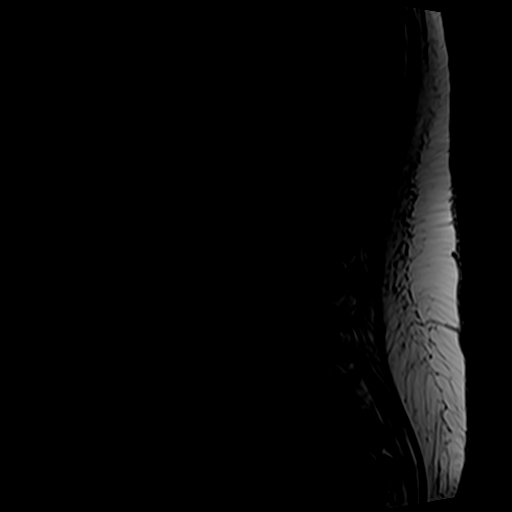
[im 10/16]
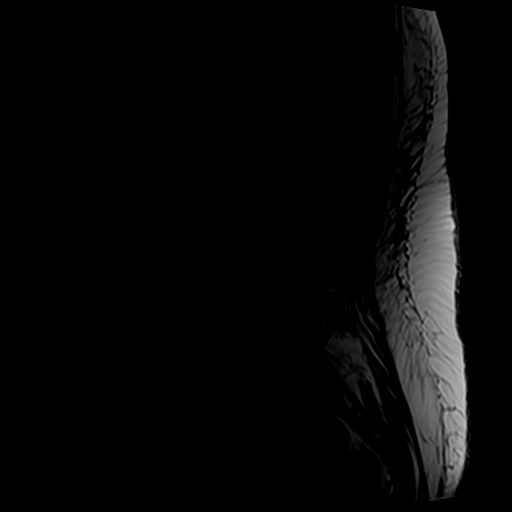
[im 13/16]
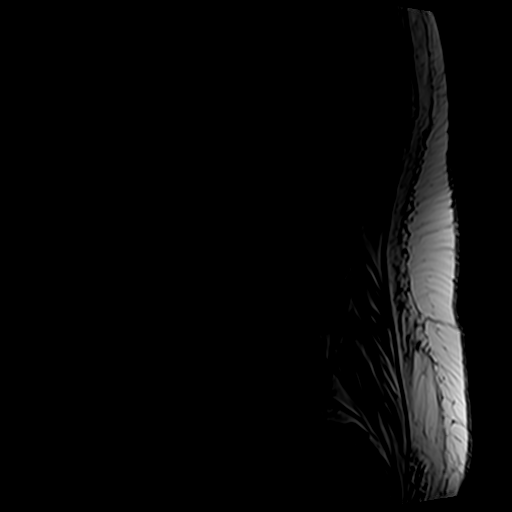
[im 16/16]
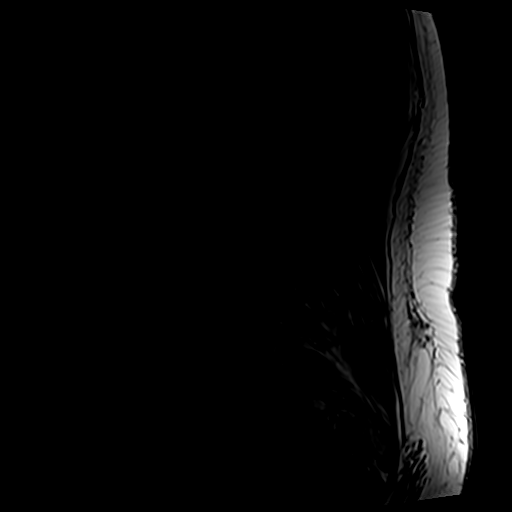

[Series 3: T2 · sagittal · 4.0mm · 0.55mm/px · 6 of 16 slices shown (1 of 2)]
[im 1/16]
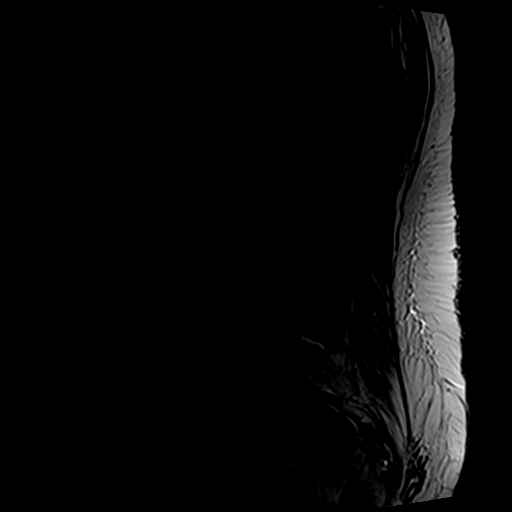
[im 4/16]
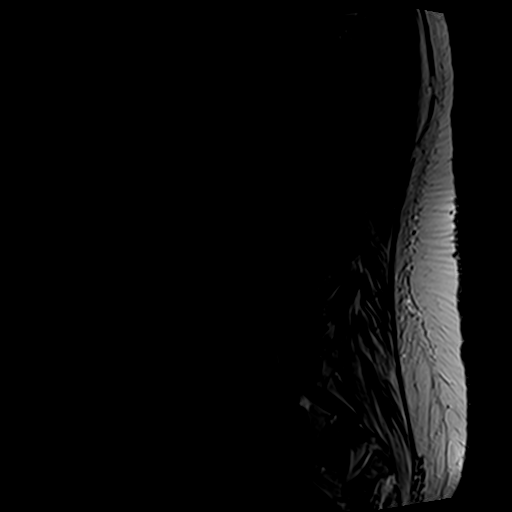
[im 7/16]
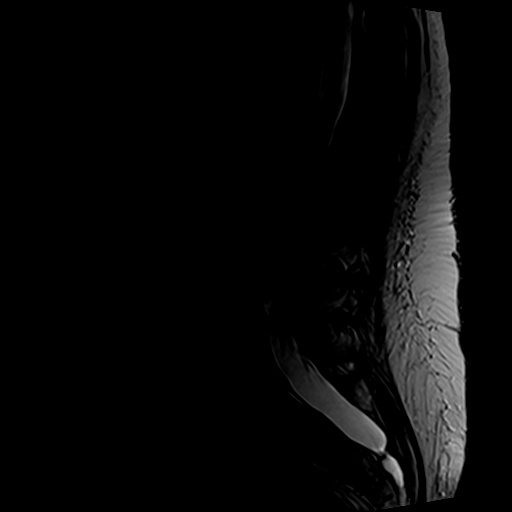
[im 10/16]
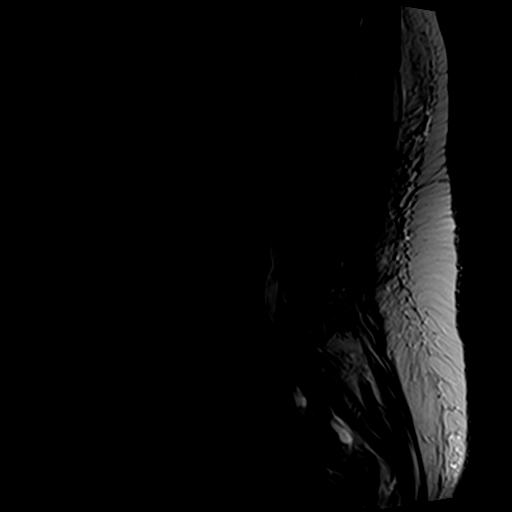
[im 13/16]
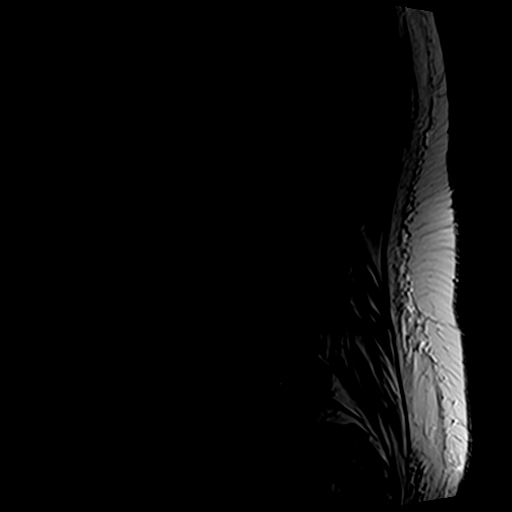
[im 16/16]
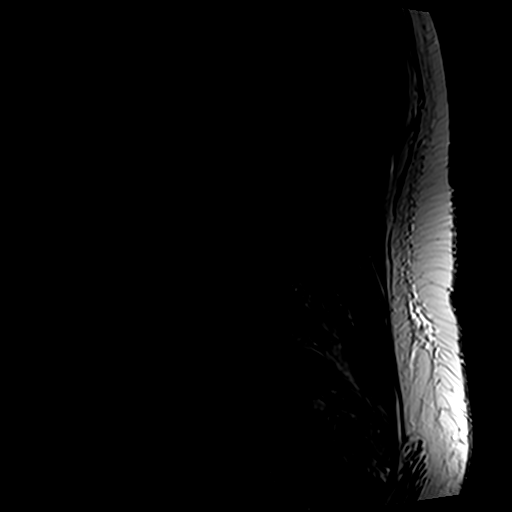

[Series 5: T2 · axial · 4.0mm · 0.70mm/px · z∈[-50,+166]mm · 9 of 37 slices shown (2 of 2)]
[im 1/37]
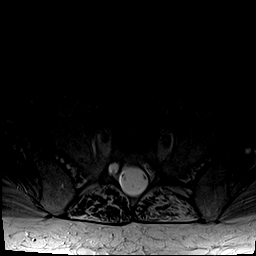
[im 6/37]
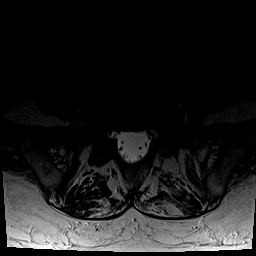
[im 11/37]
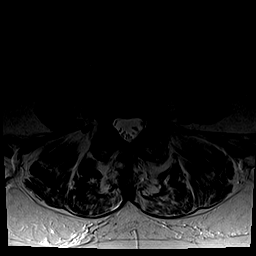
[im 16/37]
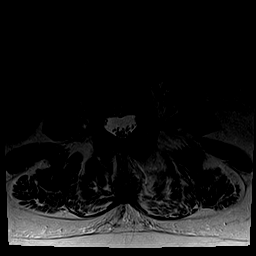
[im 19/37]
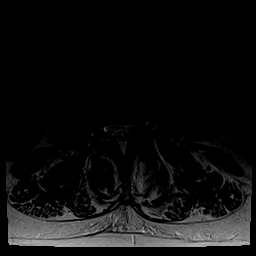
[im 21/37]
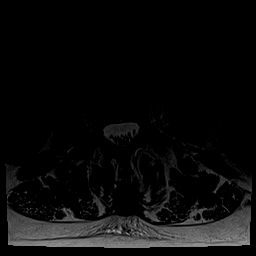
[im 26/37]
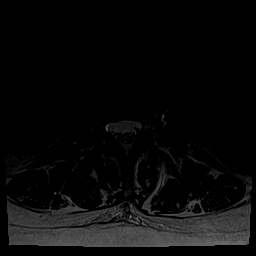
[im 31/37]
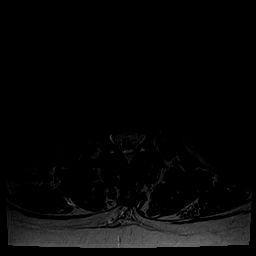
[im 37/37]
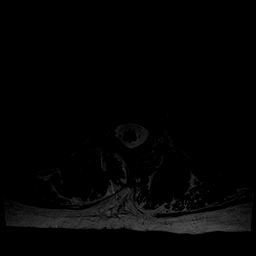

[Series 6: T1 · axial · 4.0mm · 0.35mm/px · z∈[-50,+123]mm · 4 of 37 slices shown (2 of 2)]
[im 1/37]
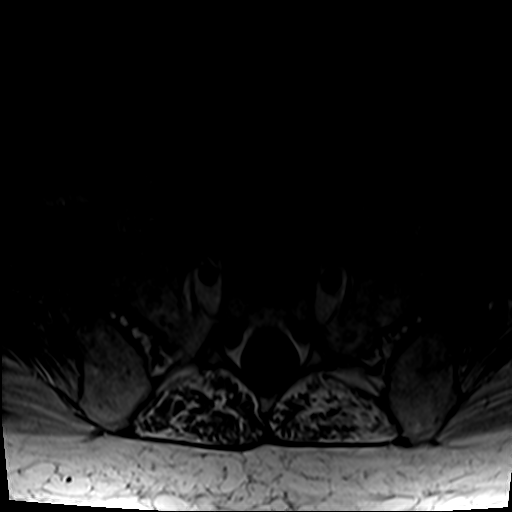
[im 6/37]
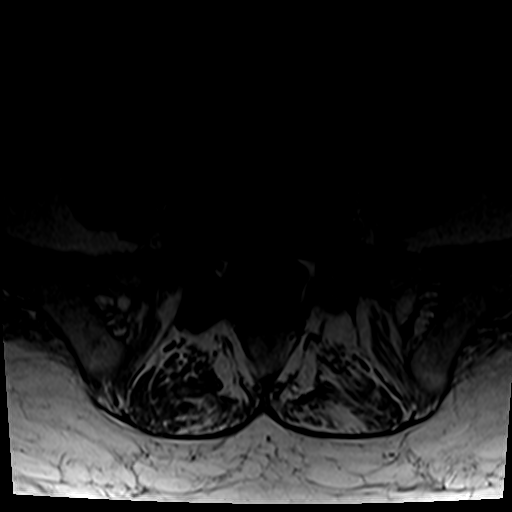
[im 19/37]
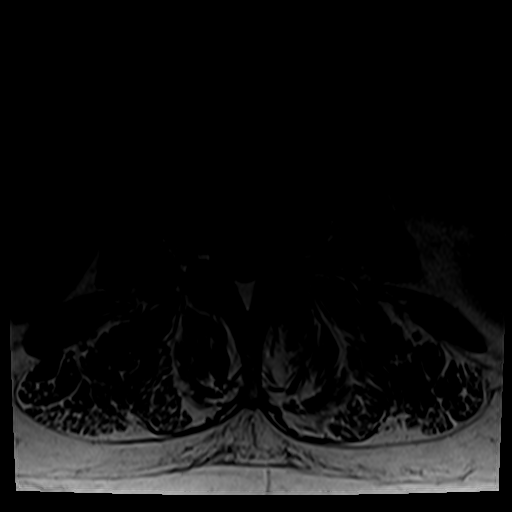
[im 31/37]
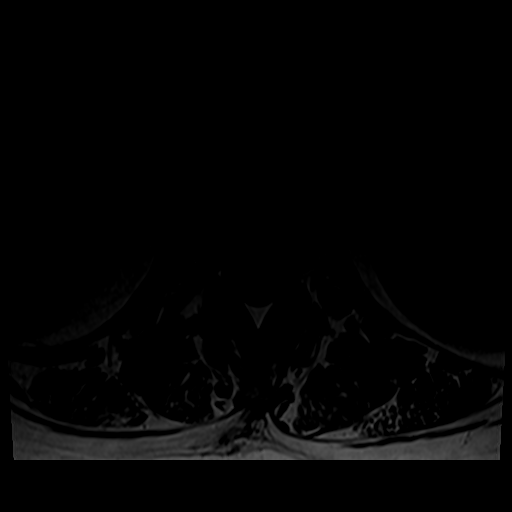

[25 of 48 positions shown; findings below may reference images not displayed]

FINDINGS: Segmentation:  Normal

Alignment:  Normal

Vertebrae: Mild height loss at L1 without edema. Type 1 degenerative
endplate signal changes at L3-4.

Conus medullaris and cauda equina: Conus extends to the L2 level.
Conus and cauda equina appear normal.

Paraspinal and other soft tissues: Negative

Disc levels:

T12-L1: Normal.

L1-2: Small right subarticular disc protrusion without spinal canal
or neural foraminal stenosis.

L2-3: No disc herniation or stenosis.

L3-4: Mild disc bulge slightly progressed. Left-greater-than-right
facet hypertrophy. No spinal canal stenosis. No neural foraminal
stenosis.

L4-5: Mild right and moderate left facet hypertrophy, worsened since
prior. Mild disc bulge. Moderate left foraminal stenosis, worsened
from the prior study.

L5-S1: Severe right foraminal stenosis due to combination of facet
hypertrophy and right foraminal disc osteophyte complex. No spinal
canal stenosis.
IMPRESSION: 1. Severe right L5 neural foraminal stenosis, unchanged since
06/09/2009.
[DATE]. Moderate left L4 neural foraminal stenosis, worsened compared to
06/09/2009.

## 2021-02-19 ENCOUNTER — Ambulatory Visit: Payer: BC Managed Care – PPO | Admitting: Internal Medicine

## 2021-02-19 ENCOUNTER — Other Ambulatory Visit: Payer: Self-pay

## 2021-02-19 ENCOUNTER — Encounter: Payer: Self-pay | Admitting: Internal Medicine

## 2021-02-19 VITALS — BP 110/78 | HR 50 | Ht 72.0 in | Wt 146.4 lb

## 2021-02-19 DIAGNOSIS — Z794 Long term (current) use of insulin: Secondary | ICD-10-CM | POA: Diagnosis not present

## 2021-02-19 DIAGNOSIS — R634 Abnormal weight loss: Secondary | ICD-10-CM | POA: Diagnosis not present

## 2021-02-19 DIAGNOSIS — E1165 Type 2 diabetes mellitus with hyperglycemia: Secondary | ICD-10-CM | POA: Diagnosis not present

## 2021-02-19 DIAGNOSIS — E782 Mixed hyperlipidemia: Secondary | ICD-10-CM

## 2021-02-19 LAB — POCT GLYCOSYLATED HEMOGLOBIN (HGB A1C): Hemoglobin A1C: 6.4 % — AB (ref 4.0–5.6)

## 2021-02-19 NOTE — Progress Notes (Signed)
Patient ID: Meagan Moran, female   DOB: 1956/01/25, 65 y.o.   MRN: [id number]   This visit occurred during the SARS-CoV-2 public health emergency.  Safety protocols were in place, including screening questions prior to the visit, additional usage of staff PPE, and extensive cleaning of exam room while observing appropriate contact time as indicated for disinfecting solutions.   HPI: Meagan Moran is a 65 y.o.-year-old female, initially referred by her PCP, Dr. Merri Brunetteandace Moran, returning for follow-up for DM2, dx in 2005, prev. insulin-dependent, uncontrolled, without long-term complications. Last visit 6 months ago.  She is here with her husband who offers most of the history as patient is mostly nonverbal.  Interim history: At last visit, patient was still losing weight as she had missing teeth.  She was fitted for dentures at that time. Her eating improved afterwards and since last visit she did not lose as much weight as previously. She does not have other complaints other than increased stress.  Her husband mentions that her high blood sugars at home are almost always related to stress rather than her eating. They forgot the freestyle libre receiver at home so we could not download this today.  Reviewed history: In 2020, husband was telling me that they had to take her insulin away and lock it as she was using too much insulin, even when sugars were only slightly above normal. Her sugars did improve after this measure.  Reviewed HbA1c levels: 12/21/2019: HbA1c 6.1% Lab Results  Component Value Date   HGBA1C 6.4 (A) 08/22/2020   HGBA1C 7.1 (A) 04/20/2020   HGBA1C 6.2 (A) 09/23/2019  07/01/2019: HbA1c 6.9% 09/16/2018: HbA1c 7.1%  She is on: - Metformin 500 mg 2x a day, with meals >> 500 mg with breakfast >> 500 mg twice a day with meals (lunch and dinner) She is using NovoLog as needed 6 units for very high blood sugars now.  She was previously on: - Tresiba 90 +/-20 units at bedtime  >> stopped 05/2019. - Novolog ~10 units 3x a day, before meals  - Jardiance >> yeast inf's.  She checks her sugars more than 4 times a day with her freestyle libre CGM (forgot this at home): - am: 90-120 - 2h after b'fast: 120-160, 208 - lunch: ? - 2h after lunch: 120-150 - dinner: ? - 2h after dinner: up to 180-190 - bedtime: ?  Previously:   Lowest sugar was 30 >> ... 95 >> 70; she has hypoglycemia awareness at 100. Highest sugar was 298 >> 240 >> 220 (stress-related, in the pm) - 2x a week.   Glucometer: [employer] Libre 2, One Touch  Pt's meals are:  - Breakfast: oatmeal - Lunch: cold cut meats + cheese puffs, 3 musketeers bar - Dinner: Different - Snacks: chocolate, juice; also snacks at night Tries to walk for exercise daily.  -No CKD, last BUN/creatinine:  09/21/2020: Glucose 97, BUN/creatinine 19/0.59 Lab Results  Component Value Date   BUN 9 01/31/2020   CREATININE 0.65 01/31/2020  06/24/2019: 7/0.58, glucose 172 On losartan.  -+ HL L; last set of lipids: 09/21/2020: 175/153/51/97 06/24/2019: 207/130/43/138 09/16/2018: 267/404/38/190 No results found for: CHOL, HDL, LDLCALC, LDLDIRECT, TRIG, CHOLHDL Not on pravastatin 20 b/c upset stomach.  Previously on Vascepa - stopped as she had GI sxs from it.  - last eye exam was 01/2020: No DR, + cataract.  -She denies numbness and tingling in her feet.  On her exam from 06/2019: Callused feet, onychomycosis, long nails; but with normal  circulation and response to monofilament.  She continues on Cymbalta.  She started Neurontin since last visit-for mental health.  Latest TSH is normal: 09/21/2020: TSH 0.66 No results found for: TSH 06/24/2019: TSH 1.06  Pt has FH of DM in Meagan, Meagan Moran, Meagan Moran, Meagan Moran.  ROS: Constitutional: + weight loss, + fatigue, no subjective hyperthermia, no subjective hypothermia Eyes: no blurry vision, no xerophthalmia ENT: no sore throat, no nodules palpated in neck, no dysphagia, no  odynophagia, no hoarseness Cardiovascular: no CP/no SOB/no palpitations/no leg swelling Respiratory: no cough/no SOB/no wheezing Gastrointestinal: + N (occasional, stress-related)/no V/no D/no C/no acid reflux Musculoskeletal: no muscle aches/no joint aches Skin: no rashes, no hair loss Neurological: no tremors/no numbness/no tingling/no dizziness  I reviewed pt's medications, allergies, PMH, social hx, family hx, and changes were documented in the history of present illness. Otherwise, unchanged from my initial visit note.  Past Medical History:  Diagnosis Date  . Back pain   . Diabetes mellitus, type II (HCC)   . Heart murmur   . Sleep apnea    Past Surgical History:  Procedure Laterality Date  . CESAREAN SECTION     Social History   Socioeconomic History  . Marital status: Married    Spouse name: Not on file  . Number of children: 2  . Years of education: Not on file  . Highest education level: Not on file  Occupational History  .  Early retirement  Social Needs  . Financial resource strain: Not on file  . Food insecurity    Worry: Not on file    Inability: Not on file  . Transportation needs    Medical: Not on file    Non-medical: Not on file  Tobacco Use  . Smoking status: Current Every Day Smoker  . Smokeless tobacco: Never Used  Substance and Sexual Activity  . Alcohol use:  Once a year  . Drug use: No   Current Outpatient Medications on File Prior to Visit  Medication Sig Dispense Refill  . Acetylcysteine (NAC 600 PO) Take by mouth.    . ALPRAZolam (XANAX) 0.5 MG tablet TAKE 1 TABLET BY MOUTH 3 TIMES A DAY AS NEEDED FOR ANXIETY 90 tablet 0  . amLODipine (NORVASC) 5 MG tablet TAKE 1 TABLET EVERY DAY AT LUNCH    . busPIRone (BUSPAR) 30 MG tablet TAKE 1 TABLET BY MOUTH TWICE A DAY 60 tablet 0  . Continuous Blood Gluc Receiver (FREESTYLE LIBRE 2 READER SYSTM) DEVI 1 each by Does not apply route once for 1 dose. 1 each 0  . Continuous Blood Gluc Sensor  (FREESTYLE LIBRE 2 SENSOR) MISC APPLY EVERY 14 DAYS 6 each 3  . Cyanocobalamin (VITAMIN B12 PO) Take by mouth.    Marland Kitchen glucagon (GLUCAGON EMERGENCY) 1 MG injection Inject 1 mg into the muscle once as needed for up to 1 dose. 1 each 12  . GLUTATHIONE PO Take by mouth.    . hydrochlorothiazide (MICROZIDE) 12.5 MG capsule Take 12.5 mg by mouth daily as needed. (Patient not taking: Reported on 08/22/2020)    . HYDROcodone-acetaminophen (NORCO) 7.5-325 MG tablet     . losartan (COZAAR) 100 MG tablet Take 100 mg by mouth daily.    Marland Kitchen MELATONIN PO Take 20 mg by mouth. Taking 1 tab in the evening and 1 tab before bedtime.    . metFORMIN (GLUCOPHAGE) 500 MG tablet Take 1 tablet (500 mg total) by mouth 2 (two) times daily with a meal. 180 tablet 3  .  Multiple Vitamins-Minerals (ZINC PO) Take by mouth.    . Oxcarbazepine (TRILEPTAL) 300 MG tablet Take 1 tablet (300 mg total) by mouth at bedtime. 90 tablet 0  . pravastatin (PRAVACHOL) 20 MG tablet Take 20 mg by mouth daily. (Patient not taking: Reported on 08/22/2020)    . Probiotic Product (PROBIOTIC PO) Take by mouth.    Marland Kitchen tiZANidine (ZANAFLEX) 4 MG tablet TAKE 1 TABLET 4 TIMES A DAY AS NEEDED FOR MUSCLE SPASMS    . VITAMIN D PO Take by mouth.     No current facility-administered medications on file prior to visit.   Allergies  Allergen Reactions  . Nsaids   . Sulfa Antibiotics    Family History  Problem Relation Age of Onset  . Anxiety disorder Mother   . Anxiety disorder Maternal Grandmother   . Anxiety disorder Daughter   She also has a history of HL and lung cancer in father; heart disease in mother.  PE: BP 110/78 (BP Location: Right Arm, Patient Position: Sitting, Cuff Size: Small)   Pulse (!) 50   Ht 6' (1.829 Meagan)   Wt 146 lb 6.4 oz (66.4 kg)   SpO2 96%   BMI 19.86 kg/Meagan  Wt Readings from Last 3 Encounters:  02/19/21 146 lb 6.4 oz (66.4 kg)  08/22/20 151 lb (68.5 kg)  04/20/20 167 lb (75.8 kg)   Constitutional: normal weight, in  NAD Eyes: PERRLA, EOMI, no exophthalmos ENT: moist mucous membranes, no thyromegaly, no cervical lymphadenopathy Cardiovascular: RRR, No MRG Respiratory: CTA B Gastrointestinal: abdomen soft, NT, ND, BS+ Musculoskeletal: no deformities, strength intact in all 4 Skin: moist, warm, no rashes Neurological: no tremor with outstretched hands, DTR normal in all 4  ASSESSMENT: 1. DM2, insulin-dependent, uncontrolled, without long-term complications, but with hypo and hyperglycemia In 2020, I received a message from Robley Fries, PhD, her counselor, letting me know about the patient's anxiety/depression and the fact that she may overcorrect low blood sugars or eat a lot of sweets to try to prevent blood sugar crashes.  2. HL  3.  Significant weight loss  PLAN:  1. Patient with longstanding, type 2 diabetes, on a submaximal oral antidiabetic regimen with Metformin-500 mg twice a day.  In the past she was on insulin but she was not eating much and lost a significant amount of weight and insulin was stopped.  At last visit HbA1c was excellent, at 6.4%, improved.  At that time, sugars were increasing after lunch but otherwise they were mostly at goal.  I advised her to take Metformin with lunch and dinner but otherwise we did not change her regimen.  I also advised her to scan her CGM more frequently as we had loss of data. - Since then, she had another HbA1c from 2 months ago and this was even better, at 6.1% -At today's visit, we could not download her CGM as they forgot to receive her at home and she is not connected to our clinic.  However, per their report, sugars are at goal at most times of the day, with the exception of the.  When she is more stressed (in the afternoon), which sugars may increase up to 200s.  We discussed why this may happen.  We also discussed that we do not have treatment for this other than trying to keep her as relaxed as possible. -For now, we will continue her Metformin at  the current doses. -Husband would also want to continue her CGM for now. - I suggested  to:  Patient Instructions  Please continue: - Metformin 500 mg with lunch and dinner  Please return in 6 months.   - we checked her HbA1c: 6.4% (higher) - advised to check sugars at different times of the day - 4x a day, rotating check times - advised for yearly eye exams >> she is UTD - return to clinic in 6 months  2. HL -Reviewed latest lipid panel from 09/21/2020: 175/153/51/97-lipids at goal -She stopped pravastatin before last visit because of stomach discomfort.  She is not on a statin.  3.  Significant weight loss -Before last visit, she lost 15 pounds, but previously, 51 pounds.  This is most likely due to the fact that she was not eating well and at last visit she was just fitted for new dentures.  She did start to eat a little better afterwards. -she lost another 5 lbs since then -Metformin is weight neutral.  We do not have her on any medications with weight loss as a side effect  Carlus Pavlov, MD PhD [facility]

## 2021-02-19 NOTE — Patient Instructions (Addendum)
Please continue: - Metformin 500 mg with lunch and dinner  Please return in 4-6 months.

## 2021-05-21 ENCOUNTER — Other Ambulatory Visit: Payer: Self-pay | Admitting: Family Medicine

## 2021-05-21 DIAGNOSIS — R413 Other amnesia: Secondary | ICD-10-CM

## 2021-05-21 DIAGNOSIS — F688 Other specified disorders of adult personality and behavior: Secondary | ICD-10-CM

## 2021-06-07 ENCOUNTER — Ambulatory Visit
Admission: RE | Admit: 2021-06-07 | Discharge: 2021-06-07 | Disposition: A | Discharge status: Home / Self Care | Payer: BC Managed Care – PPO | Source: Ambulatory Visit | Attending: Family Medicine | Admitting: Family Medicine

## 2021-06-07 ENCOUNTER — Other Ambulatory Visit: Payer: Self-pay

## 2021-06-07 DIAGNOSIS — R413 Other amnesia: Secondary | ICD-10-CM

## 2021-06-07 DIAGNOSIS — F688 Other specified disorders of adult personality and behavior: Secondary | ICD-10-CM

## 2021-06-14 ENCOUNTER — Other Ambulatory Visit: Payer: Self-pay

## 2021-06-14 ENCOUNTER — Ambulatory Visit (HOSPITAL_COMMUNITY)
Admission: EM | Admit: 2021-06-14 | Discharge: 2021-06-14 | Disposition: A | Discharge status: Home / Self Care | Payer: BC Managed Care – PPO

## 2021-06-14 DIAGNOSIS — F331 Major depressive disorder, recurrent, moderate: Secondary | ICD-10-CM

## 2021-06-14 NOTE — BH Assessment (Signed)
Pt to [facility] voluntarily with her husband [name]. Pt reported reason for coming to [facility] today is "I was walking on someone else property looking for a cigarette". 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 Pt has been depressed, anxious and "stuck in fight or flight mode". Husband reports that pt was in the neighbors yard looking for cigarette and he received text saying "come get your wife out my yard". Pt stated that she did know she was on neighbors property. Husband reports that he "highly regulates" pt cigarette intake and when she can not get them she walks the neighborhood looking for them. Pt reports having memory issues and confusion which has been present for about year. Pt denies SI, HI, AVH. Pt has outpatient therapy, not sure if she see a psychiatrist.   Pt is routine.

## 2021-06-14 NOTE — ED Notes (Signed)
Patient received After Visit Summary with follow up information with community resources. Education provided for symptom management. Patient received all belongings from [facility] locker, denies SI, HI at time of discharge.

## 2021-06-14 NOTE — Discharge Instructions (Addendum)

## 2021-06-14 NOTE — ED Provider Notes (Addendum)
Behavioral Health Urgent Care Medical Screening Exam  Patient Name: Meagan Moran MRN: [id number] Date of Evaluation: 06/14/21 Chief Complaint:   Diagnosis:  Final diagnoses:  Moderate episode of recurrent major depressive disorder (HCC)    History of Present illness:  Meagan Moran is a 66 y.o. female.,  Presents voluntarily to [facility] behavioral health for walk-in assessment.  She requests that husband, Meagan Moran, remain present during assessment.   Patient is assessed face-to-face by nurse practitioner.  She is seated in assessment area, no acute distress.  She is alert and oriented, pleasant and cooperative during assessment.  She reports depressed mood with congruent affect, tearful at times.  She denies suicidal and homicidal ideations.  She denies any history of suicide attempts, denies history of self-harm.  She contracts verbally for safety with this Clinical research associate.  She has normal speech and, exhibits restless behavior, stands several times during assessment, reports feeling more comfortable standing.  She denies both auditory and visual hallucinations.  Patient is able to converse coherently with goal-directed thoughts and no distractibility or preoccupation.  He denies paranoia.  Objectively there is no evidence of psychosis/mania or delusional thinking.  She states she seeks help today at the encouragement of her husband after she "went to a neighbors looking for cigarettes because I could not find mine and I do not get 1 until 5:00 today."  Patient and her husband have decided that husband will lock cigarettes in a safe and dole them out periodically throughout the day, patient is permitted 10 cigarettes/day.  Per patient and husband this arrangement has gone on for between 2 and 3 months.  Several times during the past 3 months Meagan Moran has gone to homes of neighbors to request cigarettes.  Today, while husband was at the gym, she returned to the home of a neighbor to request cigarettes.   Patient and husband report this has gone on multiple times in fact the police were called to the patient's home approximately 3 weeks ago, police declined to intervene at this time.  Per patient and husband cigarettes are locked in a safe and refrigerator is locked because husband, Meagan Moran, states "she will smoke or eat herself to death if I let her."  Patient's husband, Meagan Moran, states "I cannot control her anymore."  Meagan Moran reports she has been diagnosed with depression approximately 5 years ago.  She is currently seen by outpatient psychiatry through the [facility] clinic in [place], Dr. Ashley Royalty.  She is followed by outpatient counseling through therapist "Meagan Moran in [facility]."  She reports compliance with current medications states medications have been adjusted frequently over the last several months.  Currently she is trending off Paxil and initiating Zoloft, both patient and husband are unable to recall additional medications.  She reports compliance with current medications.  She resides in [place] with her husband of 40 years.  She denies access to weapons.  She is retired.  She endorses decreased sleep and average appetite.  She denies alcohol and substance use.  She reports aside from her husband she is supported by her 2 children who live out of town.  Patient offered support and encouragement.  Patient's husband reports frustration with current outpatient psychiatry provider, discussed CBT and offered additional outpatient psychiatry resources.  Patient's husband reports for the past 2 years she has required prompting to bathe and prepare meals.   Psychiatric Specialty Exam  Presentation  General Appearance:Appropriate for Environment; Casual  Eye Contact:Fair  Speech:Clear and Coherent; Normal Rate  Speech Volume:Decreased  Handedness:Right   Mood and Affect  Mood:Depressed  Affect:Congruent; Depressed   Thought Process  Thought Processes:Coherent; Goal Directed  Descriptions  of Associations:Intact  Orientation:Full (Time, Place and Person)  Thought Content:Logical; WDL    Hallucinations:None  Ideas of Reference:None  Suicidal Thoughts:No  Homicidal Thoughts:No   Sensorium  Memory:Immediate Fair; Recent Fair; Remote Fair  Judgment:Fair  Insight:Fair   Executive Functions  Concentration:Good  Attention Span:Good  Recall:Good  Fund of Knowledge:Good  Language:Good   Psychomotor Activity  Psychomotor Activity:Restlessness   Assets  Assets:Communication Skills; Desire for Improvement; Financial Resources/Insurance; Housing; Intimacy; Leisure Time; Physical Health; Resilience; Social Support   Sleep  Sleep:Fair  Number of hours:  No data recorded  No data recorded  Physical Exam: Physical Exam Vitals and nursing note reviewed.  Constitutional:      Appearance: Normal appearance. She is well-developed and normal weight.  HENT:     Head: Normocephalic and atraumatic.     Nose: Nose normal.  Cardiovascular:     Rate and Rhythm: Normal rate.  Pulmonary:     Effort: Pulmonary effort is normal.  Musculoskeletal:        General: Normal range of motion.     Cervical back: Normal range of motion.  Neurological:     Mental Status: She is alert and oriented to person, place, and time.  Psychiatric:        Attention and Perception: Attention and perception normal.        Mood and Affect: Mood is depressed. Affect is tearful.        Speech: Speech normal.        Behavior: Behavior normal. Behavior is cooperative.        Thought Content: Thought content normal.        Cognition and Memory: Cognition and memory normal.   Review of Systems  Constitutional: Negative.   HENT: Negative.    Eyes: Negative.   Respiratory: Negative.    Cardiovascular: Negative.   Gastrointestinal: Negative.   Genitourinary: Negative.   Musculoskeletal: Negative.   Skin: Negative.   Neurological: Negative.   Endo/Heme/Allergies: Negative.    Psychiatric/Behavioral:  Positive for depression.   Blood pressure 129/73, pulse (!) 52, temperature 98.1 F (36.7 C), temperature source Oral, resp. rate 16, SpO2 98 %. There is no height or weight on file to calculate BMI.  Musculoskeletal: Strength & Muscle Tone: within normal limits Gait & Station: normal Patient leans: N/A   [facility] MSE Discharge Disposition for Follow up and Recommendations: Based on my evaluation the patient does not appear to have an emergency medical condition and can be discharged with resources and follow up care in outpatient services for Medication Management and Individual Therapy Patient reviewed with Dr. Bronwen Betters. Continue current medications. Follow-up with outpatient psychiatry, additional resources provided.  Lenard Lance, FNP 06/14/2021, 4:41 PM

## 2021-07-07 ENCOUNTER — Other Ambulatory Visit: Payer: Self-pay | Admitting: Internal Medicine

## 2021-07-07 DIAGNOSIS — E1165 Type 2 diabetes mellitus with hyperglycemia: Secondary | ICD-10-CM

## 2021-08-08 ENCOUNTER — Telehealth: Payer: Self-pay | Admitting: Internal Medicine

## 2021-08-08 DIAGNOSIS — E1165 Type 2 diabetes mellitus with hyperglycemia: Secondary | ICD-10-CM

## 2021-08-08 DIAGNOSIS — Z794 Long term (current) use of insulin: Secondary | ICD-10-CM

## 2021-08-08 NOTE — Telephone Encounter (Signed)
Pt is needing a refill on her sensors as well as a new meter kit sent in due to it not holding a charge    Continuous Blood Gluc Sensor (FREESTYLE LIBRE 2 SENSOR) MISC   [id number] [name], [place] - [address]

## 2021-08-10 ENCOUNTER — Other Ambulatory Visit: Payer: Self-pay | Admitting: Internal Medicine

## 2021-08-10 DIAGNOSIS — E1165 Type 2 diabetes mellitus with hyperglycemia: Secondary | ICD-10-CM

## 2021-08-10 DIAGNOSIS — Z794 Long term (current) use of insulin: Secondary | ICD-10-CM

## 2021-08-10 MED ORDER — FREESTYLE LIBRE 2 SENSOR MISC: 3 refills | Status: DC

## 2021-08-10 NOTE — Telephone Encounter (Signed)
Rx sent to preferred pharmacy.

## 2021-08-10 NOTE — Telephone Encounter (Signed)
Pt husband calling in voiced that pt is completely out of the sensors below

## 2021-08-11 ENCOUNTER — Other Ambulatory Visit: Payer: Self-pay | Admitting: Internal Medicine

## 2021-08-11 DIAGNOSIS — E1165 Type 2 diabetes mellitus with hyperglycemia: Secondary | ICD-10-CM

## 2021-08-11 DIAGNOSIS — Z794 Long term (current) use of insulin: Secondary | ICD-10-CM

## 2021-08-13 ENCOUNTER — Telehealth: Payer: Self-pay

## 2021-08-13 NOTE — Telephone Encounter (Signed)
2 SENSOR) New insurance added. Per pharmacy sensors not covered by old insurance. Please refill sensors attached to new insurance. [facility] [address]

## 2021-08-22 ENCOUNTER — Encounter: Payer: Self-pay | Admitting: Internal Medicine

## 2021-08-22 ENCOUNTER — Ambulatory Visit: Payer: Medicare PPO | Admitting: Internal Medicine

## 2021-08-22 ENCOUNTER — Other Ambulatory Visit: Payer: Self-pay

## 2021-08-22 VITALS — BP 138/72 | HR 72 | Ht 72.0 in | Wt 150.6 lb

## 2021-08-22 DIAGNOSIS — Z794 Long term (current) use of insulin: Secondary | ICD-10-CM

## 2021-08-22 DIAGNOSIS — E1165 Type 2 diabetes mellitus with hyperglycemia: Secondary | ICD-10-CM

## 2021-08-22 DIAGNOSIS — E782 Mixed hyperlipidemia: Secondary | ICD-10-CM | POA: Diagnosis not present

## 2021-08-22 DIAGNOSIS — R634 Abnormal weight loss: Secondary | ICD-10-CM | POA: Diagnosis not present

## 2021-08-22 LAB — POCT GLYCOSYLATED HEMOGLOBIN (HGB A1C): Hemoglobin A1C: 6.6 % — AB (ref 4.0–5.6)

## 2021-08-22 NOTE — Patient Instructions (Addendum)
Please continue: - Metformin 500 mg with lunch and dinner  Try to stop milk.  Please return in 4-6 months.

## 2021-08-22 NOTE — Progress Notes (Signed)
Patient ID: [name], female   DOB: [birth year], 65 y.o.   MRN: [id number]   This visit occurred during the SARS-CoV-2 public health emergency.  Safety protocols were in place, including screening questions prior to the visit, additional usage of staff PPE, and extensive cleaning of exam room while observing appropriate contact time as indicated for disinfecting solutions.   HPI: Meagan Moran is a 65 y.o.-year-old female, initially referred by her PCP, Meagan Moran, returning for follow-up for DM2, dx in 2005, prev. insulin-dependent, uncontrolled, without long-term complications. Last visit 6 months ago.  She is here with her husband who offers most of the history as patient is mostly nonverbal.  Interim history: Her edema improved after getting better fitting dentures before last visit.  She previously lost a significant amount of weight.  She gained 4 pounds since last visit. + increased urination - on a diuretic. No blurry vision, nausea, chest pain, swelling.  Reviewed history: In 2020, husband was telling me that they had to take her insulin away and lock it as she was using too much insulin, even when sugars were only slightly above normal. Her sugars did improve after this measure.  Reviewed HbA1c levels: Lab Results  Component Value Date   HGBA1C 6.4 (A) 02/19/2021   HGBA1C 6.4 (A) 08/22/2020   HGBA1C 7.1 (A) 04/20/2020   HGBA1C 6.2 (A) 09/23/2019  07/01/2019: HbA1c 6.9% 09/16/2018: HbA1c 7.1%  She is on: - Metformin 500 mg 2x a day, with meals >> 500 mg with breakfast >> 500 mg 2x a day with meals (lunch and dinner) She is using NovoLog as needed 6 units for very high blood sugars now.  She was previously on: - Tresiba 90 +/-20 units at bedtime >> stopped 05/2019. - Novolog ~10 units 3x a day, before meals  - Jardiance >> yeast inf's.  She checks her sugars more than 4 times a day with her freestyle libre CGM - but off for 3-4 weeks.  They are not checking sugars  with a glucometer.  Previously: - am: 90-120  - 2h after b'fast: 120-160, 208 - lunch: ? - 2h after lunch: 120-150 - dinner: ? - 2h after dinner: up to 180-190 - bedtime: ?  Previously:   Lowest sugar was 30 >> ... 95 >> 70 >> 75; she has hypoglycemia awareness at 100. Highest sugar was 298 >> 240 >> 220 (stress-related, in the pm) - 2x a week >> 204 1x a week.   Glucometer: Meagan Moran 2, One Touch  Pt's meals are:  - Breakfast: oatmeal - Lunch: cold cut meats + cheese puffs, 3 musket[employer] bar - Dinner: Different - Snacks: chocolate, juice; also snacks at night Tries to walk for exercise daily.  -No CKD, last BUN/creatinine:  09/21/2020: Glucose 97, BUN/creatinine 19/0.59 Lab Results  Component Value Date   BUN 9 01/31/2020   CREATININE 0.65 01/31/2020  06/24/2019: 7/0.58, glucose 172 On losartan.  -+ HL L; last set of lipids: 09/21/2020: 175/153/51/97 06/24/2019: 207/130/43/138 09/16/2018: 267/404/38/190 No results found for: CHOL, HDL, LDLCALC, LDLDIRECT, TRIG, CHOLHDL Not on pravastatin 20 b/c upset stomach.  Previously on Vascepa - stopped as she had GI sxs from it.  - last eye exam was 01/2020: No DR, + cataract.  -She denies numbness and tingling in her feet.  On her exam from 06/2019: Callused feet, onychomycosis, long nails; but with normal circulation and response to monofilament.  She continues on Cymbalta.  She started Neurontin -for mental health.  Latest  TSH is normal: 09/21/2020: TSH 0.66 No results found for: TSH 06/24/2019: TSH 1.06  Pt has FH of DM in [place], [place], M aunt, M cousins.  ROS: + See HPI  I reviewed pt's medications, allergies, PMH, social hx, family hx, and changes were documented in the history of present illness. Otherwise, unchanged from my initial visit note.  Past Medical History:  Diagnosis Date   Back pain    Diabetes mellitus, type II (HCC)    Heart murmur    Sleep apnea    Past Surgical History:  Procedure  Laterality Date   CESAREAN SECTION     Social History   Socioeconomic History   Marital status: Married    Spouse name: Not on file   Number of children: 2   Years of education: Not on file   Highest education level: Not on file  Occupational History    Early retirement  Social Needs   Financial resource strain: Not on file   Food insecurity    Worry: Not on file    Inability: Not on file   Transportation needs    Medical: Not on file    Non-medical: Not on file  Tobacco Use   Smoking status: Current Every Day Smoker   Smokeless tobacco: Never Used  Substance and Sexual Activity   Alcohol use:  Once a year   Drug use: No   Current Outpatient Medications on File Prior to Visit  Medication Sig Dispense Refill   Acetylcysteine (NAC 600 PO) Take by mouth.     ALPRAZolam (XANAX) 0.5 MG tablet TAKE 1 TABLET BY MOUTH 3 TIMES A DAY AS NEEDED FOR ANXIETY 90 tablet 0   amLODipine (NORVASC) 5 MG tablet TAKE 1 TABLET EVERY DAY AT LUNCH     busPIRone (BUSPAR) 30 MG tablet TAKE 1 TABLET BY MOUTH TWICE A DAY 60 tablet 0   Continuous Blood Gluc Receiver (FREESTYLE LIBRE 2 READER SYSTM) DEVI 1 each by Does not apply route once for 1 dose. 1 each 0   Continuous Blood Gluc Sensor (FREESTYLE LIBRE 2 SENSOR) MISC USE AS INSTRUCTED APPLY EVERY 14 DAYS 6 each 3   Cyanocobalamin (VITAMIN B12 PO) Take by mouth.     gabapentin (NEURONTIN) 100 MG capsule Take 100 mg by mouth every morning.     gabapentin (NEURONTIN) 300 MG capsule Take by mouth.     glucagon (GLUCAGON EMERGENCY) 1 MG injection Inject 1 mg into the muscle once as needed for up to 1 dose. 1 each 12   GLUTATHIONE PO Take by mouth.     hydrochlorothiazide (MICROZIDE) 12.5 MG capsule Take 12.5 mg by mouth daily as needed.     HYDROcodone-acetaminophen (NORCO) 7.5-325 MG tablet      losartan (COZAAR) 100 MG tablet Take 100 mg by mouth daily.     MELATONIN PO Take 20 mg by mouth. Taking 1 tab in the evening and 1 tab before bedtime.      metFORMIN (GLUCOPHAGE) 500 MG tablet TAKE 1 TABLET BY MOUTH TWICE A DAY WITH A MEAL 180 tablet 3   Multiple Vitamins-Minerals (ZINC PO) Take by mouth.     Oxcarbazepine (TRILEPTAL) 300 MG tablet Take 1 tablet (300 mg total) by mouth at bedtime. 90 tablet 0   pravastatin (PRAVACHOL) 20 MG tablet Take 20 mg by mouth daily.     Probiotic Product (PROBIOTIC PO) Take by mouth.     tiZANidine (ZANAFLEX) 4 MG tablet TAKE 1 TABLET 4 TIMES A DAY AS  NEEDED FOR MUSCLE SPASMS     VITAMIN D PO Take by mouth.     No current facility-administered medications on file prior to visit.   Allergies  Allergen Reactions   Nsaids    Sulfa Antibiotics    Family History  Problem Relation Age of Onset   Anxiety disorder Mother    Anxiety disorder Maternal Grandmother    Anxiety disorder Daughter   She also has a history of HL and lung cancer in father; heart disease in mother.  PE: BP 138/72 (BP Location: Right Arm, Patient Position: Sitting, Cuff Size: Normal)   Pulse 72   Ht 6' (1.829 m)   Wt 150 lb 9.6 oz (68.3 kg)   SpO2 96%   BMI 20.43 kg/m  Wt Readings from Last 3 Encounters:  08/22/21 150 lb 9.6 oz (68.3 kg)  02/19/21 146 lb 6.4 oz (66.4 kg)  08/22/20 151 lb (68.5 kg)   Constitutional: normal weight, in NAD Eyes: PERRLA, EOMI, no exophthalmos ENT: moist mucous membranes, no thyromegaly, no cervical lymphadenopathy Cardiovascular: RRR, No MRG Respiratory: CTA B Gastrointestinal: abdomen soft, NT, ND, BS+ Musculoskeletal: no deformities, strength intact in all 4 Skin: moist, warm, no rashes Neurological: no tremor with outstretched hands, DTR normal in all 4  ASSESSMENT: 1. DM2, insulin-dependent, uncontrolled, without long-term complications, but with hypo and hyperglycemia In 2020, I received a message from Robley Fries, PhD, her counselor, letting me know about the patient's anxiety/depression and the fact that she may overcorrect low blood sugars or eat a lot of sweets to try to  prevent blood sugar crashes.  2. HL  3.  Significant weight loss  PLAN:  1. Patient with longstanding, type 2 diabetes, on only metformin - submaximal dose.  In the past, she was on insulin but she was not eating much and lost a significant amount of weight and insulin had to be stopped after overusing it in the past.  HbA1c was only slightly higher at last visit, at 6.4%, increased from 6.1%.  We continued only metformin.  She continues on the CGM.  We could not download this at last visit as they forgot the receiver at home which was not connected to the clinic.  Sugars were at goal most times of the day, with the exception of the times that she was more stressed in the afternoon, in which sugars increased up to 200s. -At this visit, they are telling me that she is off HER CGM as this was not covered by her insurance anymore.  We will try to send a PA for this.  When that she had the CGM attached, her sugars are approximately the same as before, with only occasional spikes in the afternoon when more stressed.  These readings are actually quite rare. -However, she is now drinking approximately 3 gallons of milk per week and we discussed that this is greatly increasing her blood sugars.  I strongly advised him to stop and possibly switching to almond milk, but definitely drinking less.  They will make this change.  For now, I do not see the need to change her regimen and will continue with metformin. -I did advise them that if they cannot obtain the CGM, they can check blood sugars once a day, rotating check times. - I suggested to:  Patient Instructions  Please continue: - Metformin 500 mg with lunch and dinner  Please return in 4-6 months.   - we checked her HbA1c: 6.6% (higher) - advised to check sugars  at different times of the day - 4x a day, rotating check times - advised for yearly eye exams >> she is not UTD - she has an appointment with PCP coming up next month. - return to clinic in  4-6 months  2. HL - Reviewed latest lipid panel from 09/21/2020:175/153/51/97-lipids at goal -She was on pravastatin but stopped because of stomach discomfort.  She is not on a statin now.  She tried Vascepa but she developed GI symptoms and stopped it.  3.  Significant weight loss -She continues to lose weight, lost 5 pounds before last visit, 15 pounds between the prior visit and 51 pounds previously -At last visit, she started to eat better after being fitted for new dentures -Since then she gained 4 pounds. -We will continue metformin which is weight neutral.  Carlus Pavlov, MD PhD [facility]

## 2021-09-04 ENCOUNTER — Telehealth: Payer: Self-pay | Admitting: Internal Medicine

## 2021-09-04 NOTE — Telephone Encounter (Signed)
Pharmacy is currently on lunch until 2, will callback to find out what's needed

## 2021-09-04 NOTE — Telephone Encounter (Signed)
Pt spouse called in to insist that calling [employer] and MD overriding the prescription will allow them to get sensors needed. Pt would rather use the sensors if insurance will cover them with the override. Pt contact [phone]

## 2021-09-04 NOTE — Telephone Encounter (Signed)
Per pharmacy Medicare Part D doesn't cover. Preferred is the Accu-check guide. Vm left for patient to callback to decide if she would like to switch or pay out of pocket

## 2021-09-04 NOTE — Telephone Encounter (Signed)
Meagan Moran husband is here for free style libre sensors sent to [employer]. ins company needs a verbal from MD office to override the prescription. Has been on-going for 65months

## 2021-10-01 NOTE — Telephone Encounter (Signed)
Please update with Libre status.

## 2021-10-01 NOTE — Telephone Encounter (Signed)
Pt calling back in regards to the PA for sensors. Please follow up. Pt contact [phone]

## 2021-10-02 ENCOUNTER — Other Ambulatory Visit (HOSPITAL_COMMUNITY): Payer: Self-pay

## 2021-10-02 NOTE — Telephone Encounter (Signed)
Called and left a detailed message for pt advising Meagan Moran needs to be provided by a DME supplier. They can contact their insurance to find which one they prefer or they can use the list provided in Mychart message. The order will need to be initiated by the patient and all paperwork required will be sent to the office from the DME supplier.

## 2021-10-23 ENCOUNTER — Telehealth: Payer: Self-pay

## 2021-10-23 ENCOUNTER — Encounter: Payer: Self-pay | Admitting: Internal Medicine

## 2021-10-23 NOTE — Telephone Encounter (Signed)
Inbound fax requesting forms be completed and faxed with recent clinical notes. Forms completed and faxed to [employer] at [phone].

## 2021-10-26 ENCOUNTER — Encounter: Payer: Self-pay | Admitting: Internal Medicine

## 2021-10-26 ENCOUNTER — Telehealth: Payer: Self-pay

## 2021-10-26 NOTE — Telephone Encounter (Signed)
(  Dr. Elvera Lennox patient) Call back [phone],  Fax [phone]  Josephine Igo system information requested:   Need "order form"  not the test log faxed  to [phone].

## 2021-10-29 NOTE — Telephone Encounter (Signed)
Form re-faxed to [phone].

## 2021-10-31 NOTE — Telephone Encounter (Signed)
Called and received alternative fax number. Forms faxed to [phone]

## 2021-11-02 DIAGNOSIS — F4312 Post-traumatic stress disorder, chronic: Secondary | ICD-10-CM | POA: Diagnosis not present

## 2021-11-02 DIAGNOSIS — F411 Generalized anxiety disorder: Secondary | ICD-10-CM | POA: Diagnosis not present

## 2021-11-02 DIAGNOSIS — F332 Major depressive disorder, recurrent severe without psychotic features: Secondary | ICD-10-CM | POA: Diagnosis not present

## 2021-11-06 DIAGNOSIS — E1165 Type 2 diabetes mellitus with hyperglycemia: Secondary | ICD-10-CM | POA: Diagnosis not present

## 2021-11-09 DIAGNOSIS — F411 Generalized anxiety disorder: Secondary | ICD-10-CM | POA: Diagnosis not present

## 2021-11-09 DIAGNOSIS — F332 Major depressive disorder, recurrent severe without psychotic features: Secondary | ICD-10-CM | POA: Diagnosis not present

## 2021-11-09 DIAGNOSIS — F4312 Post-traumatic stress disorder, chronic: Secondary | ICD-10-CM | POA: Diagnosis not present

## 2021-11-16 DIAGNOSIS — F332 Major depressive disorder, recurrent severe without psychotic features: Secondary | ICD-10-CM | POA: Diagnosis not present

## 2021-11-16 DIAGNOSIS — F411 Generalized anxiety disorder: Secondary | ICD-10-CM | POA: Diagnosis not present

## 2021-11-16 DIAGNOSIS — F4312 Post-traumatic stress disorder, chronic: Secondary | ICD-10-CM | POA: Diagnosis not present

## 2021-11-21 DIAGNOSIS — F411 Generalized anxiety disorder: Secondary | ICD-10-CM | POA: Diagnosis not present

## 2021-11-21 DIAGNOSIS — F4312 Post-traumatic stress disorder, chronic: Secondary | ICD-10-CM | POA: Diagnosis not present

## 2021-11-21 DIAGNOSIS — F332 Major depressive disorder, recurrent severe without psychotic features: Secondary | ICD-10-CM | POA: Diagnosis not present

## 2021-11-23 DIAGNOSIS — F411 Generalized anxiety disorder: Secondary | ICD-10-CM | POA: Diagnosis not present

## 2021-11-23 DIAGNOSIS — F4312 Post-traumatic stress disorder, chronic: Secondary | ICD-10-CM | POA: Diagnosis not present

## 2021-11-23 DIAGNOSIS — F332 Major depressive disorder, recurrent severe without psychotic features: Secondary | ICD-10-CM | POA: Diagnosis not present

## 2021-11-28 DIAGNOSIS — F411 Generalized anxiety disorder: Secondary | ICD-10-CM | POA: Diagnosis not present

## 2021-11-28 DIAGNOSIS — F4312 Post-traumatic stress disorder, chronic: Secondary | ICD-10-CM | POA: Diagnosis not present

## 2021-11-28 DIAGNOSIS — F332 Major depressive disorder, recurrent severe without psychotic features: Secondary | ICD-10-CM | POA: Diagnosis not present

## 2021-11-30 DIAGNOSIS — F411 Generalized anxiety disorder: Secondary | ICD-10-CM | POA: Diagnosis not present

## 2021-11-30 DIAGNOSIS — F4312 Post-traumatic stress disorder, chronic: Secondary | ICD-10-CM | POA: Diagnosis not present

## 2021-11-30 DIAGNOSIS — F332 Major depressive disorder, recurrent severe without psychotic features: Secondary | ICD-10-CM | POA: Diagnosis not present

## 2021-12-05 DIAGNOSIS — F4312 Post-traumatic stress disorder, chronic: Secondary | ICD-10-CM | POA: Diagnosis not present

## 2021-12-05 DIAGNOSIS — F411 Generalized anxiety disorder: Secondary | ICD-10-CM | POA: Diagnosis not present

## 2021-12-05 DIAGNOSIS — F332 Major depressive disorder, recurrent severe without psychotic features: Secondary | ICD-10-CM | POA: Diagnosis not present

## 2021-12-07 DIAGNOSIS — F411 Generalized anxiety disorder: Secondary | ICD-10-CM | POA: Diagnosis not present

## 2021-12-07 DIAGNOSIS — F4312 Post-traumatic stress disorder, chronic: Secondary | ICD-10-CM | POA: Diagnosis not present

## 2021-12-07 DIAGNOSIS — F332 Major depressive disorder, recurrent severe without psychotic features: Secondary | ICD-10-CM | POA: Diagnosis not present

## 2021-12-12 DIAGNOSIS — F411 Generalized anxiety disorder: Secondary | ICD-10-CM | POA: Diagnosis not present

## 2021-12-12 DIAGNOSIS — F4312 Post-traumatic stress disorder, chronic: Secondary | ICD-10-CM | POA: Diagnosis not present

## 2021-12-12 DIAGNOSIS — F332 Major depressive disorder, recurrent severe without psychotic features: Secondary | ICD-10-CM | POA: Diagnosis not present

## 2021-12-14 DIAGNOSIS — F332 Major depressive disorder, recurrent severe without psychotic features: Secondary | ICD-10-CM | POA: Diagnosis not present

## 2021-12-14 DIAGNOSIS — F4312 Post-traumatic stress disorder, chronic: Secondary | ICD-10-CM | POA: Diagnosis not present

## 2021-12-14 DIAGNOSIS — F411 Generalized anxiety disorder: Secondary | ICD-10-CM | POA: Diagnosis not present

## 2021-12-19 DIAGNOSIS — F411 Generalized anxiety disorder: Secondary | ICD-10-CM | POA: Diagnosis not present

## 2021-12-19 DIAGNOSIS — F332 Major depressive disorder, recurrent severe without psychotic features: Secondary | ICD-10-CM | POA: Diagnosis not present

## 2021-12-19 DIAGNOSIS — F4312 Post-traumatic stress disorder, chronic: Secondary | ICD-10-CM | POA: Diagnosis not present

## 2021-12-25 DIAGNOSIS — F4312 Post-traumatic stress disorder, chronic: Secondary | ICD-10-CM | POA: Diagnosis not present

## 2021-12-25 DIAGNOSIS — F332 Major depressive disorder, recurrent severe without psychotic features: Secondary | ICD-10-CM | POA: Diagnosis not present

## 2021-12-25 DIAGNOSIS — F411 Generalized anxiety disorder: Secondary | ICD-10-CM | POA: Diagnosis not present

## 2022-01-02 DIAGNOSIS — F4312 Post-traumatic stress disorder, chronic: Secondary | ICD-10-CM | POA: Diagnosis not present

## 2022-01-02 DIAGNOSIS — F332 Major depressive disorder, recurrent severe without psychotic features: Secondary | ICD-10-CM | POA: Diagnosis not present

## 2022-01-02 DIAGNOSIS — F411 Generalized anxiety disorder: Secondary | ICD-10-CM | POA: Diagnosis not present

## 2022-01-09 DIAGNOSIS — F332 Major depressive disorder, recurrent severe without psychotic features: Secondary | ICD-10-CM | POA: Diagnosis not present

## 2022-01-09 DIAGNOSIS — F4312 Post-traumatic stress disorder, chronic: Secondary | ICD-10-CM | POA: Diagnosis not present

## 2022-01-09 DIAGNOSIS — F411 Generalized anxiety disorder: Secondary | ICD-10-CM | POA: Diagnosis not present

## 2022-01-16 DIAGNOSIS — F411 Generalized anxiety disorder: Secondary | ICD-10-CM | POA: Diagnosis not present

## 2022-01-16 DIAGNOSIS — F4312 Post-traumatic stress disorder, chronic: Secondary | ICD-10-CM | POA: Diagnosis not present

## 2022-01-16 DIAGNOSIS — F332 Major depressive disorder, recurrent severe without psychotic features: Secondary | ICD-10-CM | POA: Diagnosis not present

## 2022-01-23 DIAGNOSIS — F332 Major depressive disorder, recurrent severe without psychotic features: Secondary | ICD-10-CM | POA: Diagnosis not present

## 2022-01-23 DIAGNOSIS — F411 Generalized anxiety disorder: Secondary | ICD-10-CM | POA: Diagnosis not present

## 2022-01-23 DIAGNOSIS — F4312 Post-traumatic stress disorder, chronic: Secondary | ICD-10-CM | POA: Diagnosis not present

## 2022-01-30 DIAGNOSIS — F4312 Post-traumatic stress disorder, chronic: Secondary | ICD-10-CM | POA: Diagnosis not present

## 2022-01-30 DIAGNOSIS — E1165 Type 2 diabetes mellitus with hyperglycemia: Secondary | ICD-10-CM | POA: Diagnosis not present

## 2022-01-30 DIAGNOSIS — F332 Major depressive disorder, recurrent severe without psychotic features: Secondary | ICD-10-CM | POA: Diagnosis not present

## 2022-01-30 DIAGNOSIS — F411 Generalized anxiety disorder: Secondary | ICD-10-CM | POA: Diagnosis not present

## 2022-02-06 DIAGNOSIS — F4312 Post-traumatic stress disorder, chronic: Secondary | ICD-10-CM | POA: Diagnosis not present

## 2022-02-06 DIAGNOSIS — F332 Major depressive disorder, recurrent severe without psychotic features: Secondary | ICD-10-CM | POA: Diagnosis not present

## 2022-02-06 DIAGNOSIS — F411 Generalized anxiety disorder: Secondary | ICD-10-CM | POA: Diagnosis not present

## 2022-02-13 DIAGNOSIS — F411 Generalized anxiety disorder: Secondary | ICD-10-CM | POA: Diagnosis not present

## 2022-02-13 DIAGNOSIS — F332 Major depressive disorder, recurrent severe without psychotic features: Secondary | ICD-10-CM | POA: Diagnosis not present

## 2022-02-13 DIAGNOSIS — F4312 Post-traumatic stress disorder, chronic: Secondary | ICD-10-CM | POA: Diagnosis not present

## 2022-02-18 DIAGNOSIS — F4323 Adjustment disorder with mixed anxiety and depressed mood: Secondary | ICD-10-CM | POA: Diagnosis not present

## 2022-02-19 ENCOUNTER — Ambulatory Visit: Payer: Medicare PPO | Admitting: Internal Medicine

## 2022-02-20 DIAGNOSIS — F411 Generalized anxiety disorder: Secondary | ICD-10-CM | POA: Diagnosis not present

## 2022-02-20 DIAGNOSIS — F332 Major depressive disorder, recurrent severe without psychotic features: Secondary | ICD-10-CM | POA: Diagnosis not present

## 2022-02-20 DIAGNOSIS — F4312 Post-traumatic stress disorder, chronic: Secondary | ICD-10-CM | POA: Diagnosis not present

## 2022-02-27 DIAGNOSIS — F332 Major depressive disorder, recurrent severe without psychotic features: Secondary | ICD-10-CM | POA: Diagnosis not present

## 2022-02-27 DIAGNOSIS — F4312 Post-traumatic stress disorder, chronic: Secondary | ICD-10-CM | POA: Diagnosis not present

## 2022-02-27 DIAGNOSIS — F411 Generalized anxiety disorder: Secondary | ICD-10-CM | POA: Diagnosis not present

## 2022-03-04 DIAGNOSIS — F4323 Adjustment disorder with mixed anxiety and depressed mood: Secondary | ICD-10-CM | POA: Diagnosis not present

## 2022-03-04 NOTE — Progress Notes (Signed)
? ? Meagan Moran ?[facility] ?[place] ?Phone: [phone] ?  ?Assessment and Plan:   ?  ?1. Left arm pain ?2. Humeral head fracture, left, closed, initial encounter ?3. Elbow fracture, left, closed, initial encounter ?- Acute, complicated, initial sports medicine visit ?- Of note, concerning that patient has had what appears to be multiple fractures and is only now being seen for the first time, 1 week after fall.  Patient's husband was present throughout clinic visit and helps provide HPI. ?-X-rays obtained in clinic.  My interpretation: Acute, closed fracture of humeral head, displaced.  Cortical thickening of mid humeral shaft with history of humeral shaft fracture, likely consistent with chronic changes, though cannot rule out nondisplaced fracture.  Chronic appearing cortical changes of elbow, with concern for acute fracture of the distal humerus based on edema.  Will await for official radiology reads. ?- Recommend urgent referral to orthopedic surgery for review of images, and to discuss surgical intervention ?- Patient neurovascularly intact in clinic today, so urgent referral not necessary.  Educated patient and her husband that if patient were to have decreased sensation, decreased strength, loss of pulse, color changes to hand, that this would require emergent evaluation at the emergency room.  They verbalized understanding ?- Discontinue aspirin ?- Start Tylenol 1000 mg 3 times daily for pain relief.  If this is not sufficient to control pain, patient can reach back out to our clinic and we can prescribe a short-term narcotic ?- Nonweightbearing status with left upper extremity.  Advised to be in sling at all times ?- We will order MRIs of shoulder, humerus, elbow for further evaluation and detail of injuries ?- DG Shoulder Left; Future ?- DG Humerus Left; Future ?- DG ELBOW COMPLETE LEFT (3+VIEW); Future ?- DG Forearm Left; Future ?- MR  ELBOW LEFT WO CONTRAST; Future ?- MR SHOULDER LEFT WO CONTRAST; Future ?- MR HUMERUS LEFT WO CONTRAST; Future ?- Ambulatory referral to Orthopedic Surgery ? ?Pertinent previous records reviewed include none ?  ?Follow Up: As needed ?  ?Subjective:   ?I, Meagan Moran, am serving as a Education administrator for [employer] ? ?Chief Complaint: left arm injury  ? ?HPI:  ?03/05/2022 ?Patient is a 66 year old female complaining of left arm pain. Patient states that she thinks she broke her arm she fell , last Tuesday on concrete , the whole arm is in pain , no numbness tingling , pain radiates the whole arm, has been taking Asprin that doesn't seem to be helping , has been in sling since Thursday  ? ?Relevant Historical Information: DM type II ? ?Additional pertinent review of systems negative. ? ? ?Current Outpatient Medications:  ?  Acetylcysteine (NAC 600 PO), Take by mouth., Disp: , Rfl:  ?  ALPRAZolam (XANAX) 0.5 MG tablet, TAKE 1 TABLET BY MOUTH 3 TIMES A DAY AS NEEDED FOR ANXIETY, Disp: 90 tablet, Rfl: 0 ?  amLODipine (NORVASC) 5 MG tablet, TAKE 1 TABLET EVERY DAY AT LUNCH, Disp: , Rfl:  ?  busPIRone (BUSPAR) 30 MG tablet, TAKE 1 TABLET BY MOUTH TWICE A DAY, Disp: 60 tablet, Rfl: 0 ?  Continuous Blood Gluc Sensor (FREESTYLE LIBRE 2 SENSOR) MISC, USE AS INSTRUCTED APPLY EVERY 14 DAYS, Disp: 6 each, Rfl: 3 ?  Cyanocobalamin (VITAMIN B12 PO), Take by mouth., Disp: , Rfl:  ?  gabapentin (NEURONTIN) 100 MG capsule, Take 100 mg by mouth every morning., Disp: , Rfl:  ?  gabapentin (NEURONTIN)  300 MG capsule, Take by mouth., Disp: , Rfl:  ?  glucagon (GLUCAGON EMERGENCY) 1 MG injection, Inject 1 mg into the muscle once as needed for up to 1 dose., Disp: 1 each, Rfl: 12 ?  GLUTATHIONE PO, Take by mouth., Disp: , Rfl:  ?  hydrochlorothiazide (MICROZIDE) 12.5 MG capsule, Take 12.5 mg by mouth daily as needed., Disp: , Rfl:  ?  HYDROcodone-acetaminophen (NORCO) 7.5-325 MG tablet, , Disp: , Rfl:  ?  losartan (COZAAR) 100 MG  tablet, Take 100 mg by mouth daily., Disp: , Rfl:  ?  MELATONIN PO, Take 20 mg by mouth. Taking 1 tab in the evening and 1 tab before bedtime., Disp: , Rfl:  ?  metFORMIN (GLUCOPHAGE) 500 MG tablet, TAKE 1 TABLET BY MOUTH TWICE A DAY WITH A MEAL, Disp: 180 tablet, Rfl: 3 ?  Multiple Vitamins-Minerals (ZINC PO), Take by mouth., Disp: , Rfl:  ?  pravastatin (PRAVACHOL) 20 MG tablet, Take 20 mg by mouth daily., Disp: , Rfl:  ?  Probiotic Product (PROBIOTIC PO), Take by mouth., Disp: , Rfl:  ?  tiZANidine (ZANAFLEX) 4 MG tablet, TAKE 1 TABLET 4 TIMES A DAY AS NEEDED FOR MUSCLE SPASMS, Disp: , Rfl:  ?  VITAMIN D PO, Take by mouth., Disp: , Rfl:  ?  Continuous Blood Gluc Receiver (FREESTYLE LIBRE 2 READER SYSTM) DEVI, 1 each by Does not apply route once for 1 dose., Disp: 1 each, Rfl: 0 ?  Oxcarbazepine (TRILEPTAL) 300 MG tablet, Take 1 tablet (300 mg total) by mouth at bedtime., Disp: 90 tablet, Rfl: 0  ? ?Objective:   ?  ?Vitals:  ? 03/05/22 1443  ?BP: 130/70  ?Pulse: 75  ?SpO2: 99%  ?Weight: 150 lb (68 kg)  ?Height: 6' (1.829 m)  ?  ?  ?Body mass index is 20.34 kg/m?.  ?  ?Physical Exam:   ? ?Gen: Appears well, nad, nontoxic and pleasant ?Psych: A&O, appropriate mood and affect ? ?Left shoulder/elbow: no gross deformity.  No open lesion ?Ecchymosis present throughout shoulder, forearm, elbow in varying degrees of healing ?Range of motion severely limited in shoulder and elbow, with significant pain with any degree of shoulder motion or elbow motion ?Neuro:sensation intact, strength is 5/5 with grip strength ?Radial pulse present ? ?Electronically signed by:  ?Meagan Moran ?[facility] ?4:19 PM 03/05/22 ?

## 2022-03-05 ENCOUNTER — Ambulatory Visit: Payer: Medicare PPO | Admitting: Sports Medicine

## 2022-03-05 ENCOUNTER — Ambulatory Visit (INDEPENDENT_AMBULATORY_CARE_PROVIDER_SITE_OTHER): Payer: Medicare PPO

## 2022-03-05 VITALS — BP 130/70 | HR 75 | Ht 72.0 in | Wt 150.0 lb

## 2022-03-05 DIAGNOSIS — S42352A Displaced comminuted fracture of shaft of humerus, left arm, initial encounter for closed fracture: Secondary | ICD-10-CM | POA: Diagnosis not present

## 2022-03-05 DIAGNOSIS — M25522 Pain in left elbow: Secondary | ICD-10-CM | POA: Diagnosis not present

## 2022-03-05 DIAGNOSIS — M7989 Other specified soft tissue disorders: Secondary | ICD-10-CM | POA: Diagnosis not present

## 2022-03-05 DIAGNOSIS — M79632 Pain in left forearm: Secondary | ICD-10-CM | POA: Diagnosis not present

## 2022-03-05 DIAGNOSIS — S42402A Unspecified fracture of lower end of left humerus, initial encounter for closed fracture: Secondary | ICD-10-CM | POA: Diagnosis not present

## 2022-03-05 DIAGNOSIS — S42292A Other displaced fracture of upper end of left humerus, initial encounter for closed fracture: Secondary | ICD-10-CM | POA: Diagnosis not present

## 2022-03-05 DIAGNOSIS — M79602 Pain in left arm: Secondary | ICD-10-CM

## 2022-03-05 DIAGNOSIS — S42212A Unspecified displaced fracture of surgical neck of left humerus, initial encounter for closed fracture: Secondary | ICD-10-CM | POA: Diagnosis not present

## 2022-03-05 NOTE — Patient Instructions (Addendum)
Good to se you  ?MRI referral shoulder humerus, elbow  ?Referral to Orthopedic surgery shoulder and elbow fracture   ?Tylenol 1000 mg 3 times a day for pain relief  ?Stop Asprin  ?Use sling at all times  ?Non weightbearing with that arm  ?Contact us if the tylenol is not sufficient for pain and we can prescribe other medications ? ?

## 2022-03-06 DIAGNOSIS — F332 Major depressive disorder, recurrent severe without psychotic features: Secondary | ICD-10-CM | POA: Diagnosis not present

## 2022-03-06 DIAGNOSIS — F411 Generalized anxiety disorder: Secondary | ICD-10-CM | POA: Diagnosis not present

## 2022-03-06 DIAGNOSIS — F4312 Post-traumatic stress disorder, chronic: Secondary | ICD-10-CM | POA: Diagnosis not present

## 2022-03-12 ENCOUNTER — Ambulatory Visit (INDEPENDENT_AMBULATORY_CARE_PROVIDER_SITE_OTHER): Payer: Medicare PPO

## 2022-03-12 ENCOUNTER — Encounter: Payer: Self-pay | Admitting: Sports Medicine

## 2022-03-12 DIAGNOSIS — M25512 Pain in left shoulder: Secondary | ICD-10-CM | POA: Diagnosis not present

## 2022-03-12 DIAGNOSIS — S42212A Unspecified displaced fracture of surgical neck of left humerus, initial encounter for closed fracture: Secondary | ICD-10-CM | POA: Diagnosis not present

## 2022-03-12 DIAGNOSIS — M7989 Other specified soft tissue disorders: Secondary | ICD-10-CM | POA: Diagnosis not present

## 2022-03-12 DIAGNOSIS — M79602 Pain in left arm: Secondary | ICD-10-CM

## 2022-03-12 DIAGNOSIS — S42415A Nondisplaced simple supracondylar fracture without intercondylar fracture of left humerus, initial encounter for closed fracture: Secondary | ICD-10-CM | POA: Diagnosis not present

## 2022-03-12 DIAGNOSIS — S42352A Displaced comminuted fracture of shaft of humerus, left arm, initial encounter for closed fracture: Secondary | ICD-10-CM | POA: Diagnosis not present

## 2022-03-12 DIAGNOSIS — W19XXXA Unspecified fall, initial encounter: Secondary | ICD-10-CM | POA: Diagnosis not present

## 2022-03-12 DIAGNOSIS — R531 Weakness: Secondary | ICD-10-CM | POA: Diagnosis not present

## 2022-03-12 DIAGNOSIS — M25522 Pain in left elbow: Secondary | ICD-10-CM

## 2022-03-13 ENCOUNTER — Other Ambulatory Visit: Payer: Self-pay | Admitting: Sports Medicine

## 2022-03-13 DIAGNOSIS — S42292A Other displaced fracture of upper end of left humerus, initial encounter for closed fracture: Secondary | ICD-10-CM

## 2022-03-13 DIAGNOSIS — S42402A Unspecified fracture of lower end of left humerus, initial encounter for closed fracture: Secondary | ICD-10-CM

## 2022-03-13 MED ORDER — HYDROCODONE-ACETAMINOPHEN 7.5-325 MG PO TABS: 1.0000 | ORAL_TABLET | Freq: Four times a day (QID) | ORAL | 0 refills | Status: DC | PRN

## 2022-03-18 DIAGNOSIS — F4323 Adjustment disorder with mixed anxiety and depressed mood: Secondary | ICD-10-CM | POA: Diagnosis not present

## 2022-03-20 ENCOUNTER — Encounter: Payer: Self-pay | Admitting: Orthopaedic Surgery

## 2022-03-20 ENCOUNTER — Ambulatory Visit: Payer: Medicare PPO | Admitting: Orthopaedic Surgery

## 2022-03-20 DIAGNOSIS — S42402A Unspecified fracture of lower end of left humerus, initial encounter for closed fracture: Secondary | ICD-10-CM | POA: Diagnosis not present

## 2022-03-20 DIAGNOSIS — S42292A Other displaced fracture of upper end of left humerus, initial encounter for closed fracture: Secondary | ICD-10-CM

## 2022-03-20 MED ORDER — HYDROCODONE-ACETAMINOPHEN 7.5-325 MG PO TABS: 1.0000 | ORAL_TABLET | Freq: Four times a day (QID) | ORAL | 0 refills | Status: AC | PRN

## 2022-03-20 NOTE — Progress Notes (Signed)
The patient is a 66 year old female who is here with her son today.  She is right-hand dominant.  About 4 to 5 weeks ago she had a mechanical fall in her driveway.  She sustained a left proximal humerus fracture and a left elbow fracture.  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  She has been in a sling appropriately.  And still having some moderate pain.  She was sent for MRIs of the left shoulder and the left elbow and those are on the system for me to review as well. ? ?Her left shoulder does move fluidly in terms of internal ex rotation with only some mild pain.  The left elbow has limited flexion extension.  There is nonsignificant pain. ? ?I did review the x-rays and MRI of both the left shoulder and left elbow.  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  The left shoulder has a minimally displaced proximal humerus fracture.  The shoulder is well located. ? ?At this point I will have her come out of her sling and work on wall crawls and gently getting her shoulder and elbow moving.  I would like to see her back in 3 weeks.  At that visit I would like 3 views of her left shoulder and 2 views of her left elbow.  Hopefully then we can potentially get her into physical therapy.  I will refill her hydrocodone but wanted to try to use this sparingly and wean as appropriate.  All questions and concerns were answered and addressed. ?

## 2022-03-21 ENCOUNTER — Ambulatory Visit: Payer: Medicare PPO | Admitting: Internal Medicine

## 2022-03-22 DIAGNOSIS — F332 Major depressive disorder, recurrent severe without psychotic features: Secondary | ICD-10-CM | POA: Diagnosis not present

## 2022-03-22 DIAGNOSIS — F411 Generalized anxiety disorder: Secondary | ICD-10-CM | POA: Diagnosis not present

## 2022-03-22 DIAGNOSIS — F4312 Post-traumatic stress disorder, chronic: Secondary | ICD-10-CM | POA: Diagnosis not present

## 2022-03-27 DIAGNOSIS — F332 Major depressive disorder, recurrent severe without psychotic features: Secondary | ICD-10-CM | POA: Diagnosis not present

## 2022-03-27 DIAGNOSIS — F411 Generalized anxiety disorder: Secondary | ICD-10-CM | POA: Diagnosis not present

## 2022-03-27 DIAGNOSIS — F4312 Post-traumatic stress disorder, chronic: Secondary | ICD-10-CM | POA: Diagnosis not present

## 2022-04-03 DIAGNOSIS — F4312 Post-traumatic stress disorder, chronic: Secondary | ICD-10-CM | POA: Diagnosis not present

## 2022-04-03 DIAGNOSIS — F411 Generalized anxiety disorder: Secondary | ICD-10-CM | POA: Diagnosis not present

## 2022-04-03 DIAGNOSIS — F332 Major depressive disorder, recurrent severe without psychotic features: Secondary | ICD-10-CM | POA: Diagnosis not present

## 2022-04-05 ENCOUNTER — Encounter: Payer: Self-pay | Admitting: Internal Medicine

## 2022-04-05 ENCOUNTER — Ambulatory Visit: Payer: Medicare PPO | Admitting: Internal Medicine

## 2022-04-05 DIAGNOSIS — R634 Abnormal weight loss: Secondary | ICD-10-CM | POA: Diagnosis not present

## 2022-04-05 DIAGNOSIS — Z794 Long term (current) use of insulin: Secondary | ICD-10-CM

## 2022-04-05 DIAGNOSIS — E782 Mixed hyperlipidemia: Secondary | ICD-10-CM

## 2022-04-05 DIAGNOSIS — E1165 Type 2 diabetes mellitus with hyperglycemia: Secondary | ICD-10-CM

## 2022-04-05 LAB — LIPID PANEL
Cholesterol: 207 mg/dL — ABNORMAL HIGH (ref 0–200)
HDL: 62.7 mg/dL (ref >39.00)
LDL Cholesterol: 120 mg/dL — ABNORMAL HIGH (ref 0–99)
NonHDL: 144.41
Total CHOL/HDL Ratio: 3
Triglycerides: 124 mg/dL (ref 0.0–149.0)
VLDL: 24.8 mg/dL (ref 0.0–40.0)

## 2022-04-05 LAB — POCT GLYCOSYLATED HEMOGLOBIN (HGB A1C): Hemoglobin A1C: 6.7 % — AB (ref 4.0–5.6)

## 2022-04-05 NOTE — Progress Notes (Signed)
Patient ID: Meagan Moran, female   DOB: [birth year], 66 y.o.   MRN: [id number]  ? ?This visit occurred during the SARS-CoV-2 public health emergency.  Safety protocols were in place, including screening questions prior to the visit, additional usage of staff PPE, and extensive cleaning of exam room while observing appropriate contact time as indicated for disinfecting solutions.  ? ?HPI: ?Meagan Moran is a 66 y.o.-year-old female, initially referred by her PCP, Dr. Merri Brunette, returning for follow-up for DM2, dx in 2005, prev. insulin-dependent, uncontrolled, without long-term complications. Last visit 8 months ago.  She is here with her husband who offers most of the history as patient is mostly nonverbal. ? ?Interim history: ?Her edema improved after getting better fitting dentures last year.  She previously lost a significant amount of weight. ?+ increased urination - on a diuretic. This improved. ?No blurry vision, nausea, chest pain, swelling. ?She had a left humeral and left elbow fracture after a fall in the driveway at the beginning of last month.  ? ?Reviewed history: ?In 2020, husband was telling me that they had to take her insulin away and lock it as she was using too much insulin, even when sugars were only slightly above normal. Her sugars did improve after this measure. ? ?Reviewed HbA1c levels: ?Lab Results  ?Component Value Date  ? HGBA1C 6.6 (A) 08/22/2021  ? HGBA1C 6.4 (A) 02/19/2021  ? HGBA1C 6.4 (A) 08/22/2020  ? HGBA1C 7.1 (A) 04/20/2020  ? HGBA1C 6.2 (A) 09/23/2019  ?07/01/2019: HbA1c 6.9% ?09/16/2018: HbA1c 7.1% ? ?She is on: ?- Metformin 500 mg 2x a day, with meals >> 500 mg with breakfast >> 500 mg 2x a day with meals (lunch and dinner) ?She is using NovoLog as needed 6 units for very high blood sugars now. ? ?She was previously on: ?- Tresiba 90 +/-20 units at bedtime >> stopped 05/2019. ?- Novolog ~10 units 3x a day, before meals  ?- Jardiance >> yeast inf's. ? ?She checks her sugars  more than 4 times a day with her freestyle libre CGM: ? ? ?Previously: ?- am: 90-120  ?- 2h after b'fast: 120-160, 208 ?- lunch: ? ?- 2h after lunch: 120-150 ?- dinner: ? ?- 2h after dinner: up to 180-190 ?- bedtime: ? ? ?Previously: ? ? ?Lowest sugar was 30 >> ... 95 >> 70 >> 75 >> 100; she has hypoglycemia awareness at 100. ?Highest sugar was 298 >> 240 >> 220 (stress-related, in the pm) - 2x a week >> 204 1x a week >> 221 ? ? ?Glucometer: [employer] 2, One Touch ? ?Pt's meals are:  ?- Breakfast: oatmeal ?- Lunch: cold cut meats + cheese puffs, [employer] bar ?- Dinner: Different ?- Snacks: chocolate, juice; also snacks at night ?Tries to walk for exercise daily. ? ?-No CKD, last BUN/creatinine:  ?08/29/2021: Glucose 126, BUN/creatinine 22/0.63, GFR 98 ?09/21/2020: Glucose 97, BUN/creatinine 19/0.59 ?Lab Results  ?Component Value Date  ? BUN 9 01/31/2020  ? CREATININE 0.65 01/31/2020  ?06/24/2019: 7/0.58, glucose 172 ?On losartan. ? ?-+ HL L; last set of lipids: ?09/21/2020: 175/153/51/97 ?06/24/2019: 207/130/43/138 ?09/16/2018: 267/404/38/190 ?No results found for: CHOL, HDL, LDLCALC, LDLDIRECT, TRIG, CHOLHDL ?Not on pravastatin 20 b/c upset stomach.  Previously on Vascepa - stopped as she had GI sxs from it. ? ?- last eye exam was 01/2020: No DR, + cataract. Scheduled for June. ? ?-She denies numbness and tingling in her feet.  On her exam from 06/2019: Callused feet, onychomycosis, long nails; but  with normal circulation and response to monofilament.  His foot exam was done by PCP in 08/2021 reportedly.  ?She continues on Cymbalta.  She started Neurontin -for mental health. ? ?Latest TSH is normal: ?09/21/2020: TSH 0.66 ?No results found for: TSH ?06/24/2019: TSH 1.06 ? ?[place], [place] aunt, [place] cousins. ? ?ROS: ?+ See HPI ? ?I reviewed pt's medications, allergies, PMH, social hx, family hx, and changes were documented in the history of present illness. Otherwise, unchanged from my initial visit  note. ? ?Past Medical History:  ?Diagnosis Date  ? Back pain   ? Diabetes mellitus, type II (HCC)   ? Heart murmur   ? Sleep apnea   ? ?Past Surgical History:  ?Procedure Laterality Date  ? CESAREAN SECTION    ? ?Social History  ? ?Socioeconomic History  ? Marital status: Married  ?  Spouse name: Not on file  ? Number of children: 2  ? Years of education: Not on file  ? Highest education level: Not on file  ?Occupational History  ?  Early retirement  ?Social Needs  ? Financial resource strain: Not on file  ? Food insecurity  ?  Worry: Not on file  ?  Inability: Not on file  ? Transportation needs  ?  Medical: Not on file  ?  Non-medical: Not on file  ?Tobacco Use  ? Smoking status: Current Every Day Smoker  ? Smokeless tobacco: Never Used  ?Substance and Sexual Activity  ? Alcohol use:  Once a year  ? Drug use: No  ? ?Current Outpatient Medications on File Prior to Visit  ?Medication Sig Dispense Refill  ? Acetylcysteine (NAC 600 PO) Take by mouth.    ? ALPRAZolam (XANAX) 0.5 MG tablet TAKE 1 TABLET BY MOUTH 3 TIMES A DAY AS NEEDED FOR ANXIETY 90 tablet 0  ? amLODipine (NORVASC) 5 MG tablet TAKE 1 TABLET EVERY DAY AT LUNCH    ? busPIRone (BUSPAR) 30 MG tablet TAKE 1 TABLET BY MOUTH TWICE A DAY 60 tablet 0  ? Continuous Blood Gluc Receiver (FREESTYLE LIBRE 2 READER SYSTM) DEVI 1 each by Does not apply route once for 1 dose. 1 each 0  ? Continuous Blood Gluc Sensor (FREESTYLE LIBRE 2 SENSOR) MISC USE AS INSTRUCTED APPLY EVERY 14 DAYS 6 each 3  ? Cyanocobalamin (VITAMIN B12 PO) Take by mouth.    ? gabapentin (NEURONTIN) 100 MG capsule Take 100 mg by mouth every morning.    ? gabapentin (NEURONTIN) 300 MG capsule Take by mouth.    ? glucagon (GLUCAGON EMERGENCY) 1 MG injection Inject 1 mg into the muscle once as needed for up to 1 dose. 1 each 12  ? GLUTATHIONE PO Take by mouth.    ? hydrochlorothiazide (MICROZIDE) 12.5 MG capsule Take 12.5 mg by mouth daily as needed.    ? losartan (COZAAR) 100 MG tablet Take 100 mg by  mouth daily.    ? MELATONIN PO Take 20 mg by mouth. Taking 1 tab in the evening and 1 tab before bedtime.    ? metFORMIN (GLUCOPHAGE) 500 MG tablet TAKE 1 TABLET BY MOUTH TWICE A DAY WITH A MEAL 180 tablet 3  ? Multiple Vitamins-Minerals (ZINC PO) Take by mouth.    ? Oxcarbazepine (TRILEPTAL) 300 MG tablet Take 1 tablet (300 mg total) by mouth at bedtime. 90 tablet 0  ? pravastatin (PRAVACHOL) 20 MG tablet Take 20 mg by mouth daily.    ? Probiotic Product (  PROBIOTIC PO) Take by mouth.    ? tiZANidine (ZANAFLEX) 4 MG tablet TAKE 1 TABLET 4 TIMES A DAY AS NEEDED FOR MUSCLE SPASMS    ? VITAMIN D PO Take by mouth.    ? ?No current facility-administered medications on file prior to visit.  ? ?Allergies  ?Allergen Reactions  ? Nsaids   ? Sulfa Antibiotics   ? ?Family History  ?Problem Relation Age of Onset  ? Anxiety disorder Mother   ? Anxiety disorder Maternal Grandmother   ? Anxiety disorder Daughter   ?She also has a history of HL and lung cancer in father; heart disease in mother. ? ?PE: ?There were no vitals taken for this visit. ?Wt Readings from Last 3 Encounters:  ?03/05/22 150 lb (68 kg)  ?08/22/21 150 lb 9.6 oz (68.3 kg)  ?02/19/21 146 lb 6.4 oz (66.4 kg)  ? ?Constitutional: normal weight, in NAD ?Eyes: PERRLA, EOMI, no exophthalmos ?ENT: moist mucous membranes, no thyromegaly, no cervical lymphadenopathy ?Cardiovascular: RRR, No MRG ?Respiratory: CTA B ?Musculoskeletal: no deformities, strength intact in all 4 ?Skin: moist, warm, no rashes ?Neurological: no tremor with outstretched hands, DTR normal in all 4 ? ?ASSESSMENT: ?1. DM2, insulin-dependent, uncontrolled, without long-term complications, but with hypo and hyperglycemia ?In 2020, I received a message from Robley Fries, PhD, her counselor, letting me know about the patient's anxiety/depression and the fact that she may overcorrect low blood sugars or eat a lot of sweets to try to prevent blood sugar crashes. ? ?2. HL ? ?3.  Significant weight  loss ? ?PLAN:  ?1. Patient with longstanding, type 2 diabetes, on low-dose metformin only.  In the past she was on insulin but she was not eating much and lost a significant amount of weight and insulin had

## 2022-04-05 NOTE — Patient Instructions (Signed)
Please continue: ?- Metformin 500 mg with lunch and dinner ? ?Continue to try to stop milk. ? ?Please return in 6 months.  ?

## 2022-04-08 ENCOUNTER — Ambulatory Visit (INDEPENDENT_AMBULATORY_CARE_PROVIDER_SITE_OTHER): Payer: Medicare PPO

## 2022-04-08 ENCOUNTER — Ambulatory Visit: Payer: Medicare PPO | Admitting: Orthopaedic Surgery

## 2022-04-08 ENCOUNTER — Encounter: Payer: Self-pay | Admitting: Orthopaedic Surgery

## 2022-04-08 ENCOUNTER — Other Ambulatory Visit: Payer: Self-pay

## 2022-04-08 DIAGNOSIS — S42402A Unspecified fracture of lower end of left humerus, initial encounter for closed fracture: Secondary | ICD-10-CM

## 2022-04-08 DIAGNOSIS — S42292A Other displaced fracture of upper end of left humerus, initial encounter for closed fracture: Secondary | ICD-10-CM

## 2022-04-08 NOTE — Progress Notes (Signed)
The patient is now about 8 weeks out from mechanical fall in which she sustained a proximal humerus fracture on the left side and elbow fracture on the left side.  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  She does still have to take the pain medication on a daily basis but only maybe once a day.  She reports that she is doing much better overall and we have had her out of her sling.  Family is with her today as well. ? ?I can easily abduct her shoulder on the left side to past 90 degrees and she can hold it there.  I gently put her through internal and external rotation and it does move as a unit with only some mild pain.  I can flex and extend her elbow but she lacks terminal extension by at least 10 to 15 degrees and her flexion is much better.  Her pain is minimal at the elbow.  I think the extension deficit was there preinjury and it relates to her old injury as a child. ? ?3 views of the left shoulder show the shoulder is well located at the glenohumeral joint.  There is an impacted fracture of the humeral shaft into the humeral head which is a 2 part fracture.  There has been interval healing.  2 views of the left elbow shows severe posttraumatic arthritic changes but no obvious fracture at this standpoint. ? ?We will send her to outpatient physical therapy upstairs here.  The family is requesting Victorino Dike.  She can work on gentle range of motion of the shoulder and the elbow on the left side as well as some light strengthening activities.  I will reevaluate her in 4 weeks.  We do not need x-rays of the elbow at that visit.  I would like a single AP view of the left shoulder at that visit. ?

## 2022-04-10 DIAGNOSIS — F411 Generalized anxiety disorder: Secondary | ICD-10-CM | POA: Diagnosis not present

## 2022-04-10 DIAGNOSIS — F4312 Post-traumatic stress disorder, chronic: Secondary | ICD-10-CM | POA: Diagnosis not present

## 2022-04-10 DIAGNOSIS — F332 Major depressive disorder, recurrent severe without psychotic features: Secondary | ICD-10-CM | POA: Diagnosis not present

## 2022-04-17 DIAGNOSIS — F4312 Post-traumatic stress disorder, chronic: Secondary | ICD-10-CM | POA: Diagnosis not present

## 2022-04-17 DIAGNOSIS — F332 Major depressive disorder, recurrent severe without psychotic features: Secondary | ICD-10-CM | POA: Diagnosis not present

## 2022-04-17 DIAGNOSIS — F411 Generalized anxiety disorder: Secondary | ICD-10-CM | POA: Diagnosis not present

## 2022-04-24 DIAGNOSIS — F4312 Post-traumatic stress disorder, chronic: Secondary | ICD-10-CM | POA: Diagnosis not present

## 2022-04-24 DIAGNOSIS — F332 Major depressive disorder, recurrent severe without psychotic features: Secondary | ICD-10-CM | POA: Diagnosis not present

## 2022-04-24 DIAGNOSIS — F411 Generalized anxiety disorder: Secondary | ICD-10-CM | POA: Diagnosis not present

## 2022-04-30 ENCOUNTER — Encounter: Payer: Self-pay | Admitting: Physical Therapy

## 2022-04-30 ENCOUNTER — Ambulatory Visit: Payer: Medicare PPO | Admitting: Physical Therapy

## 2022-04-30 DIAGNOSIS — M25512 Pain in left shoulder: Secondary | ICD-10-CM | POA: Diagnosis not present

## 2022-04-30 DIAGNOSIS — M25612 Stiffness of left shoulder, not elsewhere classified: Secondary | ICD-10-CM | POA: Diagnosis not present

## 2022-04-30 DIAGNOSIS — M25622 Stiffness of left elbow, not elsewhere classified: Secondary | ICD-10-CM

## 2022-04-30 DIAGNOSIS — M6281 Muscle weakness (generalized): Secondary | ICD-10-CM

## 2022-04-30 DIAGNOSIS — M25522 Pain in left elbow: Secondary | ICD-10-CM | POA: Diagnosis not present

## 2022-04-30 DIAGNOSIS — R600 Localized edema: Secondary | ICD-10-CM

## 2022-04-30 NOTE — Therapy (Signed)
OUTPATIENT PHYSICAL THERAPY SHOULDER EVALUATION   Patient Name: Meagan Moran MRN: [id number] DOB:08/26/1956, 66 y.o., female Today's Date: 04/30/2022   PT End of Session - 04/30/22 1617     Visit Number 1    Number of Visits 12    Date for PT Re-Evaluation 06/14/22    PT Start Time 1303    PT Stop Time 1345    PT Time Calculation (min) 42 min    Activity Tolerance Patient tolerated treatment well    Behavior During Therapy WFL for tasks assessed/performed             Past Medical History:  Diagnosis Date   Back pain    Diabetes mellitus, type II (HCC)    Heart murmur    Sleep apnea    Past Surgical History:  Procedure Laterality Date   CESAREAN SECTION     Patient Active Problem List   Diagnosis Date Noted   Type 2 diabetes mellitus with hyperglycemia, with long-term current use of insulin (HCC) 08/05/2019    PCP: Merri Brunette MD  REFERRING PROVIDER: Kathryne Hitch, MD  REFERRING DIAG: [phone] (ICD-10-CM) - Humeral head fracture, left, closed, initial encounter S42.402A (ICD-10-CM) - Elbow fracture, left, closed, initial encounter  THERAPY DIAG:  Acute pain of left shoulder  Pain in left elbow  Muscle weakness (generalized)  Stiffness of left shoulder, not elsewhere classified  Stiffness of left elbow, not elsewhere classified  Localized edema  Rationale for Evaluation and Treatment Rehabilitation  ONSET DATE: 3-4 months ago  SUBJECTIVE:                                                                                                                                                                                      SUBJECTIVE STATEMENT: Pt arriving reporting fall going to mailbox. Pt s  PERTINENT HISTORY: back pain, DM, heart mumur, sleep apnea, PTSD, anxiety disorder  PAIN:  Are you having pain? Yes: NPRS scale: 5/10 Pain location: more Left Elbow Pain description: dull pain Aggravating factors: lifting, reaching Relieving  factors: prescription pain meds  PRECAUTIONS: None  WEIGHT BEARING RESTRICTIONS : no  FALLS:  Has patient fallen in last 6 months? Yes. Number of falls 1  LIVING ENVIRONMENT: Lives with: lives with their family Lives in: House/apartment Stairs: Yes: External: 2 steps; none Has following equipment at home: None  OCCUPATION: Not working  PLOF: Independent  PATIENT GOALS stop hurting, lift my arm without pain  OBJECTIVE:   DIAGNOSTIC FINDINGS:  3 views of the left shoulder show well located shoulder.  There is a  healing proximal humerus fracture there is a 2 part fracture with the  humeral shaft impacted into the head.  The head is well located within the  glenohumeral joint.  There has been significant interval healing from the  past x-rays comparison.  PATIENT SURVEYS:  04/30/22: FOTO 59% (predicted 65%)  COGNITION:  04/30/2022: Overall cognitive status: Within functional limits for tasks assessed     SENSATION: 04/30/2022: WFL  POSTURE: 04/30/2022: Rounded shoulders forward head  UPPER EXTREMITY ROM:   Active ROM Right Eval 04/30/2022:  sitting Left Eval 04/30/2022 sitting  Shoulder flexion 155 70  Shoulder extension 45 40  Shoulder abduction 140 34  Shoulder adduction    Shoulder internal rotation 65 60  Shoulder external rotation 62 30  Elbow flexion 140 90  Elbow extension 0 -35  Wrist flexion    Wrist extension    Wrist ulnar deviation    Wrist radial deviation    Wrist pronation    Wrist supination    (Blank rows = not tested)  UPPER EXTREMITY MMT:  MMT Right Eval 04/30/2022 Left Eval 04/30/2022  Shoulder flexion 5/5 2/5  Shoulder extension 5/5 2/5  Shoulder abduction 5/5 2/5  Shoulder adduction    Shoulder internal rotation 5/5 2/5  Shoulder external rotation 5/5 2/5  Middle trapezius    Lower trapezius    Elbow flexion 5/5 2/5  Elbow extension 5/5 2/5  Wrist flexion    Wrist extension    Wrist ulnar deviation    Wrist radial deviation     Wrist pronation    Wrist supination    Grip strength (lbs) 14.1 41.2  (Blank rows = not tested)     PALPATION:  TTP: left anterior and lateral shoulder, lateral left elbow   TODAY'S TREATMENT:  04/30/2022: HEP instruction/performance c cues for techniques, handout provided.  Trial set performed of each for comprehension and symptom assessment.  See below for exercise list.   PATIENT EDUCATION: Education details: PT POC, HEP Person educated: Patient Education method: Explanation, Demonstration, Tactile cues, Verbal cues, and Handouts Education comprehension: verbalized understanding, returned demonstration, and verbal cues required   HOME EXERCISE PROGRAM: Access Code: [email or web address] URL: [email or web address] Date: 04/30/2022 Prepared by: Narda AmberJennifer Karmel Patricelli  Exercises - Seated Shoulder Flexion Towel Slide at Table Top  - 3 x daily - 7 x weekly - 10 reps - 3-5 supi hold - Seated Shoulder Scaption Slide at Table Top with Forearm in Neutral  - 3 x daily - 7 x weekly - 10 reps - 3- 5 seconds hold - Seated Shoulder External Rotation PROM on Table  - 3 x daily - 7 x weekly - 10 reps - 3-5 seconds hold - Seated Scapular Retraction  - 3 x daily - 7 x weekly - 10 reps - 3-5 seconds hold  ASSESSMENT:  CLINICAL IMPRESSION: Patient is a 66 y.o. who comes to clinic with complaints of left shoulder pain s/p humeral head fracture and left elbow fx with mobility, strength and movement coordination deficits that impair their ability to perform usual daily and recreational functional activities without increase difficulty/symptoms.  Patient to benefit from skilled PT services to address impairments and limitations to improve to previous level of function without restriction secondary to condition.    OBJECTIVE IMPAIRMENTS Abnormal gait, decreased activity tolerance, decreased mobility, decreased ROM, decreased strength, increased edema, impaired flexibility, impaired UE functional use,  and pain.   ACTIVITY LIMITATIONS carrying, lifting, bed mobility, bathing, toileting, dressing, and reach over head  PARTICIPATION LIMITATIONS: cleaning, laundry, shopping, and community activity  PERSONAL FACTORS back pain, DM, heart mumur,  sleep apnea,  are also affecting patient's functional outcome.   REHAB POTENTIAL: Fair to good  CLINICAL DECISION MAKING: Stable/uncomplicated  EVALUATION COMPLEXITY: moderate due to pt's PTSD and anxiety as well as multiple joint involvement   GOALS: Goals reviewed with patient? Yes  SHORT TERM GOALS: Target date: 05/21/2022  (Remove Blue Hyperlink)  Patient will demonstrate independent use of initial home exercise program to maintain progress from in clinic treatments. Goal status: New   Long term PT goals (target dates for all long term goals are 6 weeks  06/14/2022 ) Patient will demonstrate/report pain at worst less than or equal to 2/10 to facilitate minimal limitation in daily activity secondary to pain symptoms. Goal status: New   Patient will demonstrate independent use of home exercise program to facilitate ability to maintain/progress functional gains from skilled physical therapy services. Goal status: New   Patient will demonstrate FOTO outcome > or = 65 % to indicate reduced disability due to condition. Goal status: New   Pt will improve her Left active shoulder flexion to >/= 120 degrees. Goal status: New        5.  Pt  will improve her left UE strength to >/= 3/5 for improved functional mobility and ADL's.   Goal status: New     PLAN: PT FREQUENCY: 2x/week  PT DURATION: 6 weeks  PLANNED INTERVENTIONS: Therapeutic exercises, Therapeutic activity, Neuromuscular re-education, Balance training, Gait training, Patient/Family education, Joint mobilization, Dry Needling, Cryotherapy, Moist heat, Taping, Vasopneumatic device, and Manual therapy  PLAN FOR NEXT SESSION: shoulder ROM, gentle strengthening, review HEP, modalities  as needed, begin isometrics, Windle Guard, PT, MPT 04/30/2022, 4:18 PM  Referring diagnosis? T06.269S (ICD-10-CM) - Humeral head fracture, left, closed, initial encounter S42.402A (ICD-10-CM) - Elbow fracture, left, closed, initial encounter Treatment diagnosis? (if different than referring diagnosis) M25.512, M25.522, M62.82, M25.612, M25.622, R60.0 What was this (referring dx) caused by? []  Surgery [x]  Fall []  Ongoing issue []  Arthritis []  Other: ____________  Laterality: []  Rt [x]  Lt []  Both  Check all possible CPT codes:  *CHOOSE 10 OR LESS*    [x]  (Therapeutic Exercise)  []  92507 (SLP Treatment)  [x]  97112 (Neuro Re-ed)   []  92526 (Swallowing Treatment)   []  97116 (Gait Training)   []  (Cognitive Training, 1st 15 minutes) [x]  97140 (Manual Therapy)   []  97130 (Cognitive Training, each add'l 15 minutes)  []  97164 (Re-evaluation)                              []  Other, List CPT Code ____________  [x]  97530 (Therapeutic Activities)     []  97535 (Self Care)   []  All codes above (97110 - 97535)  []  97012 (Mechanical Traction)  [x]  97014 (E-stim Unattended)  []  97032 (E-stim manual)  []  97033 (Ionto)  [x]  97035 (Ultrasound)  []  97760 (Orthotic Fit) []  97750 (Physical Performance Training) []  (Aquatic Therapy) []  97034 (Contrast Bath) []  (Paraffin) []  97597 (Wound Care 1st 20 sq cm) []  97598 (Wound Care each add'l 20 sq cm) [x]  97016 (Vasopneumatic Device) []   ) []  (Prosthetic Training)

## 2022-05-01 DIAGNOSIS — F332 Major depressive disorder, recurrent severe without psychotic features: Secondary | ICD-10-CM | POA: Diagnosis not present

## 2022-05-01 DIAGNOSIS — F411 Generalized anxiety disorder: Secondary | ICD-10-CM | POA: Diagnosis not present

## 2022-05-01 DIAGNOSIS — F4312 Post-traumatic stress disorder, chronic: Secondary | ICD-10-CM | POA: Diagnosis not present

## 2022-05-03 DIAGNOSIS — F4323 Adjustment disorder with mixed anxiety and depressed mood: Secondary | ICD-10-CM | POA: Diagnosis not present

## 2022-05-06 ENCOUNTER — Ambulatory Visit: Payer: Medicare PPO | Admitting: Orthopaedic Surgery

## 2022-05-08 DIAGNOSIS — F332 Major depressive disorder, recurrent severe without psychotic features: Secondary | ICD-10-CM | POA: Diagnosis not present

## 2022-05-08 DIAGNOSIS — F4312 Post-traumatic stress disorder, chronic: Secondary | ICD-10-CM | POA: Diagnosis not present

## 2022-05-08 DIAGNOSIS — F411 Generalized anxiety disorder: Secondary | ICD-10-CM | POA: Diagnosis not present

## 2022-05-09 DIAGNOSIS — H2513 Age-related nuclear cataract, bilateral: Secondary | ICD-10-CM | POA: Diagnosis not present

## 2022-05-09 DIAGNOSIS — E119 Type 2 diabetes mellitus without complications: Secondary | ICD-10-CM | POA: Diagnosis not present

## 2022-05-09 DIAGNOSIS — H43813 Vitreous degeneration, bilateral: Secondary | ICD-10-CM | POA: Diagnosis not present

## 2022-05-14 ENCOUNTER — Encounter: Payer: Self-pay | Admitting: Physical Therapy

## 2022-05-14 ENCOUNTER — Ambulatory Visit: Payer: Medicare PPO | Admitting: Physical Therapy

## 2022-05-14 DIAGNOSIS — R600 Localized edema: Secondary | ICD-10-CM

## 2022-05-14 DIAGNOSIS — M6281 Muscle weakness (generalized): Secondary | ICD-10-CM

## 2022-05-14 DIAGNOSIS — M25512 Pain in left shoulder: Secondary | ICD-10-CM

## 2022-05-14 DIAGNOSIS — M25522 Pain in left elbow: Secondary | ICD-10-CM

## 2022-05-14 DIAGNOSIS — M25612 Stiffness of left shoulder, not elsewhere classified: Secondary | ICD-10-CM | POA: Diagnosis not present

## 2022-05-14 DIAGNOSIS — M25622 Stiffness of left elbow, not elsewhere classified: Secondary | ICD-10-CM

## 2022-05-14 NOTE — Therapy (Signed)
OUTPATIENT PHYSICAL THERAPY TREATMENT NOTE   Patient Name: Meagan Moran MRN: [id number] DOB:Jan 19, 1956, 66 y.o., female Today's Date: 05/14/2022  PCP: Merri Brunette MD REFERRING PROVIDER: Kathryne Hitch, MD  END OF SESSION:   PT End of Session - 05/14/22 1107     Visit Number 2    Number of Visits 12    Date for PT Re-Evaluation 06/14/22    PT Start Time 1100    PT Stop Time 1140    PT Time Calculation (min) 40 min    Activity Tolerance Patient tolerated treatment well    Behavior During Therapy WFL for tasks assessed/performed             Past Medical History:  Diagnosis Date   Back pain    Diabetes mellitus, type II (HCC)    Heart murmur    Sleep apnea    Past Surgical History:  Procedure Laterality Date   CESAREAN SECTION     Patient Active Problem List   Diagnosis Date Noted   Type 2 diabetes mellitus with hyperglycemia, with long-term current use of insulin (HCC) 08/05/2019    REFERRING DIAG: N82.956O (ICD-10-CM) - Humeral head fracture, left, closed, initial encounter S42.402A (ICD-10-CM) - Elbow fracture, left, closed, initial encounter  THERAPY DIAG:  Acute pain of left shoulder  Pain in left elbow  Muscle weakness (generalized)  Stiffness of left shoulder, not elsewhere classified  Stiffness of left elbow, not elsewhere classified  Localized edema  Rationale for Evaluation and Treatment Rehabilitation  PERTINENT HISTORY: back pain, DM, heart mumur, sleep apnea, PTSD, anxiety   PRECAUTIONS: none  SUBJECTIVE: Pt arriving with caregiver Ronaldo Miyamoto today. Pt reporting 5/10 pain in left shoulder and left elbow.   PAIN:  Are you having pain? Yes: NPRS scale: 5/10 Pain location: left shoulder and elbow Pain description: soreness, tightness,  Aggravating factors: reaching, lifting Relieving factors: resting   OBJECTIVE: (objective measures completed at initial evaluation unless otherwise dated) DIAGNOSTIC FINDINGS:  3 views of the  left shoulder show well located shoulder.  There is a  healing proximal humerus fracture there is a 2 part fracture with the  humeral shaft impacted into the head.  The head is well located within the  glenohumeral joint.  There has been significant interval healing from the  past x-rays comparison.   PATIENT SURVEYS:  04/30/22: FOTO 59% (predicted 65%)   COGNITION:           04/30/2022: Overall cognitive status: Within functional limits for tasks assessed                                  SENSATION: 04/30/2022: WFL   POSTURE: 04/30/2022: Rounded shoulders forward head   UPPER EXTREMITY ROM:    Active ROM Right Eval 04/30/2022:  sitting Left Eval 04/30/2022 sitting Left 05/14/22 supine  Shoulder flexion 155 70 120  Shoulder extension 45 40 40  Shoulder abduction 140 34 95  Shoulder adduction       Shoulder internal rotation 65 60   Shoulder external rotation 62 30   Elbow flexion 140 90 130  Elbow extension 0 -35 -35  Wrist flexion       Wrist extension       Wrist ulnar deviation       Wrist radial deviation       Wrist pronation       Wrist supination       (  Blank rows = not tested)   UPPER EXTREMITY MMT:   MMT Right Eval 04/30/2022 Left Eval 04/30/2022  Shoulder flexion 5/5 2/5  Shoulder extension 5/5 2/5  Shoulder abduction 5/5 2/5  Shoulder adduction      Shoulder internal rotation 5/5 2/5  Shoulder external rotation 5/5 2/5  Middle trapezius      Lower trapezius      Elbow flexion 5/5 2/5  Elbow extension 5/5 2/5  Wrist flexion      Wrist extension      Wrist ulnar deviation      Wrist radial deviation      Wrist pronation      Wrist supination      Grip strength (lbs) 14.1 41.2  (Blank rows = not tested)         PALPATION:  TTP: left anterior and lateral shoulder, lateral left elbow             TODAY'S TREATMENT:  05/14/2022:  TherEx:  Pulleys flexion and abduction x 2 minutes each Standing rows: Level 2 band 3 x 15 Standing bicep curls 2# bar 2 x  15 Walll ladder x 5 Left UE Rolling yellow physioball up wall bilateral UE's x 10 UE ranger standing circles both directions x 20  Supine AAROM shoulder flexion 1 # bar 2 x 10 Supine AAROM ER Left 1 # bar 2 x 10 Manual PROM Left shoulder and Elbow, Grade 2-3 GH AP and inferior mobs  04/30/2022: HEP instruction/performance c cues for techniques, handout provided.  Trial set performed of each for comprehension and symptom assessment.  See below for exercise list.     PATIENT EDUCATION: Education details: PT POC, HEP Person educated: Patient Education method: Explanation, Demonstration, Tactile cues, Verbal cues, and Handouts Education comprehension: verbalized understanding, returned demonstration, and verbal cues required     HOME EXERCISE PROGRAM: Access Code: [email or web address] URL: [email or web address] Date: 04/30/2022 Prepared by: Kearney Hard   Exercises - Seated Shoulder Flexion Towel Slide at Table Top  - 3 x daily - 7 x weekly - 10 reps - 3-5 supi hold - Seated Shoulder Scaption Slide at Table Top with Forearm in Neutral  - 3 x daily - 7 x weekly - 10 reps - 3- 5 seconds hold - Seated Shoulder External Rotation PROM on Table  - 3 x daily - 7 x weekly - 10 reps - 3-5 seconds hold - Seated Scapular Retraction  - 3 x daily - 7 x weekly - 10 reps - 3-5 seconds hold   ASSESSMENT:   CLINICAL IMPRESSION: Pt arriving today reporting 5/10 pain in left shoulder and left elbow. Pt tolerating exercises well with increased pain reported toward end of session. Pt able to demonstrate compliance in her beginner HEP. Pt recommended continued with current HEP and skilled PT to maximize pt's function.      OBJECTIVE IMPAIRMENTS Abnormal gait, decreased activity tolerance, decreased mobility, decreased ROM, decreased strength, increased edema, impaired flexibility, impaired UE functional use, and pain.    ACTIVITY LIMITATIONS carrying, lifting, bed mobility, bathing, toileting,  dressing, and reach over head   PARTICIPATION LIMITATIONS: cleaning, laundry, shopping, and community activity   PERSONAL FACTORS back pain, DM, heart mumur, sleep apnea,  are also affecting patient's functional outcome.    REHAB POTENTIAL: Fair to good   CLINICAL DECISION MAKING: Stable/uncomplicated   EVALUATION COMPLEXITY: moderate due to pt's PTSD and anxiety as well as multiple joint involvement     GOALS: Goals reviewed with patient? Yes  SHORT TERM GOALS: Target date: 05/21/2022  (Remove Blue Hyperlink)   Patient will demonstrate independent use of initial home exercise program to maintain progress from in clinic treatments. Goal status: New   Long term PT goals (target dates for all long term goals are 6 weeks  06/14/2022 ) Patient will demonstrate/report pain at worst less than or equal to 2/10 to facilitate minimal limitation in daily activity secondary to pain symptoms. Goal status: New   Patient will demonstrate independent use of home exercise program to facilitate ability to maintain/progress functional gains from skilled physical therapy services. Goal status: New   Patient will demonstrate FOTO outcome > or = 65 % to indicate reduced disability due to condition. Goal status: New   Pt will improve her Left active shoulder flexion to >/= 120 degrees. Goal status: New        5.  Pt  will improve her left UE strength to >/= 3/5 for improved functional mobility and ADL's.   Goal status: New       PLAN: PT FREQUENCY: 2x/week   PT DURATION: 6 weeks   PLANNED INTERVENTIONS: Therapeutic exercises, Therapeutic activity, Neuromuscular re-education, Balance training, Gait training, Patient/Family education, Joint mobilization, Dry Needling, Cryotherapy, Moist heat, Taping, Vasopneumatic device, and Manual therapy   PLAN FOR NEXT SESSION: shoulder ROM, gentle strengthening, review HEP, modalities as needed, begin isometrics, Rosaland Lao, PT,  MPT 05/14/2022, 11:45 AM

## 2022-05-15 DIAGNOSIS — F332 Major depressive disorder, recurrent severe without psychotic features: Secondary | ICD-10-CM | POA: Diagnosis not present

## 2022-05-15 DIAGNOSIS — E1165 Type 2 diabetes mellitus with hyperglycemia: Secondary | ICD-10-CM | POA: Diagnosis not present

## 2022-05-15 DIAGNOSIS — F4312 Post-traumatic stress disorder, chronic: Secondary | ICD-10-CM | POA: Diagnosis not present

## 2022-05-15 DIAGNOSIS — F411 Generalized anxiety disorder: Secondary | ICD-10-CM | POA: Diagnosis not present

## 2022-05-21 ENCOUNTER — Encounter: Payer: Self-pay | Admitting: Physical Therapy

## 2022-05-21 ENCOUNTER — Ambulatory Visit: Payer: Medicare PPO | Admitting: Physical Therapy

## 2022-05-21 DIAGNOSIS — M25512 Pain in left shoulder: Secondary | ICD-10-CM

## 2022-05-21 DIAGNOSIS — M25622 Stiffness of left elbow, not elsewhere classified: Secondary | ICD-10-CM | POA: Diagnosis not present

## 2022-05-21 DIAGNOSIS — M25522 Pain in left elbow: Secondary | ICD-10-CM

## 2022-05-21 DIAGNOSIS — M6281 Muscle weakness (generalized): Secondary | ICD-10-CM

## 2022-05-21 DIAGNOSIS — M25612 Stiffness of left shoulder, not elsewhere classified: Secondary | ICD-10-CM | POA: Diagnosis not present

## 2022-05-21 DIAGNOSIS — R600 Localized edema: Secondary | ICD-10-CM

## 2022-05-22 DIAGNOSIS — F4312 Post-traumatic stress disorder, chronic: Secondary | ICD-10-CM | POA: Diagnosis not present

## 2022-05-22 DIAGNOSIS — F411 Generalized anxiety disorder: Secondary | ICD-10-CM | POA: Diagnosis not present

## 2022-05-22 DIAGNOSIS — F332 Major depressive disorder, recurrent severe without psychotic features: Secondary | ICD-10-CM | POA: Diagnosis not present

## 2022-05-27 ENCOUNTER — Encounter: Payer: Self-pay | Admitting: Physical Therapy

## 2022-05-27 ENCOUNTER — Ambulatory Visit: Payer: Medicare PPO | Admitting: Physical Therapy

## 2022-05-27 DIAGNOSIS — M6281 Muscle weakness (generalized): Secondary | ICD-10-CM | POA: Diagnosis not present

## 2022-05-27 DIAGNOSIS — M25522 Pain in left elbow: Secondary | ICD-10-CM

## 2022-05-27 DIAGNOSIS — M25612 Stiffness of left shoulder, not elsewhere classified: Secondary | ICD-10-CM

## 2022-05-27 DIAGNOSIS — M25512 Pain in left shoulder: Secondary | ICD-10-CM

## 2022-05-27 DIAGNOSIS — R600 Localized edema: Secondary | ICD-10-CM | POA: Diagnosis not present

## 2022-05-27 DIAGNOSIS — M25622 Stiffness of left elbow, not elsewhere classified: Secondary | ICD-10-CM | POA: Diagnosis not present

## 2022-05-27 NOTE — Therapy (Signed)
OUTPATIENT PHYSICAL THERAPY TREATMENT NOTE   Patient Name: Meagan Moran MRN: [id number] DOB:[birth year], 66 y.o., female Today's Date: 05/27/2022  PCP: Carol Ada MD REFERRING PROVIDER: Mcarthur Rossetti, MD  END OF SESSION:   PT End of Session - 05/27/22 1305     Visit Number 4    Number of Visits 12    Date for PT Re-Evaluation 06/14/22    PT Start Time 1300    PT Stop Time 1340    PT Time Calculation (min) 40 min    Activity Tolerance Patient tolerated treatment well    Behavior During Therapy WFL for tasks assessed/performed               Past Medical History:  Diagnosis Date   Back pain    Diabetes mellitus, type II ([place])    Heart murmur    Sleep apnea    Past Surgical History:  Procedure Laterality Date   CESAREAN SECTION     Patient Active Problem List   Diagnosis Date Noted   Type 2 diabetes mellitus with hyperglycemia, with long-term current use of insulin ([place]) 08/05/2019    REFERRING DIAG: Y50.354S (ICD-10-CM) - Humeral head fracture, left, closed, initial encounter S42.402A (ICD-10-CM) - Elbow fracture, left, closed, initial encounter  THERAPY DIAG:  Acute pain of left shoulder  Pain in left elbow  Muscle weakness (generalized)  Stiffness of left shoulder, not elsewhere classified  Stiffness of left elbow, not elsewhere classified  Localized edema  Rationale for Evaluation and Treatment Rehabilitation  PERTINENT HISTORY: back pain, DM, heart mumur, sleep apnea, PTSD, anxiety   PRECAUTIONS: none  SUBJECTIVE: Pt stating 5/10 pain in left shoulder.   PAIN:  Are you having pain? Yes: NPRS scale: 5/10 Pain location: left shoulder and elbow Pain description: soreness, tightness,  Aggravating factors: reaching, lifting Relieving factors: resting   OBJECTIVE: (objective measures completed at initial evaluation unless otherwise dated) DIAGNOSTIC FINDINGS:  3 views of the left shoulder show well located shoulder.  There is a   healing proximal humerus fracture there is a 2 part fracture with the  humeral shaft impacted into the head.  The head is well located within the  glenohumeral joint.  There has been significant interval healing from the  past x-rays comparison.   PATIENT SURVEYS:  04/30/22: FOTO 59% (predicted 65%)   COGNITION:           04/30/2022: Overall cognitive status: Within functional limits for tasks assessed                                  SENSATION: 04/30/2022: WFL   POSTURE: 04/30/2022: Rounded shoulders forward head   UPPER EXTREMITY ROM:    Active ROM Right Eval 04/30/2022:  sitting Left Eval 04/30/2022 sitting Left 05/14/22 supine Left 04/2722 supine  Shoulder flexion 155 70 120 124  Shoulder extension 45 40 40 42  Shoulder abduction 140 34 95 100  Shoulder adduction        Shoulder internal rotation 65 60    Shoulder external rotation 62 30    Elbow flexion 140 90 130 130  Elbow extension 0 -35 -35 -38  Wrist flexion        Wrist extension        Wrist ulnar deviation        Wrist radial deviation        Wrist pronation  Wrist supination        (Blank rows = not tested)   UPPER EXTREMITY MMT:   MMT Right Eval 04/30/2022 Left Eval 04/30/2022  Shoulder flexion 5/5 2/5  Shoulder extension 5/5 2/5  Shoulder abduction 5/5 2/5  Shoulder adduction      Shoulder internal rotation 5/5 2/5  Shoulder external rotation 5/5 2/5  Middle trapezius      Lower trapezius      Elbow flexion 5/5 2/5  Elbow extension 5/5 2/5  Wrist flexion      Wrist extension      Wrist ulnar deviation      Wrist radial deviation      Wrist pronation      Wrist supination      Grip strength (lbs) 14.1 41.2  (Blank rows = not tested)         PALPATION:  TTP: left anterior and lateral shoulder, lateral left elbow             TODAY'S TREATMENT:  05/27/2022:  TherEx:  Pulleys flexion: x 3 minutes, abduction x 2 minutes Standing rows: Level 2 band 3 x 15 Standing bicep curls 3# weight x  15  Wall ladder x 5 Left UE Seated Rolling green physioball forward and back bilateral UE's x 10 Standing shoulder abduction AAROM: x 20 holding 3-5 seconds Wall ladder abduction x 10 Door stretch at 60 degrees x 3 holding 30 seconds Supine AAROM flexion x 15 holding 3 seconds Supine AAROM ER x 15 holding 3 seconds Manual PROM Left shoulder and Elbow, Grade 2-3 GH AP and inferior mobs    05/21/2022:  TherEx:  UBE: Level 1 x 6 minutes (3 each direction) Standing rows: Level 2 band 3 x 15 Standing bicep curls 2# bar 2 x 15 Wall ladder x 5 Left UE Rolling red physioball up wall bilateral UE's x 10 UE ranger standing circles both directions x 20  Supine AAROM shoulder flexion 1 # bar 2 x 10 Supine AAROM ER Left 1 # bar 2 x 10 Seated ER table stretch x 10 holding 10 seconds Manual PROM Left shoulder and Elbow, Grade 2-3 GH AP and inferior mobs    05/14/2022:  TherEx:  Pulleys flexion and abduction x 2 minutes each Standing rows: Level 2 band 3 x 15 Standing bicep curls 2# bar 2 x 15 Shoulder extension with 2 # bar x 15 Walll ladder x 5 Left UE Rolling yellow physioball up wall bilateral UE's x 10 UE ranger standing circles both directions x 20  Supine AAROM shoulder flexion 1 # bar 2 x 10 Supine AAROM ER Left 1 # bar 2 x 10 Manual PROM Left shoulder and Elbow, Grade 2-3 GH AP and inferior mobs    PATIENT EDUCATION: Education details: PT POC, HEP Person educated: Patient Education method: Consulting civil engineer, Demonstration, Tactile cues, Verbal cues, and Handouts Education comprehension: verbalized understanding, returned demonstration, and verbal cues required     HOME EXERCISE PROGRAM:   Access Code: [email or web address] URL: [email or web address] Date: 05/27/2022 Prepared by: Kearney Hard  Exercises - Seated Shoulder Flexion Towel Slide at Table Top  - 3 x daily - 7 x weekly - 10 reps - 3-5 supi hold - Seated Shoulder Scaption Slide at Table Top with Forearm in  Neutral  - 3 x daily - 7 x weekly - 10 reps - 3- 5 seconds hold - Seated Shoulder External Rotation PROM on Table  - 3 x daily - 7 x weekly - 10 reps -  3-5 seconds hold - Seated Scapular Retraction  - 3 x daily - 7 x weekly - 10 reps - 3-5 seconds hold - Doorway Pec Stretch at 60 Elevation  - 1-2 x daily - 7 x weekly - 3 reps - 30 seconds hold - Corner Pec Minor Stretch  - 1-2 x daily - 7 x weekly - 3 reps - 30 seocncs hold - Supine Shoulder Flexion Extension AAROM with Dowel  - 1-2 x daily - 7 x weekly - 10 reps - 5 seconds hold - Supine Shoulder External Rotation in 45 Degrees Abduction AAROM with Dowel  - 1-2 x daily - 7 x weekly - 10 reps - 5 seconds hold - Shoulder External Rotation and Scapular Retraction with Resistance  - 1-2 x daily - 7 x weekly - 2 sets - 10 reps - 3 seconds hold - Standing Row with Anchored Resistance  - 1-2 x daily - 7 x weekly - 2 sets - 10 reps - 3 seconds hold ASSESSMENT:   CLINICAL IMPRESSION: Pt reporting 5/10 pain in her left anterior shoulder. Pt's husband present during session and took pictures of pt performing exercises from her updated HEP.  Continue skilled PT to progress toward pt's LTG's.      OBJECTIVE IMPAIRMENTS Abnormal gait, decreased activity tolerance, decreased mobility, decreased ROM, decreased strength, increased edema, impaired flexibility, impaired UE functional use, and pain.    ACTIVITY LIMITATIONS carrying, lifting, bed mobility, bathing, toileting, dressing, and reach over head   PARTICIPATION LIMITATIONS: cleaning, laundry, shopping, and community activity   PERSONAL FACTORS back pain, DM, heart mumur, sleep apnea,  are also affecting patient's functional outcome.    REHAB POTENTIAL: Fair to good   CLINICAL DECISION MAKING: Stable/uncomplicated   EVALUATION COMPLEXITY: moderate due to pt's PTSD and anxiety as well as multiple joint involvement     GOALS: Goals reviewed with patient? Yes   SHORT TERM GOALS: Target date:  05/21/2022  (Remove Blue Hyperlink)   Patient will demonstrate independent use of initial home exercise program to maintain progress from in clinic treatments. Goal status: MET 05/21/2022   Long term PT goals (target dates for all long term goals are 6 weeks  06/14/2022 ) Patient will demonstrate/report pain at worst less than or equal to 2/10 to facilitate minimal limitation in daily activity secondary to pain symptoms. Goal status: ongoing 05/27/22   Patient will demonstrate independent use of home exercise program to facilitate ability to maintain/progress functional gains from skilled physical therapy services. Goal status: New   Patient will demonstrate FOTO outcome > or = 65 % to indicate reduced disability due to condition. Goal status: New   Pt will improve her Left active shoulder flexion to >/= 120 degrees. Goal status: on-going 05/27/22        5.  Pt  will improve her left UE strength to >/= 3/5 for improved functional mobility and ADL's.   Goal status: New       PLAN: PT FREQUENCY: 2x/week   PT DURATION: 6 weeks   PLANNED INTERVENTIONS: Therapeutic exercises, Therapeutic activity, Neuromuscular re-education, Balance training, Gait training, Patient/Family education, Joint mobilization, Dry Needling, Cryotherapy, Moist heat, Taping, Vasopneumatic device, and Manual therapy   PLAN FOR NEXT SESSION: continue shoulder ROM, gentle strengthening      Oretha Caprice, PT, MPT 05/27/2022, 1:13 PM

## 2022-05-29 DIAGNOSIS — F332 Major depressive disorder, recurrent severe without psychotic features: Secondary | ICD-10-CM | POA: Diagnosis not present

## 2022-05-29 DIAGNOSIS — F4312 Post-traumatic stress disorder, chronic: Secondary | ICD-10-CM | POA: Diagnosis not present

## 2022-05-29 DIAGNOSIS — F411 Generalized anxiety disorder: Secondary | ICD-10-CM | POA: Diagnosis not present

## 2022-06-03 ENCOUNTER — Encounter: Payer: Self-pay | Admitting: Physical Therapy

## 2022-06-03 ENCOUNTER — Ambulatory Visit: Payer: Medicare PPO | Admitting: Physical Therapy

## 2022-06-03 DIAGNOSIS — M6281 Muscle weakness (generalized): Secondary | ICD-10-CM

## 2022-06-03 DIAGNOSIS — M25512 Pain in left shoulder: Secondary | ICD-10-CM

## 2022-06-03 DIAGNOSIS — R600 Localized edema: Secondary | ICD-10-CM

## 2022-06-03 DIAGNOSIS — M25522 Pain in left elbow: Secondary | ICD-10-CM

## 2022-06-03 DIAGNOSIS — M25612 Stiffness of left shoulder, not elsewhere classified: Secondary | ICD-10-CM

## 2022-06-03 DIAGNOSIS — M25622 Stiffness of left elbow, not elsewhere classified: Secondary | ICD-10-CM

## 2022-06-03 NOTE — Therapy (Signed)
OUTPATIENT PHYSICAL THERAPY TREATMENT NOTE   Patient Name: Meagan Moran MRN: [id number] DOB:23-Jun-1956, 66 y.o., female Today's Date: 06/03/2022  PCP: Carol Ada MD REFERRING PROVIDER: Mcarthur Rossetti, MD  END OF SESSION:   PT End of Session - 06/03/22 1326     Visit Number 5    Number of Visits 12    Date for PT Re-Evaluation 06/14/22    PT Start Time 1300    PT Stop Time 1340    PT Time Calculation (min) 40 min    Activity Tolerance Patient tolerated treatment well    Behavior During Therapy WFL for tasks assessed/performed               Past Medical History:  Diagnosis Date   Back pain    Diabetes mellitus, type II ([place])    Heart murmur    Sleep apnea    Past Surgical History:  Procedure Laterality Date   CESAREAN SECTION     Patient Active Problem List   Diagnosis Date Noted   Type 2 diabetes mellitus with hyperglycemia, with long-term current use of insulin ([place]) 08/05/2019    REFERRING DIAG: B84.665L (ICD-10-CM) - Humeral head fracture, left, closed, initial encounter S42.402A (ICD-10-CM) - Elbow fracture, left, closed, initial encounter  THERAPY DIAG:  Acute pain of left shoulder  Pain in left elbow  Muscle weakness (generalized)  Stiffness of left shoulder, not elsewhere classified  Stiffness of left elbow, not elsewhere classified  Localized edema  Rationale for Evaluation and Treatment Rehabilitation  PERTINENT HISTORY: back pain, DM, heart mumur, sleep apnea, PTSD, anxiety   PRECAUTIONS: none  SUBJECTIVE: Pt stating 7/10 pain in left shoulder. Pt and husband reporting using pulleys at home has seemed to help.   PAIN:  Are you having pain? Yes: NPRS scale: 7/10 Pain location: left shoulder and elbow Pain description: soreness, tightness,  Aggravating factors: reaching, lifting Relieving factors: resting   OBJECTIVE: (objective measures completed at initial evaluation unless otherwise dated) DIAGNOSTIC FINDINGS:   3 views of the left shoulder show well located shoulder.  There is a  healing proximal humerus fracture there is a 2 part fracture with the  humeral shaft impacted into the head.  The head is well located within the  glenohumeral joint.  There has been significant interval healing from the  past x-rays comparison.   PATIENT SURVEYS:  04/30/22: FOTO 59% (predicted 65%)   COGNITION:           04/30/2022: Overall cognitive status: Within functional limits for tasks assessed                                  SENSATION: 04/30/2022: WFL   POSTURE: 04/30/2022: Rounded shoulders forward head   UPPER EXTREMITY ROM:    Active ROM Right Eval 04/30/2022:  sitting Left Eval 04/30/2022 sitting Left 05/14/22 supine Left 04/2722 supine Left 06/03/22 Supine passive  Shoulder flexion 155 70 120 124 130  Shoulder extension 45 40 40 42   Shoulder abduction 140 34 95 100 110  Shoulder adduction         Shoulder internal rotation 65 60     Shoulder external rotation 62 30   40  Elbow flexion 140 90 130 130   Elbow extension 0 -35 -35 -38   Wrist flexion         Wrist extension         Wrist ulnar  deviation         Wrist radial deviation         Wrist pronation         Wrist supination         (Blank rows = not tested)   UPPER EXTREMITY MMT:   MMT Right Eval 04/30/2022 Left Eval 04/30/2022  Shoulder flexion 5/5 2/5  Shoulder extension 5/5 2/5  Shoulder abduction 5/5 2/5  Shoulder adduction      Shoulder internal rotation 5/5 2/5  Shoulder external rotation 5/5 2/5  Middle trapezius      Lower trapezius      Elbow flexion 5/5 2/5  Elbow extension 5/5 2/5  Wrist flexion      Wrist extension      Wrist ulnar deviation      Wrist radial deviation      Wrist pronation      Wrist supination      Grip strength (lbs) 14.1 41.2  (Blank rows = not tested)         PALPATION:  TTP: left anterior and lateral shoulder, lateral left elbow             TODAY'S TREATMENT:  06/03/2022:  TherEx:   Standing rows: Level 2 band 3 x 15 Standing bicep curls Level 2 band 2 x 15 Wall ladder x 5 Left UE flexion and abduction Seated Rolling green physioball forward and back bilateral UE's x 10 Standing shoulder abduction AAROM: x 20 holding 3-5 seconds Door stretch at 60 degrees x 3 holding 30 seconds Standing AAROM flexion  2 # bar x 15 holding 3 seconds Standing AAROM ER  2 # bar x 15 holding 3 seconds Manual PROM Left shoulder and Elbow, Grade 2-3 GH AP and inferior mobs   05/27/2022:  TherEx:  Pulleys flexion: x 3 minutes, abduction x 2 minutes Standing rows: Level 2 band 3 x 15 Standing bicep curls 3# weight x 15  Wall ladder x 5 Left UE Seated Rolling green physioball forward and back bilateral UE's x 10 Standing shoulder abduction AAROM: x 20 holding 3-5 seconds Wall ladder abduction x 10 Door stretch at 60 degrees x 3 holding 30 seconds Supine AAROM flexion x 15 holding 3 seconds Supine AAROM ER x 15 holding 3 seconds Manual PROM Left shoulder and Elbow, Grade 2-3 GH AP and inferior mobs    05/21/2022:  TherEx:  UBE: Level 1 x 6 minutes (3 each direction) Standing rows: Level 2 band 3 x 15 Standing bicep curls 2# bar 2 x 15 Wall ladder x 5 Left UE Rolling red physioball up wall bilateral UE's x 10 UE ranger standing circles both directions x 20  Supine AAROM shoulder flexion 1 # bar 2 x 10 Supine AAROM ER Left 1 # bar 2 x 10 Seated ER table stretch x 10 holding 10 seconds Manual PROM Left shoulder and Elbow, Grade 2-3 GH AP and inferior mobs    05/14/2022:  TherEx:  Pulleys flexion and abduction x 2 minutes each Standing rows: Level 2 band 3 x 15 Standing bicep curls 2# bar 2 x 15 Shoulder extension with 2 # bar x 15 Walll ladder x 5 Left UE Rolling yellow physioball up wall bilateral UE's x 10 UE ranger standing circles both directions x 20  Supine AAROM shoulder flexion 1 # bar 2 x 10 Supine AAROM ER Left 1 # bar 2 x 10 Manual PROM Left shoulder and  Elbow, Grade 2-3 GH AP and inferior  mobs    PATIENT EDUCATION: Education details: PT POC, HEP Person educated: Patient Education method: Consulting civil engineer, Media planner, Corporate treasurer cues, Verbal cues, and Handouts Education comprehension: verbalized understanding, returned demonstration, and verbal cues required     HOME EXERCISE PROGRAM:   Access Code: [email or web address] URL: [email or web address] Date: 05/27/2022 Prepared by: Kearney Hard  Exercises - Seated Shoulder Flexion Towel Slide at Table Top  - 3 x daily - 7 x weekly - 10 reps - 3-5 supi hold - Seated Shoulder Scaption Slide at Table Top with Forearm in Neutral  - 3 x daily - 7 x weekly - 10 reps - 3- 5 seconds hold - Seated Shoulder External Rotation PROM on Table  - 3 x daily - 7 x weekly - 10 reps - 3-5 seconds hold - Seated Scapular Retraction  - 3 x daily - 7 x weekly - 10 reps - 3-5 seconds hold - Doorway Pec Stretch at 60 Elevation  - 1-2 x daily - 7 x weekly - 3 reps - 30 seconds hold - Corner Pec Minor Stretch  - 1-2 x daily - 7 x weekly - 3 reps - 30 seocncs hold - Supine Shoulder Flexion Extension AAROM with Dowel  - 1-2 x daily - 7 x weekly - 10 reps - 5 seconds hold - Supine Shoulder External Rotation in 45 Degrees Abduction AAROM with Dowel  - 1-2 x daily - 7 x weekly - 10 reps - 5 seconds hold - Shoulder External Rotation and Scapular Retraction with Resistance  - 1-2 x daily - 7 x weekly - 2 sets - 10 reps - 3 seconds hold - Standing Row with Anchored Resistance  - 1-2 x daily - 7 x weekly - 2 sets - 10 reps - 3 seconds hold ASSESSMENT:   CLINICAL IMPRESSION: Pt reporting 7/10 pain today in left shoulder, Pt stating her elbow is feeling better. Pt has improved her PROM in left shoulder flexion to 130 degrees and ER to 40 degrees. Continue skilled PT to progress toward pt's LTG's.      OBJECTIVE IMPAIRMENTS Abnormal gait, decreased activity tolerance, decreased mobility, decreased ROM, decreased strength,  increased edema, impaired flexibility, impaired UE functional use, and pain.    ACTIVITY LIMITATIONS carrying, lifting, bed mobility, bathing, toileting, dressing, and reach over head   PARTICIPATION LIMITATIONS: cleaning, laundry, shopping, and community activity   PERSONAL FACTORS back pain, DM, heart mumur, sleep apnea,  are also affecting patient's functional outcome.    REHAB POTENTIAL: Fair to good   CLINICAL DECISION MAKING: Stable/uncomplicated   EVALUATION COMPLEXITY: moderate due to pt's PTSD and anxiety as well as multiple joint involvement     GOALS: Goals reviewed with patient? Yes   SHORT TERM GOALS: Target date: 05/21/2022  (Remove Blue Hyperlink)   Patient will demonstrate independent use of initial home exercise program to maintain progress from in clinic treatments. Goal status: MET 05/21/2022   Long term PT goals (target dates for all long term goals are 6 weeks  06/14/2022 ) Patient will demonstrate/report pain at worst less than or equal to 2/10 to facilitate minimal limitation in daily activity secondary to pain symptoms. Goal status: ongoing 06/03/22   Patient will demonstrate independent use of home exercise program to facilitate ability to maintain/progress functional gains from skilled physical therapy services. Goal status: New   Patient will demonstrate FOTO outcome > or = 65 % to indicate reduced disability due to condition. Goal status: New   Pt will improve her Left active shoulder  flexion to >/= 120 degrees. Goal status: on-going 06/03/22        5.  Pt  will improve her left UE strength to >/= 3/5 for improved functional mobility and ADL's.   Goal status: on-going 06/03/22       PLAN: PT FREQUENCY: 2x/week   PT DURATION: 6 weeks   PLANNED INTERVENTIONS: Therapeutic exercises, Therapeutic activity, Neuromuscular re-education, Balance training, Gait training, Patient/Family education, Joint mobilization, Dry Needling, Cryotherapy, Moist heat,  Taping, Vasopneumatic device, and Manual therapy   PLAN FOR NEXT SESSION: continue shoulder ROM, gentle strengthening, we discussed possible recert at next visit.       Oretha Caprice, PT, MPT 06/03/2022, 1:53 PM

## 2022-06-05 DIAGNOSIS — F332 Major depressive disorder, recurrent severe without psychotic features: Secondary | ICD-10-CM | POA: Diagnosis not present

## 2022-06-05 DIAGNOSIS — F4312 Post-traumatic stress disorder, chronic: Secondary | ICD-10-CM | POA: Diagnosis not present

## 2022-06-05 DIAGNOSIS — F411 Generalized anxiety disorder: Secondary | ICD-10-CM | POA: Diagnosis not present

## 2022-06-10 ENCOUNTER — Ambulatory Visit: Payer: Medicare PPO | Admitting: Physical Therapy

## 2022-06-10 ENCOUNTER — Encounter: Payer: Self-pay | Admitting: Physical Therapy

## 2022-06-10 DIAGNOSIS — R600 Localized edema: Secondary | ICD-10-CM

## 2022-06-10 DIAGNOSIS — M25622 Stiffness of left elbow, not elsewhere classified: Secondary | ICD-10-CM | POA: Diagnosis not present

## 2022-06-10 DIAGNOSIS — M25612 Stiffness of left shoulder, not elsewhere classified: Secondary | ICD-10-CM

## 2022-06-10 DIAGNOSIS — M25522 Pain in left elbow: Secondary | ICD-10-CM | POA: Diagnosis not present

## 2022-06-10 DIAGNOSIS — M25512 Pain in left shoulder: Secondary | ICD-10-CM

## 2022-06-10 DIAGNOSIS — M6281 Muscle weakness (generalized): Secondary | ICD-10-CM

## 2022-06-10 NOTE — Therapy (Signed)
OUTPATIENT PHYSICAL THERAPY TREATMENT NOTE Re-certification   Patient Name: Meagan Moran MRN: [id number] DOB:26-Jul-1956, 65 y.o., female Today's Date: 06/10/2022  PCP: Carol Ada MD REFERRING PROVIDER: Mcarthur Rossetti, MD  END OF SESSION:   PT End of Session - 06/10/22 1307     Visit Number 6    Number of Visits 12    Date for PT Re-Evaluation 06/14/22    PT Start Time 1302    PT Stop Time 1342    PT Time Calculation (min) 40 min    Activity Tolerance Patient tolerated treatment well    Behavior During Therapy WFL for tasks assessed/performed                Past Medical History:  Diagnosis Date   Back pain    Diabetes mellitus, type II ([place])    Heart murmur    Sleep apnea    Past Surgical History:  Procedure Laterality Date   CESAREAN SECTION     Patient Active Problem List   Diagnosis Date Noted   Type 2 diabetes mellitus with hyperglycemia, with long-term current use of insulin ([place]) 08/05/2019    REFERRING DIAG: F35.456Y (ICD-10-CM) - Humeral head fracture, left, closed, initial encounter S42.402A (ICD-10-CM) - Elbow fracture, left, closed, initial encounter  THERAPY DIAG:  Acute pain of left shoulder  Pain in left elbow  Muscle weakness (generalized)  Stiffness of left shoulder, not elsewhere classified  Stiffness of left elbow, not elsewhere classified  Localized edema  Rationale for Evaluation and Treatment Rehabilitation  PERTINENT HISTORY: back pain, DM, heart mumur, sleep apnea, PTSD, anxiety   PRECAUTIONS: none  SUBJECTIVE: Pt stating 6/10 pain in left shoulder.   PAIN:  Are you having pain? Yes: NPRS scale: 6/10 Pain location: left shoulder and elbow Pain description: soreness, tightness,  Aggravating factors: reaching, lifting Relieving factors: resting   OBJECTIVE: (objective measures completed at initial evaluation unless otherwise dated) DIAGNOSTIC FINDINGS:  3 views of the left shoulder show well located  shoulder.  There is a  healing proximal humerus fracture there is a 2 part fracture with the  humeral shaft impacted into the head.  The head is well located within the  glenohumeral joint.  There has been significant interval healing from the  past x-rays comparison.   PATIENT SURVEYS:  04/30/22: FOTO 59% (predicted 65%) 06/10/22: FOTO 50%   COGNITION:           04/30/2022: Overall cognitive status: Within functional limits for tasks assessed                                  SENSATION: 04/30/2022: WFL   POSTURE: 04/30/2022: Rounded shoulders forward head   UPPER EXTREMITY ROM:    Active ROM Right Eval 04/30/2022:  sitting Left Eval 04/30/2022 sitting Left 05/14/22 supine Left 04/2722 supine Left 06/03/22 Supine passive Left 06/10/22 supine active  Shoulder flexion 155 70 120 124 130 125  Shoulder extension 45 40 40 42    Shoulder abduction 140 34 95 100 110 104  Shoulder adduction          Shoulder internal rotation 65 60      Shoulder external rotation 62 30   40 38  Elbow flexion 140 90 130 130  126  Elbow extension 0 -35 -35 -38  -36  Wrist flexion          Wrist extension  Wrist ulnar deviation          Wrist radial deviation          Wrist pronation          Wrist supination          (Blank rows = not tested)   UPPER EXTREMITY MMT:   MMT Right Eval 04/30/2022 Left Eval 04/30/2022 Left 06/10/22  Shoulder flexion 5/5 2/5 3+/5  Shoulder extension 5/5 2/5 3/5  Shoulder abduction 5/5 2/5 3/5  Shoulder adduction       Shoulder internal rotation 5/5 2/5 3/5  Shoulder external rotation 5/5 2/5 3/5  Middle trapezius       Lower trapezius       Elbow flexion 5/5 2/5 3+/5  Elbow extension 5/5 2/5 3+/5  Wrist flexion       Wrist extension       Wrist ulnar deviation       Wrist radial deviation       Wrist pronation       Wrist supination       Grip strength (lbs) 14.1 41.2   (Blank rows = not tested)         PALPATION:  TTP: left anterior and lateral  shoulder, lateral left elbow             TODAY'S TREATMENT:  06/10/2022:  TherEx: UBE: L2 4 minutes each direction  Standing rows: Level 2 band 3 x 15 ER: walking out and back x 10 Level 2 band Standing bicep curls 3 # 2 x 10 Left UE only Wall ladder x 5 Left UE flexion and abduction Lifting 1# weight from counter height to first shelf in clinic gym x 10 Standing AAROM flexion  2 # bar x 15 holding 3 seconds Standing AAROM ER  2 # bar x 15 holding 3 seconds  Manual PROM Left shoulder and Elbow, Grade 2-3 GH AP and inferior mobs    06/03/2022:  TherEx:  Standing rows: Level 2 band 3 x 15 Standing bicep curls Level 2 band 2 x 15 Wall ladder x 5 Left UE flexion and abduction Seated Rolling green physioball forward and back bilateral UE's x 10 Standing shoulder abduction AAROM: x 20 holding 3-5 seconds Door stretch at 60 degrees x 3 holding 30 seconds Standing AAROM flexion  2 # bar x 15 holding 3 seconds Standing AAROM ER  2 # bar x 15 holding 3 seconds Manual PROM Left shoulder and Elbow, Grade 2-3 GH AP and inferior mobs   05/27/2022:  TherEx:  Pulleys flexion: x 3 minutes, abduction x 2 minutes Standing rows: Level 2 band 3 x 15 Standing bicep curls 3# weight x 15  Wall ladder x 5 Left UE Seated Rolling green physioball forward and back bilateral UE's x 10 Standing shoulder abduction AAROM: x 20 holding 3-5 seconds Wall ladder abduction x 10 Door stretch at 60 degrees x 3 holding 30 seconds Supine AAROM flexion x 15 holding 3 seconds Supine AAROM ER x 15 holding 3 seconds Manual PROM Left shoulder and Elbow, Grade 2-3 GH AP and inferior mobs     PATIENT EDUCATION: Education details: PT POC, discussed re-certification with pt and husband, Person educated: Patient Education method: Consulting civil engineer, Media planner, Corporate treasurer cues, Verbal cues, and Handouts Education comprehension: verbalized understanding, returned demonstration, and verbal cues required     HOME EXERCISE  PROGRAM:   Access Code: [email or web address] URL: [email or web address] Date: 05/27/2022 Prepared by: Kearney Hard  Exercises - Seated Shoulder Flexion  Towel Slide at Table Top  - 3 x daily - 7 x weekly - 10 reps - 3-5 supi hold - Seated Shoulder Scaption Slide at Table Top with Forearm in Neutral  - 3 x daily - 7 x weekly - 10 reps - 3- 5 seconds hold - Seated Shoulder External Rotation PROM on Table  - 3 x daily - 7 x weekly - 10 reps - 3-5 seconds hold - Seated Scapular Retraction  - 3 x daily - 7 x weekly - 10 reps - 3-5 seconds hold - Doorway Pec Stretch at 60 Elevation  - 1-2 x daily - 7 x weekly - 3 reps - 30 seconds hold - Corner Pec Minor Stretch  - 1-2 x daily - 7 x weekly - 3 reps - 30 seocncs hold - Supine Shoulder Flexion Extension AAROM with Dowel  - 1-2 x daily - 7 x weekly - 10 reps - 5 seconds hold - Supine Shoulder External Rotation in 45 Degrees Abduction AAROM with Dowel  - 1-2 x daily - 7 x weekly - 10 reps - 5 seconds hold - Shoulder External Rotation and Scapular Retraction with Resistance  - 1-2 x daily - 7 x weekly - 2 sets - 10 reps - 3 seconds hold - Standing Row with Anchored Resistance  - 1-2 x daily - 7 x weekly - 2 sets - 10 reps - 3 seconds hold ASSESSMENT:   CLINICAL IMPRESSION: Pt reporting 6/10 pain today in left shoulder, Pt stating her elbow is feeling better. Pt has improved her left shoulder ROM. Pt has also improved her left shoulder strength to grossly 3/5 and left elbow strength to 3+/5 in order to improve functional mobility. Pt has only been able to attend 6 of her 12 therapy visits due to difficulty getting an appointment and with vacations and holidays. I am requesting 6 additional weeks of therapy 1x/ week.  Continue skilled PT to progress toward pt's LTG's.      OBJECTIVE IMPAIRMENTS Abnormal gait, decreased activity tolerance, decreased mobility, decreased ROM, decreased strength, increased edema, impaired flexibility, impaired UE  functional use, and pain.    ACTIVITY LIMITATIONS carrying, lifting, bed mobility, bathing, toileting, dressing, and reach over head   PARTICIPATION LIMITATIONS: cleaning, laundry, shopping, and community activity   PERSONAL FACTORS back pain, DM, heart mumur, sleep apnea,  are also affecting patient's functional outcome.    REHAB POTENTIAL: Fair to good   CLINICAL DECISION MAKING: Stable/uncomplicated   EVALUATION COMPLEXITY: moderate due to pt's PTSD and anxiety as well as multiple joint involvement     GOALS: Goals reviewed with patient? Yes   SHORT TERM GOALS: Target date: 05/21/2022  (Remove Blue Hyperlink)   Patient will demonstrate independent use of initial home exercise program to maintain progress from in clinic treatments. Goal status: MET 05/21/2022   Long term PT goals (target dates for all long term goals are 6 weeks  06/14/2022 ) Patient will demonstrate/report pain at worst less than or equal to 2/10 to facilitate minimal limitation in daily activity secondary to pain symptoms. Goal status: ongoing 06/10/22   Patient will demonstrate independent use of home exercise program to facilitate ability to maintain/progress functional gains from skilled physical therapy services. Goal status: On-going 06/10/22   Patient will demonstrate FOTO outcome > or = 65 % to indicate reduced disability due to condition. Goal status: On-going 06/10/22   Pt will improve her Left active shoulder flexion to >/= 120 degrees. Goal status: on-going 06/10/22  5.  Pt  will improve her left UE strength to >/= 3/5 for improved functional mobility and ADL's.   Goal status: MET 06/10/22       PLAN: PT FREQUENCY: 1x/week (re-cert sent on 7/93/9030)   PT DURATION: 6 weeks   PLANNED INTERVENTIONS: Therapeutic exercises, Therapeutic activity, Neuromuscular re-education, Balance training, Gait training, Patient/Family education, Joint mobilization, Dry Needling, Cryotherapy, Moist heat,  Taping, Vasopneumatic device, and Manual therapy   PLAN FOR NEXT SESSION: continue shoulder ROM, gentle strengthening as tolerated      Oretha Caprice, PT, MPT 06/10/2022, 1:43 PM

## 2022-06-12 DIAGNOSIS — F4312 Post-traumatic stress disorder, chronic: Secondary | ICD-10-CM | POA: Diagnosis not present

## 2022-06-12 DIAGNOSIS — F332 Major depressive disorder, recurrent severe without psychotic features: Secondary | ICD-10-CM | POA: Diagnosis not present

## 2022-06-12 DIAGNOSIS — F411 Generalized anxiety disorder: Secondary | ICD-10-CM | POA: Diagnosis not present

## 2022-06-17 ENCOUNTER — Encounter: Payer: Self-pay | Admitting: Physical Therapy

## 2022-06-17 ENCOUNTER — Ambulatory Visit (INDEPENDENT_AMBULATORY_CARE_PROVIDER_SITE_OTHER): Payer: Medicare PPO | Admitting: Physical Therapy

## 2022-06-17 DIAGNOSIS — R600 Localized edema: Secondary | ICD-10-CM

## 2022-06-17 DIAGNOSIS — M6281 Muscle weakness (generalized): Secondary | ICD-10-CM

## 2022-06-17 DIAGNOSIS — M25522 Pain in left elbow: Secondary | ICD-10-CM | POA: Diagnosis not present

## 2022-06-17 DIAGNOSIS — M25612 Stiffness of left shoulder, not elsewhere classified: Secondary | ICD-10-CM | POA: Diagnosis not present

## 2022-06-17 DIAGNOSIS — M25622 Stiffness of left elbow, not elsewhere classified: Secondary | ICD-10-CM | POA: Diagnosis not present

## 2022-06-17 DIAGNOSIS — M25512 Pain in left shoulder: Secondary | ICD-10-CM

## 2022-06-17 NOTE — Therapy (Signed)
OUTPATIENT PHYSICAL THERAPY TREATMENT NOTE    Patient Name: Meagan Moran MRN: [id number] DOB:04-29-1956, 65 y.o., female Today's Date: 06/17/2022  PCP: Carol Ada MD REFERRING PROVIDER: Mcarthur Rossetti, MD  END OF SESSION:   PT End of Session - 06/17/22 1309     Visit Number 7    Number of Visits 12    Date for PT Re-Evaluation 06/14/22    PT Start Time 0973    PT Stop Time 1345    PT Time Calculation (min) 40 min    Activity Tolerance Patient tolerated treatment well    Behavior During Therapy WFL for tasks assessed/performed                 Past Medical History:  Diagnosis Date   Back pain    Diabetes mellitus, type II ([place])    Heart murmur    Sleep apnea    Past Surgical History:  Procedure Laterality Date   CESAREAN SECTION     Patient Active Problem List   Diagnosis Date Noted   Type 2 diabetes mellitus with hyperglycemia, with long-term current use of insulin ([place]) 08/05/2019    REFERRING DIAG: Z32.992E (ICD-10-CM) - Humeral head fracture, left, closed, initial encounter S42.402A (ICD-10-CM) - Elbow fracture, left, closed, initial encounter  THERAPY DIAG:  Acute pain of left shoulder  Pain in left elbow  Muscle weakness (generalized)  Stiffness of left shoulder, not elsewhere classified  Stiffness of left elbow, not elsewhere classified  Localized edema  Rationale for Evaluation and Treatment Rehabilitation  PERTINENT HISTORY: back pain, DM, heart mumur, sleep apnea, PTSD, anxiety   PRECAUTIONS: none  SUBJECTIVE: Pt stating 6/10 pain in left shoulder and left elbow.  PAIN:  Are you having pain? Yes: NPRS scale: 6/10 Pain location: left shoulder and elbow Pain description: soreness, tightness,  Aggravating factors: reaching, lifting Relieving factors: resting   OBJECTIVE: (objective measures completed at initial evaluation unless otherwise dated) DIAGNOSTIC FINDINGS:  3 views of the left shoulder show well located  shoulder.  There is a  healing proximal humerus fracture there is a 2 part fracture with the  humeral shaft impacted into the head.  The head is well located within the  glenohumeral joint.  There has been significant interval healing from the  past x-rays comparison.   PATIENT SURVEYS:  04/30/22: FOTO 59% (predicted 65%) 06/10/22: FOTO 50%   COGNITION:           04/30/2022: Overall cognitive status: Within functional limits for tasks assessed                                  SENSATION: 04/30/2022: WFL   POSTURE: 04/30/2022: Rounded shoulders forward head   UPPER EXTREMITY ROM:    Active ROM Right Eval 04/30/2022:  sitting Left Eval 04/30/2022 sitting Left 05/14/22 supine Left 04/2722 supine Left 06/03/22 Supine passive Left 06/10/22 supine active  Shoulder flexion 155 70 120 124 130 125  Shoulder extension 45 40 40 42    Shoulder abduction 140 34 95 100 110 104  Shoulder adduction          Shoulder internal rotation 65 60      Shoulder external rotation 62 30   40 38  Elbow flexion 140 90 130 130  126  Elbow extension 0 -35 -35 -38  -36  Wrist flexion          Wrist extension  Wrist ulnar deviation          Wrist radial deviation          Wrist pronation          Wrist supination          (Blank rows = not tested)   UPPER EXTREMITY MMT:   MMT Right Eval 04/30/2022 Left Eval 04/30/2022 Left 06/10/22  Shoulder flexion 5/5 2/5 3+/5  Shoulder extension 5/5 2/5 3/5  Shoulder abduction 5/5 2/5 3/5  Shoulder adduction       Shoulder internal rotation 5/5 2/5 3/5  Shoulder external rotation 5/5 2/5 3/5  Middle trapezius       Lower trapezius       Elbow flexion 5/5 2/5 3+/5  Elbow extension 5/5 2/5 3+/5  Wrist flexion       Wrist extension       Wrist ulnar deviation       Wrist radial deviation       Wrist pronation       Wrist supination       Grip strength (lbs) 14.1 41.2   (Blank rows = not tested)         PALPATION:  TTP: left anterior and lateral  shoulder, lateral left elbow             TODAY'S TREATMENT:  06/17/2022:  TherEx: UBE: L2 4 minutes each direction  BATCA: rows: 15# 3 x 10 BATCA: lat pull downs 15# 3 x 10 BATCA: ER: walking out and back x 10 5 # BATCA: bicep curls 5# 2 x 10 bilateral UE's  Wall ladder x 5 Left UE flexion (# 22) and abduction (#20) "Money" ER using Level 3 band Standing AAROM flexion  2 # bar x 15 holding 3 seconds (cues to prevent left shoulder hiking) Standing AAROM abduction 2 # bar x 15 holding 3 seconds  Manual PROM Left shoulder and Elbow, Grade 2-3 GH AP and inferior mobs c moist heat on posterior shoulder.    06/10/2022:  TherEx: UBE: L2 4 minutes each direction  Standing rows: Level 2 band 3 x 15 ER: walking out and back x 10 Level 2 band Standing bicep curls 3 # 2 x 10 Left UE only Wall ladder x 5 Left UE flexion and abduction Lifting 1# weight from counter height to first shelf in clinic gym x 10 Standing AAROM flexion  2 # bar x 15 holding 3 seconds Standing AAROM ER  2 # bar x 15 holding 3 seconds  Manual PROM Left shoulder and Elbow, Grade 2-3 GH AP and inferior mobs    06/03/2022:  TherEx:  Standing rows: Level 2 band 3 x 15 Standing bicep curls Level 2 band 2 x 15 Wall ladder x 5 Left UE flexion and abduction Seated Rolling green physioball forward and back bilateral UE's x 10 Standing shoulder abduction AAROM: x 20 holding 3-5 seconds Door stretch at 60 degrees x 3 holding 30 seconds Standing AAROM flexion  2 # bar x 15 holding 3 seconds Standing AAROM ER  2 # bar x 15 holding 3 seconds Manual PROM Left shoulder and Elbow, Grade 2-3 GH AP and inferior mobs        PATIENT EDUCATION: Education details: PT POC, discussed re-certification with pt and husband, Person educated: Patient Education method: Consulting civil engineer, Media planner, Corporate treasurer cues, Verbal cues, and Handouts Education comprehension: verbalized understanding, returned demonstration, and verbal cues  required     HOME EXERCISE PROGRAM:   Access  Code: [email or web address] URL: [email or web address] Date: 05/27/2022 Prepared by: Kearney Hard  Exercises - Seated Shoulder Flexion Towel Slide at Table Top  - 3 x daily - 7 x weekly - 10 reps - 3-5 supi hold - Seated Shoulder Scaption Slide at Table Top with Forearm in Neutral  - 3 x daily - 7 x weekly - 10 reps - 3- 5 seconds hold - Seated Shoulder External Rotation PROM on Table  - 3 x daily - 7 x weekly - 10 reps - 3-5 seconds hold - Seated Scapular Retraction  - 3 x daily - 7 x weekly - 10 reps - 3-5 seconds hold - Doorway Pec Stretch at 60 Elevation  - 1-2 x daily - 7 x weekly - 3 reps - 30 seconds hold - Corner Pec Minor Stretch  - 1-2 x daily - 7 x weekly - 3 reps - 30 seocncs hold - Supine Shoulder Flexion Extension AAROM with Dowel  - 1-2 x daily - 7 x weekly - 10 reps - 5 seconds hold - Supine Shoulder External Rotation in 45 Degrees Abduction AAROM with Dowel  - 1-2 x daily - 7 x weekly - 10 reps - 5 seconds hold - Shoulder External Rotation and Scapular Retraction with Resistance  - 1-2 x daily - 7 x weekly - 2 sets - 10 reps - 3 seconds hold - Standing Row with Anchored Resistance  - 1-2 x daily - 7 x weekly - 2 sets - 10 reps - 3 seconds hold ASSESSMENT:   CLINICAL IMPRESSION: Pt reporting 6/10 pain today in left shoulder and elbow today. Pt tolerating exercises well with no reports of increased pain. Continue skilled PT toward goals set.      OBJECTIVE IMPAIRMENTS Abnormal gait, decreased activity tolerance, decreased mobility, decreased ROM, decreased strength, increased edema, impaired flexibility, impaired UE functional use, and pain.    ACTIVITY LIMITATIONS carrying, lifting, bed mobility, bathing, toileting, dressing, and reach over head   PARTICIPATION LIMITATIONS: cleaning, laundry, shopping, and community activity   PERSONAL FACTORS back pain, DM, heart mumur, sleep apnea,  are also affecting patient's  functional outcome.    REHAB POTENTIAL: Fair to good   CLINICAL DECISION MAKING: Stable/uncomplicated   EVALUATION COMPLEXITY: moderate due to pt's PTSD and anxiety as well as multiple joint involvement     GOALS: Goals reviewed with patient? Yes   SHORT TERM GOALS: Target date: 05/21/2022  (Remove Blue Hyperlink)   Patient will demonstrate independent use of initial home exercise program to maintain progress from in clinic treatments. Goal status: MET 05/21/2022   Long term PT goals (target dates for all long term goals are 6 weeks  06/14/2022 ) Patient will demonstrate/report pain at worst less than or equal to 2/10 to facilitate minimal limitation in daily activity secondary to pain symptoms. Goal status: ongoing 06/10/22   Patient will demonstrate independent use of home exercise program to facilitate ability to maintain/progress functional gains from skilled physical therapy services. Goal status: On-going 06/17/22   Patient will demonstrate FOTO outcome > or = 65 % to indicate reduced disability due to condition. Goal status: On-going 06/10/22   Pt will improve her Left active shoulder flexion to >/= 120 degrees. Goal status: on-going 06/10/22        5.  Pt  will improve her left UE strength to >/= 3/5 for improved functional mobility and ADL's.   Goal status: MET 06/10/22       PLAN: PT FREQUENCY: 1x/week (re-cert sent on 01/14/2541)  PT DURATION: 6 weeks   PLANNED INTERVENTIONS: Therapeutic exercises, Therapeutic activity, Neuromuscular re-education, Balance training, Gait training, Patient/Family education, Joint mobilization, Dry Needling, Cryotherapy, Moist heat, Taping, Vasopneumatic device, and Manual therapy   PLAN FOR NEXT SESSION: continue shoulder ROM, gentle strengthening as tolerated      Oretha Caprice, PT, MPT 06/17/2022, 1:11 PM

## 2022-06-19 DIAGNOSIS — F411 Generalized anxiety disorder: Secondary | ICD-10-CM | POA: Diagnosis not present

## 2022-06-19 DIAGNOSIS — F332 Major depressive disorder, recurrent severe without psychotic features: Secondary | ICD-10-CM | POA: Diagnosis not present

## 2022-06-19 DIAGNOSIS — F4312 Post-traumatic stress disorder, chronic: Secondary | ICD-10-CM | POA: Diagnosis not present

## 2022-06-24 ENCOUNTER — Encounter: Payer: Self-pay | Admitting: Physical Therapy

## 2022-06-24 ENCOUNTER — Ambulatory Visit (INDEPENDENT_AMBULATORY_CARE_PROVIDER_SITE_OTHER): Payer: Medicare PPO | Admitting: Physical Therapy

## 2022-06-24 DIAGNOSIS — M25512 Pain in left shoulder: Secondary | ICD-10-CM | POA: Diagnosis not present

## 2022-06-24 DIAGNOSIS — M6281 Muscle weakness (generalized): Secondary | ICD-10-CM | POA: Diagnosis not present

## 2022-06-24 DIAGNOSIS — R600 Localized edema: Secondary | ICD-10-CM | POA: Diagnosis not present

## 2022-06-24 DIAGNOSIS — M25522 Pain in left elbow: Secondary | ICD-10-CM

## 2022-06-24 DIAGNOSIS — M25612 Stiffness of left shoulder, not elsewhere classified: Secondary | ICD-10-CM

## 2022-06-24 DIAGNOSIS — M25622 Stiffness of left elbow, not elsewhere classified: Secondary | ICD-10-CM

## 2022-06-24 NOTE — Therapy (Signed)
OUTPATIENT PHYSICAL THERAPY TREATMENT NOTE RE-CERTIFICATION    Patient Name: Meagan Moran MRN: [id number] DOB:May 30, 1956, 66 y.o., female Today's Date: 06/24/2022  PCP: Carol Ada MD REFERRING PROVIDER: Mcarthur Rossetti, MD  END OF SESSION:   PT End of Session - 06/24/22 1336     Visit Number 8    Number of Visits 12    Date for PT Re-Evaluation 07/26/22    PT Start Time 1300    PT Stop Time 1340    PT Time Calculation (min) 40 min    Activity Tolerance Patient tolerated treatment well    Behavior During Therapy WFL for tasks assessed/performed                 Past Medical History:  Diagnosis Date   Back pain    Diabetes mellitus, type II ([place])    Heart murmur    Sleep apnea    Past Surgical History:  Procedure Laterality Date   CESAREAN SECTION     Patient Active Problem List   Diagnosis Date Noted   Type 2 diabetes mellitus with hyperglycemia, with long-term current use of insulin ([place]) 08/05/2019    REFERRING DIAG: J67.341P (ICD-10-CM) - Humeral head fracture, left, closed, initial encounter S42.402A (ICD-10-CM) - Elbow fracture, left, closed, initial encounter  THERAPY DIAG:  Acute pain of left shoulder - Plan: PT plan of care cert/re-cert  Pain in left elbow - Plan: PT plan of care cert/re-cert  Muscle weakness (generalized) - Plan: PT plan of care cert/re-cert  Stiffness of left shoulder, not elsewhere classified - Plan: PT plan of care cert/re-cert  Stiffness of left elbow, not elsewhere classified - Plan: PT plan of care cert/re-cert  Localized edema - Plan: PT plan of care cert/re-cert  Rationale for Evaluation and Treatment Rehabilitation  PERTINENT HISTORY: back pain, DM, heart mumur, sleep apnea, PTSD, anxiety   PRECAUTIONS: none  SUBJECTIVE: Pt stating 6/10 pain in left shoulder and left elbow.  PAIN:  Are you having pain? Yes: NPRS scale: 6/10 Pain location: left shoulder and elbow Pain description: soreness,  tightness,  Aggravating factors: reaching, lifting Relieving factors: resting   OBJECTIVE: (objective measures completed at initial evaluation unless otherwise dated) DIAGNOSTIC FINDINGS:  3 views of the left shoulder show well located shoulder.  There is a  healing proximal humerus fracture there is a 2 part fracture with the  humeral shaft impacted into the head.  The head is well located within the  glenohumeral joint.  There has been significant interval healing from the  past x-rays comparison.   PATIENT SURVEYS:  04/30/22: FOTO 59% (predicted 65%) 06/10/22: FOTO 50%   COGNITION:           04/30/2022: Overall cognitive status: Within functional limits for tasks assessed                                  SENSATION: 04/30/2022: WFL   POSTURE: 04/30/2022: Rounded shoulders forward head   UPPER EXTREMITY ROM:    Active ROM Right Eval 04/30/2022:  sitting Left Eval 04/30/2022 sitting Left 05/14/22 supine Left 04/2722 supine Left 06/03/22 Supine passive Left 06/10/22 supine active Left 06/24/22 Supine active  Shoulder flexion 155 70 120 124 130 125 125  Shoulder extension 45 40 40 42     Shoulder abduction 140 34 95 100 110 104 105  Shoulder adduction           Shoulder  internal rotation 65 60       Shoulder external rotation 62 30   40 38 40   Elbow flexion 140 90 130 130  126 125  Elbow extension 0 -35 -35 -38  -36 -35  Wrist flexion           Wrist extension           Wrist ulnar deviation           Wrist radial deviation           Wrist pronation           Wrist supination           (Blank rows = not tested)   UPPER EXTREMITY MMT:   MMT Right Eval 04/30/2022 Left Eval 04/30/2022 Left 06/10/22  Shoulder flexion 5/5 2/5 3+/5  Shoulder extension 5/5 2/5 3/5  Shoulder abduction 5/5 2/5 3/5  Shoulder adduction       Shoulder internal rotation 5/5 2/5 3/5  Shoulder external rotation 5/5 2/5 3/5  Middle trapezius       Lower trapezius       Elbow flexion 5/5 2/5  3+/5  Elbow extension 5/5 2/5 3+/5  Wrist flexion       Wrist extension       Wrist ulnar deviation       Wrist radial deviation       Wrist pronation       Wrist supination       Grip strength (lbs) 14.1 41.2   (Blank rows = not tested)         PALPATION:  TTP: left anterior and lateral shoulder, lateral left elbow 06/24/22: TTP: posterior shoulder joint, Rt levator scapulae             TODAY'S TREATMENT:   06/24/2022:  TherEx: UBE: L2.5 x  4 minutes each direction  BATCA: rows: 15# 3 x 10 BATCA: lat pull downs 10 # 3 x 10 BATCA: bicep curls 5# 2 x 10 bilateral UE's  Wall push ups x 15  Standing AAROM flexion  2 # bar x 15 holding 3 seconds (cues to prevent left shoulder hiking) Standing AAROM abduction 2 # bar x 15 holding 3 seconds  Manual PROM Left shoulder and Elbow, Grade 2-3 GH AP and inferior mobs c moist heat on posterior shoulder.   06/17/2022:  TherEx: UBE: L2 4 minutes each direction  BATCA: rows: 15# 3 x 10 BATCA: lat pull downs 15# 3 x 10 BATCA: ER: walking out and back x 10 5 # BATCA: bicep curls 5# 2 x 10 bilateral UE's  Wall ladder x 5 Left UE flexion (# 22) and abduction (#20) "Money" ER using Level 3 band Standing AAROM flexion  2 # bar x 15 holding 3 seconds (cues to prevent left shoulder hiking) Standing AAROM abduction 2 # bar x 15 holding 3 seconds  Manual PROM Left shoulder and Elbow, Grade 2-3 GH AP and inferior mobs c moist heat on posterior shoulder.    06/10/2022:  TherEx: UBE: L2 4 minutes each direction  Standing rows: Level 2 band 3 x 15 ER: walking out and back x 10 Level 2 band Standing bicep curls 3 # 2 x 10 Left UE only Wall ladder x 5 Left UE flexion and abduction Lifting 1# weight from counter height to first shelf in clinic gym x 10 Standing AAROM flexion  2 # bar x 15 holding 3 seconds Standing AAROM ER  2 #  bar x 15 holding 3 seconds  Manual PROM Left shoulder and Elbow, Grade 2-3 GH AP and inferior  mobs        PATIENT EDUCATION: Education details: PT POC, discussed re-certification with pt and husband, Person educated: Patient Education method: Explanation, Demonstration, Tactile cues, Verbal cues, and Handouts Education comprehension: verbalized understanding, returned demonstration, and verbal cues required     HOME EXERCISE PROGRAM:   Access Code: [email or web address] URL: [email or web address] Date: 05/27/2022 Prepared by: Kearney Hard  Exercises - Seated Shoulder Flexion Towel Slide at Table Top  - 3 x daily - 7 x weekly - 10 reps - 3-5 supi hold - Seated Shoulder Scaption Slide at Table Top with Forearm in Neutral  - 3 x daily - 7 x weekly - 10 reps - 3- 5 seconds hold - Seated Shoulder External Rotation PROM on Table  - 3 x daily - 7 x weekly - 10 reps - 3-5 seconds hold - Seated Scapular Retraction  - 3 x daily - 7 x weekly - 10 reps - 3-5 seconds hold - Doorway Pec Stretch at 60 Elevation  - 1-2 x daily - 7 x weekly - 3 reps - 30 seconds hold - Corner Pec Minor Stretch  - 1-2 x daily - 7 x weekly - 3 reps - 30 seocncs hold - Supine Shoulder Flexion Extension AAROM with Dowel  - 1-2 x daily - 7 x weekly - 10 reps - 5 seconds hold - Supine Shoulder External Rotation in 45 Degrees Abduction AAROM with Dowel  - 1-2 x daily - 7 x weekly - 10 reps - 5 seconds hold - Shoulder External Rotation and Scapular Retraction with Resistance  - 1-2 x daily - 7 x weekly - 2 sets - 10 reps - 3 seconds hold - Standing Row with Anchored Resistance  - 1-2 x daily - 7 x weekly - 2 sets - 10 reps - 3 seconds hold ASSESSMENT:   CLINICAL IMPRESSION: Pt arriving today reporting 6/10 pain. Pt with tightness noted in left shoulder joint capsule and hard end range in left elbow. Pt tolerating all exercises. I am submitting for an additional 6 weeks to continue to progress toward more functional mobility and LTG's set.  Continue skilled PT toward goals set.      OBJECTIVE IMPAIRMENTS  Abnormal gait, decreased activity tolerance, decreased mobility, decreased ROM, decreased strength, increased edema, impaired flexibility, impaired UE functional use, and pain.    ACTIVITY LIMITATIONS carrying, lifting, bed mobility, bathing, toileting, dressing, and reach over head   PARTICIPATION LIMITATIONS: cleaning, laundry, shopping, and community activity   PERSONAL FACTORS back pain, DM, heart mumur, sleep apnea,  are also affecting patient's functional outcome.    REHAB POTENTIAL: Fair to good   CLINICAL DECISION MAKING: Stable/uncomplicated   EVALUATION COMPLEXITY: moderate due to pt's PTSD and anxiety as well as multiple joint involvement     GOALS: Goals reviewed with patient? Yes   SHORT TERM GOALS: Target date: 05/21/2022  (Remove Blue Hyperlink)   Patient will demonstrate independent use of initial home exercise program to maintain progress from in clinic treatments. Goal status: MET 05/21/2022   Long term PT goals (target dates for all long term goals are 6 weeks  06/14/2022 ) Patient will demonstrate/report pain at worst less than or equal to 2/10 to facilitate minimal limitation in daily activity secondary to pain symptoms. Goal status: ongoing 06/10/22   Patient will demonstrate independent use of home exercise program to facilitate ability to maintain/progress  functional gains from skilled physical therapy services. Goal status: On-going 06/17/22   Patient will demonstrate FOTO outcome > or = 65 % to indicate reduced disability due to condition. Goal status: On-going 06/10/22   Pt will improve her Left active shoulder flexion to >/= 120 degrees. Goal status: on-going 06/10/22        5.  Pt  will improve her left UE strength to >/= 3/5 for improved functional mobility and ADL's.   Goal status: MET 06/10/22       PLAN: PT FREQUENCY: 1x/week (re-cert sent on 2/41/9914)   PT DURATION: 6 weeks   PLANNED INTERVENTIONS: Therapeutic exercises, Therapeutic activity,  Neuromuscular re-education, Balance training, Gait training, Patient/Family education, Joint mobilization, Dry Needling, Cryotherapy, Moist heat, Taping, Vasopneumatic device, and Manual therapy   PLAN FOR NEXT SESSION: continue shoulder ROM, gentle strengthening as tolerated      Oretha Caprice, PT, MPT 06/24/2022, 3:46 PM

## 2022-06-26 DIAGNOSIS — F411 Generalized anxiety disorder: Secondary | ICD-10-CM | POA: Diagnosis not present

## 2022-06-26 DIAGNOSIS — F4312 Post-traumatic stress disorder, chronic: Secondary | ICD-10-CM | POA: Diagnosis not present

## 2022-06-26 DIAGNOSIS — F332 Major depressive disorder, recurrent severe without psychotic features: Secondary | ICD-10-CM | POA: Diagnosis not present

## 2022-07-01 ENCOUNTER — Encounter: Payer: Self-pay | Admitting: Physical Therapy

## 2022-07-01 ENCOUNTER — Ambulatory Visit (INDEPENDENT_AMBULATORY_CARE_PROVIDER_SITE_OTHER): Payer: Medicare PPO | Admitting: Physical Therapy

## 2022-07-01 DIAGNOSIS — R600 Localized edema: Secondary | ICD-10-CM | POA: Diagnosis not present

## 2022-07-01 DIAGNOSIS — M25512 Pain in left shoulder: Secondary | ICD-10-CM | POA: Diagnosis not present

## 2022-07-01 DIAGNOSIS — M25522 Pain in left elbow: Secondary | ICD-10-CM

## 2022-07-01 DIAGNOSIS — M25622 Stiffness of left elbow, not elsewhere classified: Secondary | ICD-10-CM | POA: Diagnosis not present

## 2022-07-01 DIAGNOSIS — M6281 Muscle weakness (generalized): Secondary | ICD-10-CM

## 2022-07-01 DIAGNOSIS — M25612 Stiffness of left shoulder, not elsewhere classified: Secondary | ICD-10-CM

## 2022-07-01 NOTE — Therapy (Addendum)
OUTPATIENT PHYSICAL THERAPY TREATMENT NOTE     Patient Name: Meagan Moran MRN: [id number] DOB:[birth year], 66 y.o., female Today's Date: 07/01/2022  PCP: Carol Ada MD REFERRING PROVIDER: Mcarthur Rossetti, MD  END OF SESSION:   PT End of Session - 07/01/22 1351     Visit Number 9    Number of Visits 12    Date for PT Re-Evaluation 07/26/22    PT Start Time 6237    PT Stop Time 1345    PT Time Calculation (min) 40 min    Activity Tolerance Patient tolerated treatment well    Behavior During Therapy WFL for tasks assessed/performed                  Past Medical History:  Diagnosis Date   Back pain    Diabetes mellitus, type II ([place])    Heart murmur    Sleep apnea    Past Surgical History:  Procedure Laterality Date   CESAREAN SECTION     Patient Active Problem List   Diagnosis Date Noted   Type 2 diabetes mellitus with hyperglycemia, with long-term current use of insulin ([place]) 08/05/2019    REFERRING DIAG: S28.315V (ICD-10-CM) - Humeral head fracture, left, closed, initial encounter S42.402A (ICD-10-CM) - Elbow fracture, left, closed, initial encounter  THERAPY DIAG:  Acute pain of left shoulder  Pain in left elbow  Muscle weakness (generalized)  Stiffness of left shoulder, not elsewhere classified  Stiffness of left elbow, not elsewhere classified  Localized edema  Rationale for Evaluation and Treatment Rehabilitation  PERTINENT HISTORY: back pain, DM, heart mumur, sleep apnea, PTSD, anxiety   PRECAUTIONS: none  SUBJECTIVE: Pt arriving today reporting 5/10 pain in both shoulder and elbow on her left.   PAIN:  Are you having pain? Yes: NPRS scale: 5/10 Pain location: left shoulder and elbow Pain description: soreness, tightness,  Aggravating factors: reaching, lifting Relieving factors: resting   OBJECTIVE: (objective measures completed at initial evaluation unless otherwise dated) DIAGNOSTIC FINDINGS:  3 views of the left  shoulder show well located shoulder.  There is a  healing proximal humerus fracture there is a 2 part fracture with the  humeral shaft impacted into the head.  The head is well located within the  glenohumeral joint.  There has been significant interval healing from the  past x-rays comparison.   PATIENT SURVEYS:  04/30/22: FOTO 59% (predicted 65%) 06/10/22: FOTO 50%   COGNITION:           04/30/2022: Overall cognitive status: Within functional limits for tasks assessed                                  SENSATION: 04/30/2022: WFL   POSTURE: 04/30/2022: Rounded shoulders forward head   UPPER EXTREMITY ROM:    Active ROM Right Eval 04/30/2022:  sitting Left Eval 04/30/2022 sitting Left 05/14/22 supine Left 04/2722 supine Left 06/03/22 Supine passive Left 06/10/22 supine active Left 06/24/22 Supine active  Shoulder flexion 155 70 120 124 130 125 125  Shoulder extension 45 40 40 42     Shoulder abduction 140 34 95 100 110 104 105  Shoulder adduction           Shoulder internal rotation 65 60       Shoulder external rotation 62 30   40 38 40   Elbow flexion 140 90 130 130  126 125  Elbow extension 0 -35 -  35 -38  -36 -35  Wrist flexion           Wrist extension           Wrist ulnar deviation           Wrist radial deviation           Wrist pronation           Wrist supination           (Blank rows = not tested)   UPPER EXTREMITY MMT:   MMT Right Eval 04/30/2022 Left Eval 04/30/2022 Left 06/10/22 Left 07/01/22  Shoulder flexion 5/5 2/5 3+/5 3+/5  Shoulder extension 5/5 2/5 3/5 3+/5  Shoulder abduction 5/5 2/5 3/5 3+/5  Shoulder adduction        Shoulder internal rotation 5/5 2/5 3/5 3+/5  Shoulder external rotation 5/5 2/5 3/5 3+/5  Middle trapezius        Lower trapezius        Elbow flexion 5/5 2/5 3+/5 3+/5  Elbow extension 5/5 2/5 3+/5 3+/5  Wrist flexion        Wrist extension        Wrist ulnar deviation        Wrist radial deviation        Wrist pronation         Wrist supination        Grip strength (lbs) 14.1 41.2  32.0 ppsi  (Blank rows = not tested)         PALPATION:  TTP: left anterior and lateral shoulder, lateral left elbow 06/24/22: TTP: posterior shoulder joint, Rt levator scapulae             TODAY'S TREATMENT:  8/7//2023:  TherEx: UBE: L2.5 x  3 minutes each direction  BATCA: rows: 15# 3 x 10 BATCA: lat pull downs 10 # 3 x 10 BATCA: bicep curls 5# 2 x 10 bilateral UE's  Lifting small cone on 1st gym shelf x 10, cone on 2nd shelf x 10, 1 # weight x 10 on 1st and 2nd shelf and then 2# weight x 10 on 2nd shelf.  Standing AAROM flexion  2 # bar x 15 holding 3 seconds (cues to prevent left shoulder hiking) Standing AAROM abduction 2 # bar x 15 holding 3 seconds Wall ladder: flexion x 5 (up to #21) Wall ladder scaption x 5 (up to #16)    06/24/2022:  TherEx: UBE: L2.5 x  4 minutes each direction  BATCA: rows: 15# 3 x 10 BATCA: lat pull downs 10 # 3 x 10 BATCA: bicep curls 5# 2 x 10 bilateral UE's  Wall push ups x 15  Standing AAROM flexion  2 # bar x 15 holding 3 seconds (cues to prevent left shoulder hiking) Standing AAROM abduction 2 # bar x 15 holding 3 seconds  Manual PROM Left shoulder and Elbow, Grade 2-3 GH AP and inferior mobs c moist heat on posterior shoulder.   06/17/2022:  TherEx: UBE: L2 4 minutes each direction  BATCA: rows: 15# 3 x 10 BATCA: lat pull downs 15# 3 x 10 BATCA: ER: walking out and back x 10 5 # BATCA: bicep curls 5# 2 x 10 bilateral UE's  Wall ladder x 5 Left UE flexion (# 22) and abduction (#20) "Money" ER using Level 3 band Standing AAROM flexion  2 # bar x 15 holding 3 seconds (cues to prevent left shoulder hiking) Standing AAROM abduction 2 # bar x 15 holding 3 seconds  Manual PROM Left shoulder and Elbow, Grade 2-3 GH AP and inferior mobs c moist heat on posterior shoulder.            PATIENT EDUCATION: Education details: PT POC, discussed re-certification with pt and  husband, Person educated: Patient Education method: Explanation, Demonstration, Tactile cues, Verbal cues, and Handouts Education comprehension: verbalized understanding, returned demonstration, and verbal cues required     HOME EXERCISE PROGRAM:   Access Code: [email or web address] URL: [email or web address] Date: 05/27/2022 Prepared by: Kearney Hard  Exercises - Seated Shoulder Flexion Towel Slide at Table Top  - 3 x daily - 7 x weekly - 10 reps - 3-5 supi hold - Seated Shoulder Scaption Slide at Table Top with Forearm in Neutral  - 3 x daily - 7 x weekly - 10 reps - 3- 5 seconds hold - Seated Shoulder External Rotation PROM on Table  - 3 x daily - 7 x weekly - 10 reps - 3-5 seconds hold - Seated Scapular Retraction  - 3 x daily - 7 x weekly - 10 reps - 3-5 seconds hold - Doorway Pec Stretch at 60 Elevation  - 1-2 x daily - 7 x weekly - 3 reps - 30 seconds hold - Corner Pec Minor Stretch  - 1-2 x daily - 7 x weekly - 3 reps - 30 seocncs hold - Supine Shoulder Flexion Extension AAROM with Dowel  - 1-2 x daily - 7 x weekly - 10 reps - 5 seconds hold - Supine Shoulder External Rotation in 45 Degrees Abduction AAROM with Dowel  - 1-2 x daily - 7 x weekly - 10 reps - 5 seconds hold - Shoulder External Rotation and Scapular Retraction with Resistance  - 1-2 x daily - 7 x weekly - 2 sets - 10 reps - 3 seconds hold - Standing Row with Anchored Resistance  - 1-2 x daily - 7 x weekly - 2 sets - 10 reps - 3 seconds hold ASSESSMENT:   CLINICAL IMPRESSION: Pt arriving today reporting 5/10 pain.Pt's husband stating that he has noticed that pt has been using her arm for more ADL's. Pt able to place 2# weight on clinic gym shelf. Pt needed instructions to control her movements with each exercise. Continue skilled PT toward goals set.      OBJECTIVE IMPAIRMENTS Abnormal gait, decreased activity tolerance, decreased mobility, decreased ROM, decreased strength, increased edema, impaired flexibility,  impaired UE functional use, and pain.    ACTIVITY LIMITATIONS carrying, lifting, bed mobility, bathing, toileting, dressing, and reach over head   PARTICIPATION LIMITATIONS: cleaning, laundry, shopping, and community activity   PERSONAL FACTORS back pain, DM, heart mumur, sleep apnea,  are also affecting patient's functional outcome.    REHAB POTENTIAL: Fair to good   CLINICAL DECISION MAKING: Stable/uncomplicated   EVALUATION COMPLEXITY: moderate due to pt's PTSD and anxiety as well as multiple joint involvement     GOALS: Goals reviewed with patient? Yes   SHORT TERM GOALS: Target date: 05/21/2022  (Remove Blue Hyperlink)   Patient will demonstrate independent use of initial home exercise program to maintain progress from in clinic treatments. Goal status: MET 05/21/2022   Long term PT goals (target dates for all long term goals are 6 weeks  06/14/2022 ) Patient will demonstrate/report pain at worst less than or equal to 2/10 to facilitate minimal limitation in daily activity secondary to pain symptoms. Goal status: ongoing 06/10/22   Patient will demonstrate independent use of home exercise program to facilitate ability to maintain/progress functional  gains from skilled physical therapy services. Goal status: On-going 06/17/22   Patient will demonstrate FOTO outcome > or = 65 % to indicate reduced disability due to condition. Goal status: On-going 06/10/22   Pt will improve her Left active shoulder flexion to >/= 120 degrees. Goal status: on-going 06/10/22        5.  Pt  will improve her left UE strength to >/= 3/5 for improved functional mobility and ADL's.   Goal status: MET 06/10/22       PLAN: PT FREQUENCY: 1x/week (re-cert sent on 0/09/33)   PT DURATION: 6 weeks   PLANNED INTERVENTIONS: Therapeutic exercises, Therapeutic activity, Neuromuscular re-education, Balance training, Gait training, Patient/Family education, Joint mobilization, Dry Needling, Cryotherapy, Moist  heat, Taping, Vasopneumatic device, and Manual therapy   PLAN FOR NEXT SESSION: continue shoulder ROM, gentle strengthening as tolerated, modalities as needed.       Oretha Caprice, PT, MPT 07/01/2022, 1:51 PM

## 2022-07-03 DIAGNOSIS — F4312 Post-traumatic stress disorder, chronic: Secondary | ICD-10-CM | POA: Diagnosis not present

## 2022-07-03 DIAGNOSIS — F411 Generalized anxiety disorder: Secondary | ICD-10-CM | POA: Diagnosis not present

## 2022-07-03 DIAGNOSIS — F332 Major depressive disorder, recurrent severe without psychotic features: Secondary | ICD-10-CM | POA: Diagnosis not present

## 2022-07-10 ENCOUNTER — Other Ambulatory Visit: Payer: Self-pay | Admitting: Internal Medicine

## 2022-07-10 DIAGNOSIS — F4312 Post-traumatic stress disorder, chronic: Secondary | ICD-10-CM | POA: Diagnosis not present

## 2022-07-10 DIAGNOSIS — F411 Generalized anxiety disorder: Secondary | ICD-10-CM | POA: Diagnosis not present

## 2022-07-10 DIAGNOSIS — E1165 Type 2 diabetes mellitus with hyperglycemia: Secondary | ICD-10-CM

## 2022-07-10 DIAGNOSIS — F332 Major depressive disorder, recurrent severe without psychotic features: Secondary | ICD-10-CM | POA: Diagnosis not present

## 2022-07-12 DIAGNOSIS — F332 Major depressive disorder, recurrent severe without psychotic features: Secondary | ICD-10-CM | POA: Diagnosis not present

## 2022-07-12 DIAGNOSIS — R251 Tremor, unspecified: Secondary | ICD-10-CM | POA: Diagnosis not present

## 2022-07-12 DIAGNOSIS — R2689 Other abnormalities of gait and mobility: Secondary | ICD-10-CM | POA: Diagnosis not present

## 2022-07-12 DIAGNOSIS — Z8673 Personal history of transient ischemic attack (TIA), and cerebral infarction without residual deficits: Secondary | ICD-10-CM | POA: Diagnosis not present

## 2022-07-15 ENCOUNTER — Ambulatory Visit (INDEPENDENT_AMBULATORY_CARE_PROVIDER_SITE_OTHER): Payer: Medicare PPO | Admitting: Physical Therapy

## 2022-07-15 ENCOUNTER — Encounter: Payer: Self-pay | Admitting: Physical Therapy

## 2022-07-15 DIAGNOSIS — M25522 Pain in left elbow: Secondary | ICD-10-CM | POA: Diagnosis not present

## 2022-07-15 DIAGNOSIS — R600 Localized edema: Secondary | ICD-10-CM

## 2022-07-15 DIAGNOSIS — M6281 Muscle weakness (generalized): Secondary | ICD-10-CM | POA: Diagnosis not present

## 2022-07-15 DIAGNOSIS — M25512 Pain in left shoulder: Secondary | ICD-10-CM

## 2022-07-15 DIAGNOSIS — M25622 Stiffness of left elbow, not elsewhere classified: Secondary | ICD-10-CM

## 2022-07-15 DIAGNOSIS — M25612 Stiffness of left shoulder, not elsewhere classified: Secondary | ICD-10-CM | POA: Diagnosis not present

## 2022-07-15 NOTE — Therapy (Addendum)
OUTPATIENT PHYSICAL THERAPY TREATMENT NOTE     Patient Name: Meagan Moran MRN: [id number] DOB:07/22/1956, 66 y.o., female Today's Date: 07/15/2022  PCP: Carol Ada MD REFERRING PROVIDER: Mcarthur Rossetti, MD  END OF SESSION:   PT End of Session - 07/15/22 1329     Visit Number 10    Number of Visits 12    Date for PT Re-Evaluation 07/26/22    PT Start Time 1304    PT Stop Time 1344    PT Time Calculation (min) 40 min    Activity Tolerance Patient tolerated treatment well    Behavior During Therapy WFL for tasks assessed/performed                  Past Medical History:  Diagnosis Date   Back pain    Diabetes mellitus, type II ([place])    Heart murmur    Sleep apnea    Past Surgical History:  Procedure Laterality Date   CESAREAN SECTION     Patient Active Problem List   Diagnosis Date Noted   Type 2 diabetes mellitus with hyperglycemia, with long-term current use of insulin ([place]) 08/05/2019    REFERRING DIAG: W09.811B (ICD-10-CM) - Humeral head fracture, left, closed, initial encounter S42.402A (ICD-10-CM) - Elbow fracture, left, closed, initial encounter  THERAPY DIAG:  Acute pain of left shoulder  Pain in left elbow  Muscle weakness (generalized)  Stiffness of left shoulder, not elsewhere classified  Stiffness of left elbow, not elsewhere classified  Localized edema  Rationale for Evaluation and Treatment Rehabilitation  PERTINENT HISTORY: back pain, DM, heart mumur, sleep apnea, PTSD, anxiety   PRECAUTIONS: none  SUBJECTIVE: Pt arriving today reporting 5/10 pain in both shoulder and elbow on her left.   PAIN:  Are you having pain? Yes: NPRS scale: 5/10 Pain location: left shoulder and elbow Pain description: soreness, tightness,  Aggravating factors: reaching, lifting Relieving factors: resting   OBJECTIVE: (objective measures completed at initial evaluation unless otherwise dated) DIAGNOSTIC FINDINGS:  3 views of the  left shoulder show well located shoulder.  There is a  healing proximal humerus fracture there is a 2 part fracture with the  humeral shaft impacted into the head.  The head is well located within the  glenohumeral joint.  There has been significant interval healing from the  past x-rays comparison.   PATIENT SURVEYS:  04/30/22: FOTO 59% (predicted 65%) 06/10/22: FOTO 50%   COGNITION:           04/30/2022: Overall cognitive status: Within functional limits for tasks assessed                                  SENSATION: 04/30/2022: WFL   POSTURE: 04/30/2022: Rounded shoulders forward head   UPPER EXTREMITY ROM:    Active ROM Right Eval 04/30/2022:  sitting Left Eval 04/30/2022 sitting Left 05/14/22 supine Left 04/2722 supine Left 06/03/22 Supine passive Left 06/10/22 supine active Left 06/24/22 Supine active  Shoulder flexion 155 70 120 124 130 125 125  Shoulder extension 45 40 40 42     Shoulder abduction 140 34 95 100 110 104 105  Shoulder adduction           Shoulder internal rotation 65 60       Shoulder external rotation 62 30   40 38 40   Elbow flexion 140 90 130 130  126 125  Elbow extension 0 -35 -  35 -38  -36 -35  Wrist flexion           Wrist extension           Wrist ulnar deviation           Wrist radial deviation           Wrist pronation           Wrist supination           (Blank rows = not tested)   UPPER EXTREMITY MMT:   MMT Right Eval 04/30/2022 Left Eval 04/30/2022 Left 06/10/22 Left 07/01/22  Shoulder flexion 5/5 2/5 3+/5 3+/5  Shoulder extension 5/5 2/5 3/5 3+/5  Shoulder abduction 5/5 2/5 3/5 3+/5  Shoulder adduction        Shoulder internal rotation 5/5 2/5 3/5 3+/5  Shoulder external rotation 5/5 2/5 3/5 3+/5  Middle trapezius        Lower trapezius        Elbow flexion 5/5 2/5 3+/5 3+/5  Elbow extension 5/5 2/5 3+/5 3+/5  Wrist flexion        Wrist extension        Wrist ulnar deviation        Wrist radial deviation        Wrist pronation         Wrist supination        Grip strength (lbs) 14.1 41.2  32.0 ppsi  (Blank rows = not tested)         PALPATION:  TTP: left anterior and lateral shoulder, lateral left elbow 06/24/22: TTP: posterior shoulder joint, Rt levator scapulae             TODAY'S TREATMENT:  07/15/2022:  TherEx: UBE: L2.5 x  3 minutes each direction  BATCA: rows: 15# 3 x 15 BATCA: lat pull downs 10 # 3 x 15 Bicep curls 4# 2 x 10 bilateral UE's  Lifting 2# weight x 10 on 1st shelf and then 10 x  on 2nd shelf. Attempting 3# but pt unable to perform.  Standing AAROM flexion  2 # bar x 15 holding 3 seconds (cues to prevent left shoulder hiking) Standing AAROM abduction 2 # bar x 15 holding 3 seconds Rolling 2 # ball up wall x 15 using bilateral uE's.      8/7//2023:  TherEx: UBE: L2.5 x  3 minutes each direction  BATCA: rows: 15# 3 x 10 BATCA: lat pull downs 10 # 3 x 10 BATCA: bicep curls 5# 2 x 10 bilateral UE's  Lifting small cone on 1st gym shelf x 10, cone on 2nd shelf x 10, 1 # weight x 10 on 1st and 2nd shelf and then 2# weight x 10 on 2nd shelf.  Standing AAROM flexion  2 # bar x 15 holding 3 seconds (cues to prevent left shoulder hiking) Standing AAROM abduction 2 # bar x 15 holding 3 seconds Wall ladder: flexion x 5 (up to #21) Wall ladder scaption x 5 (up to #16)    06/24/2022:  TherEx: UBE: L2.5 x  4 minutes each direction  BATCA: rows: 15# 3 x 10 BATCA: lat pull downs 10 # 3 x 10 BATCA: bicep curls 5# 2 x 10 bilateral UE's  Wall push ups x 15  Standing AAROM flexion  2 # bar x 15 holding 3 seconds (cues to prevent left shoulder hiking) Standing AAROM abduction 2 # bar x 15 holding 3 seconds  Manual PROM Left shoulder and Elbow, Grade  2-3 [place] AP and inferior mobs c moist heat on posterior shoulder.       PATIENT EDUCATION: Education details: PT POC, discussed re-certification with pt and husband, Person educated: Patient Education method: Explanation, Demonstration, Tactile  cues, Verbal cues, and Handouts Education comprehension: verbalized understanding, returned demonstration, and verbal cues required     HOME EXERCISE PROGRAM:   Access Code: [email or web address] URL: [email or web address] Date: 05/27/2022 Prepared by: Kearney Hard  Exercises - Seated Shoulder Flexion Towel Slide at Table Top  - 3 x daily - 7 x weekly - 10 reps - 3-5 supi hold - Seated Shoulder Scaption Slide at Table Top with Forearm in Neutral  - 3 x daily - 7 x weekly - 10 reps - 3- 5 seconds hold - Seated Shoulder External Rotation PROM on Table  - 3 x daily - 7 x weekly - 10 reps - 3-5 seconds hold - Seated Scapular Retraction  - 3 x daily - 7 x weekly - 10 reps - 3-5 seconds hold - Doorway Pec Stretch at 60 Elevation  - 1-2 x daily - 7 x weekly - 3 reps - 30 seconds hold - Corner Pec Minor Stretch  - 1-2 x daily - 7 x weekly - 3 reps - 30 seocncs hold - Supine Shoulder Flexion Extension AAROM with Dowel  - 1-2 x daily - 7 x weekly - 10 reps - 5 seconds hold - Supine Shoulder External Rotation in 45 Degrees Abduction AAROM with Dowel  - 1-2 x daily - 7 x weekly - 10 reps - 5 seconds hold - Shoulder External Rotation and Scapular Retraction with Resistance  - 1-2 x daily - 7 x weekly - 2 sets - 10 reps - 3 seconds hold - Standing Row with Anchored Resistance  - 1-2 x daily - 7 x weekly - 2 sets - 10 reps - 3 seconds hold ASSESSMENT:   CLINICAL IMPRESSION: Pt arriving after missing last week due to therapist vacation with more stiffness noted in her Rt shoulder and Rt elbow. Pt able to tolerate exercises with 7/10 pain reported. No reports on increased pain during session. Pt was issued yellow therapy putty for forearm strengthening and mobility as well as Rt shoulder wt bearing at counter top. Continue skilled PT toward goals set.      OBJECTIVE IMPAIRMENTS Abnormal gait, decreased activity tolerance, decreased mobility, decreased ROM, decreased strength, increased edema, impaired  flexibility, impaired UE functional use, and pain.    ACTIVITY LIMITATIONS carrying, lifting, bed mobility, bathing, toileting, dressing, and reach over head   PARTICIPATION LIMITATIONS: cleaning, laundry, shopping, and community activity   PERSONAL FACTORS back pain, DM, heart mumur, sleep apnea,  are also affecting patient's functional outcome.    REHAB POTENTIAL: Fair to good   CLINICAL DECISION MAKING: Stable/uncomplicated   EVALUATION COMPLEXITY: moderate due to pt's PTSD and anxiety as well as multiple joint involvement     GOALS: Goals reviewed with patient? Yes   SHORT TERM GOALS: Target date: 05/21/2022  (Remove Blue Hyperlink)   Patient will demonstrate independent use of initial home exercise program to maintain progress from in clinic treatments. Goal status: MET 05/21/2022   Long term PT goals (target dates for all long term goals are 6 weeks  06/14/2022 ) Patient will demonstrate/report pain at worst less than or equal to 2/10 to facilitate minimal limitation in daily activity secondary to pain symptoms. Goal status: ongoing 06/10/22   Patient will demonstrate independent use of home exercise program to facilitate  ability to maintain/progress functional gains from skilled physical therapy services. Goal status: On-going 06/17/22   Patient will demonstrate FOTO outcome > or = 65 % to indicate reduced disability due to condition. Goal status: On-going 06/10/22   Pt will improve her Left active shoulder flexion to >/= 120 degrees. Goal status: on-going 06/10/22        5.  Pt  will improve her left UE strength to >/= 3/5 for improved functional mobility and ADL's.   Goal status: MET 06/10/22       PLAN: PT FREQUENCY: 1x/week (re-cert sent on 7/61/4709)   PT DURATION: 6 weeks   PLANNED INTERVENTIONS: Therapeutic exercises, Therapeutic activity, Neuromuscular re-education, Balance training, Gait training, Patient/Family education, Joint mobilization, Dry Needling,  Cryotherapy, Moist heat, Taping, Vasopneumatic device, and Manual therapy   PLAN FOR NEXT SESSION: continue shoulder ROM, gentle strengthening as tolerated, modalities as needed.       Oretha Caprice, PT, MPT 07/15/2022, 1:33 PM

## 2022-07-17 DIAGNOSIS — F332 Major depressive disorder, recurrent severe without psychotic features: Secondary | ICD-10-CM | POA: Diagnosis not present

## 2022-07-17 DIAGNOSIS — F4312 Post-traumatic stress disorder, chronic: Secondary | ICD-10-CM | POA: Diagnosis not present

## 2022-07-17 DIAGNOSIS — F411 Generalized anxiety disorder: Secondary | ICD-10-CM | POA: Diagnosis not present

## 2022-07-19 ENCOUNTER — Other Ambulatory Visit: Payer: Self-pay | Admitting: Internal Medicine

## 2022-07-19 ENCOUNTER — Ambulatory Visit: Payer: Medicare PPO | Admitting: Internal Medicine

## 2022-07-19 ENCOUNTER — Encounter: Payer: Self-pay | Admitting: Internal Medicine

## 2022-07-19 VITALS — BP 118/68 | HR 68 | Ht 72.0 in | Wt 142.2 lb

## 2022-07-19 DIAGNOSIS — Z794 Long term (current) use of insulin: Secondary | ICD-10-CM | POA: Diagnosis not present

## 2022-07-19 DIAGNOSIS — F419 Anxiety disorder, unspecified: Secondary | ICD-10-CM

## 2022-07-19 DIAGNOSIS — E1165 Type 2 diabetes mellitus with hyperglycemia: Secondary | ICD-10-CM | POA: Diagnosis not present

## 2022-07-19 DIAGNOSIS — R634 Abnormal weight loss: Secondary | ICD-10-CM | POA: Diagnosis not present

## 2022-07-19 DIAGNOSIS — E782 Mixed hyperlipidemia: Secondary | ICD-10-CM

## 2022-07-19 LAB — POCT GLYCOSYLATED HEMOGLOBIN (HGB A1C): Hemoglobin A1C: 6.4 % — AB (ref 4.0–5.6)

## 2022-07-19 MED ORDER — FREESTYLE LIBRE 3 SENSOR MISC: 1.0000 | 3 refills | Status: AC

## 2022-07-19 NOTE — Progress Notes (Signed)
Patient ID: Meagan Moran, female   DOB: 04-Apr-1956, 66 y.o.   MRN: [id number]   HPI: Meagan Moran is a 66 y.o.-year-old female, initially referred by her PCP, Dr. Merri Brunette, returning for follow-up for DM2, dx in 2005, prev. insulin-dependent, uncontrolled, without long-term complications. Last visit 3.5 months ago.  She is here with her husband who offers most of the history as patient is mostly nonverbal.  Interim history: Her edema improved after getting better fitting dentures last year.  She previously lost a significant amount of weight. No blurry vision, nausea, chest pain, swelling.  She does have urinary incontinence, per husband. She sees neurology >> was recommended to have her cathecholamines checked. She has severe PTSD >> on Ketamine infusion, Xanax, Geodon. Since last visit, she decreased the intake of milk, from 1.5 gallons to 0.5 gallons a week.  Reviewed history: In 2020, husband was telling me that they had to take her insulin away and lock it as she was using too much insulin, even when sugars were only slightly above normal. Her sugars did improve after this measure.  Reviewed HbA1c levels: Lab Results  Component Value Date   HGBA1C 6.7 (A) 04/05/2022   HGBA1C 6.6 (A) 08/22/2021   HGBA1C 6.4 (A) 02/19/2021   HGBA1C 6.4 (A) 08/22/2020   HGBA1C 7.1 (A) 04/20/2020   HGBA1C 6.2 (A) 09/23/2019  07/01/2019: HbA1c 6.9% 09/16/2018: HbA1c 7.1%  She is on: - Metformin 500 mg 2x a day, with meals >> 500 mg with breakfast >> 500 mg 2x a day with meals (lunch and dinner) She is using NovoLog as needed 6 units for very high blood sugars now.  She was previously on: - Tresiba 90 +/-20 units at bedtime >> stopped 05/2019. - Novolog ~10 units 3x a day, before meals  - Jardiance >> yeast inf's.  She checks her sugars more than 4 times a day with her freestyle libre CGM:     Previously:   Lowest sugar was 30 >> ... 75 >> 100 >> 70s; she has hypoglycemia awareness  at 100. Highest sugar was 298 >> ... 221 >> 200 (pancakes)  Glucometer: Freestyle Libre 2, One Touch  Pt's meals are:  - Breakfast: oatmeal >> pancakes - Lunch: cold cut meats + cheese puffs, 3 musketeers bar - Dinner: Different - Snacks: chocolate, juice; also snacks at night Tries to walk for exercise daily.  -No CKD, last BUN/creatinine:  08/29/2021: Glucose 126, BUN/creatinine 22/0.63, GFR 98 09/21/2020: Glucose 97, BUN/creatinine 19/0.59 Lab Results  Component Value Date   BUN 9 01/31/2020   CREATININE 0.65 01/31/2020  06/24/2019: 7/0.58, glucose 172 On losartan.  -+ HL L; last set of lipids: Lab Results  Component Value Date   CHOL 207 (H) 04/05/2022   HDL 62.70 04/05/2022   LDLCALC 120 (H) 04/05/2022   TRIG 124.0 04/05/2022   CHOLHDL 3 04/05/2022  09/21/2020: 175/153/51/97 06/24/2019: 207/130/43/138 09/16/2018: 267/404/38/190 Not on pravastatin 20 b/c upset stomach.  Previously on Vascepa - stopped as she had GI sxs from it.  At last visit, I suggested another statin, but she did not start this.  - last eye exam was on 05/09/2022, no DR.  -She denies numbness and tingling in her feet.  On her exam from 06/2019: Callused feet, onychomycosis, long nails; but with normal circulation and response to monofilament. Foot exam was done by PCP in 08/2021 reportedly.  She continues on Cymbalta.  She started Neurontin -for mental health.  Latest TSH is normal: 08/21/2021: TSH  1.73 09/21/2020: TSH 0.66 06/24/2019: TSH 1.06 No results found for: "TSH"  Pt has FH of DM in M, [place], M aunt, M cousins.  ROS: + See HPI  I reviewed pt's medications, allergies, PMH, social hx, family hx, and changes were documented in the history of present illness. Otherwise, unchanged from my initial visit note.  Past Medical History:  Diagnosis Date   Back pain    Diabetes mellitus, type II (HCC)    Heart murmur    Sleep apnea    Past Surgical History:  Procedure Laterality Date    CESAREAN SECTION     Social History   Socioeconomic History   Marital status: Married    Spouse name: Not on file   Number of children: 2   Years of education: Not on file   Highest education level: Not on file  Occupational History    Early retirement  Social Needs   Financial resource strain: Not on file   Food insecurity    Worry: Not on file    Inability: Not on file   Transportation needs    Medical: Not on file    Non-medical: Not on file  Tobacco Use   Smoking status: Current Every Day Smoker   Smokeless tobacco: Never Used  Substance and Sexual Activity   Alcohol use:  Once a year   Drug use: No   Current Outpatient Medications on File Prior to Visit  Medication Sig Dispense Refill   Acetylcysteine (NAC 600 PO) Take by mouth.     amLODipine (NORVASC) 5 MG tablet TAKE 1 TABLET EVERY DAY AT LUNCH     busPIRone (BUSPAR) 30 MG tablet TAKE 1 TABLET BY MOUTH TWICE A DAY 60 tablet 0   clonazePAM (KLONOPIN) 0.5 MG tablet take 1 tablet by oral route 2 times every day and 2 tablets at bedtime.     Continuous Blood Gluc Receiver (FREESTYLE LIBRE 2 READER SYSTM) DEVI 1 each by Does not apply route once for 1 dose. 1 each 0   Continuous Blood Gluc Sensor (FREESTYLE LIBRE 2 SENSOR) MISC USE AS INSTRUCTED APPLY EVERY 14 DAYS 6 each 3   Cyanocobalamin (VITAMIN B12 PO) Take by mouth.     glucagon (GLUCAGON EMERGENCY) 1 MG injection Inject 1 mg into the muscle once as needed for up to 1 dose. 1 each 12   GLUTATHIONE PO Take by mouth.     hydrochlorothiazide (MICROZIDE) 12.5 MG capsule Take 12.5 mg by mouth daily as needed.     losartan (COZAAR) 100 MG tablet Take 100 mg by mouth daily.     MELATONIN PO Take 20 mg by mouth. Taking 1 tab in the evening and 1 tab before bedtime.     metFORMIN (GLUCOPHAGE) 500 MG tablet TAKE 1 TABLET BY MOUTH TWICE A DAY WITH MEALS 180 tablet 0   Multiple Vitamins-Minerals (ZINC PO) Take by mouth.     Oxcarbazepine (TRILEPTAL) 300 MG tablet Take 1 tablet  (300 mg total) by mouth at bedtime. 90 tablet 0   pravastatin (PRAVACHOL) 20 MG tablet Take 20 mg by mouth daily.     Probiotic Product (PROBIOTIC PO) Take by mouth.     tiZANidine (ZANAFLEX) 4 MG tablet TAKE 1 TABLET 4 TIMES A DAY AS NEEDED FOR MUSCLE SPASMS     traZODone (DESYREL) 50 MG tablet Take by mouth.     VITAMIN D PO Take by mouth.     ziprasidone (GEODON) 20 MG capsule Take by mouth.  No current facility-administered medications on file prior to visit.   Allergies  Allergen Reactions   Nsaids    Sulfa Antibiotics    Family History  Problem Relation Age of Onset   Anxiety disorder Mother    Anxiety disorder Maternal Grandmother    Anxiety disorder Daughter   She also has a history of HL and lung cancer in father; heart disease in mother.  PE: BP 118/68 (BP Location: Left Arm, Patient Position: Sitting, Cuff Size: Normal)   Pulse 68   Ht 6' (1.829 m)   Wt 142 lb 3.2 oz (64.5 kg)   SpO2 98%   BMI 19.29 kg/m  Wt Readings from Last 3 Encounters:  07/19/22 142 lb 3.2 oz (64.5 kg)  03/05/22 150 lb (68 kg)  08/22/21 150 lb 9.6 oz (68.3 kg)   Constitutional: Normal weight, in NAD Eyes: no exophthalmos ENT: moist mucous membranes, no masses palpated in neck, no cervical lymphadenopathy Cardiovascular: RRR, No MRG Respiratory: CTA B Musculoskeletal: no deformities Skin: moist, warm, no rashes Neurological: no tremor with outstretched hands  ASSESSMENT: 1. DM2, insulin-dependent, uncontrolled, without long-term complications, but with hypo and hyperglycemia In 2020, I received a message from Robley Fries, PhD, her counselor, letting me know about the patient's anxiety/depression and the fact that she may overcorrect low blood sugars or eat a lot of sweets to try to prevent blood sugar crashes.  2. HL  3.  Significant weight loss  4.  Anxiety  PLAN:  1. Patient with longstanding type 2 diabetes, on low-dose metformin only.  In the past, she was on insulin,  but she was not eating much and lost a significant amount of weight and insulin was stopped due to overuse.  He started to eat better after she got her new denture but at last visit, she was drinking a lot of milk, approximately 1.5 gallons a week (replaced smoking with drinking milk) and I strongly advised her to reduce and stop this if possible.  I suggested a plant-based milk instead.   -At last visit, she had a CGM, but she only had a few data points as she was using this intermittently.  I advised her to use it consistently.  Her HbA1c was still at goal, although slightly higher than before, 6.7%.  We did not change her regimen at that time. CGM interpretation: -At today's visit, we reviewed her CGM downloads: It appears that 95% of values are in target range (goal >70%), while 5% are higher than 180 (goal <25%), and 0% are lower than 70 (goal <4%).  The calculated average blood sugar is 135.   -Reviewing the CGM trends, we still have significant loss of data on her ambulatory glucose profile.  Reviewing individual daily traces, she occasionally gets higher blood sugars after breakfast, especially when she eats pancakes with syrup.  I advised her to try to reduce the portion but ideally to stop eating these.  Otherwise, I do not feel that we need to change her regimen. -Avoid further loss of data, I suggested to switch to a freestyle libre 3 CGM, which was recently approved for Medicare patients.  She gets these from a supplier for now, I did not send a prescription to her pharmacy but I advised the husband to talk to the supplier and send me a refill request for this sensor + receiver. - I suggested to:  Patient Instructions  Please continue: - Metformin 500 mg with lunch and dinner  Continue to try to stop  milk.  Please stop at the lab.  Please return in 6 months.    - we checked her HbA1c: 6.4% (lower) - advised to check sugars at different times of the day - 4x a day, rotating check  times - advised for yearly eye exams >> she is UTD - return to clinic in 6 months  2. HL - Reviewed latest lipid panel from last visit: LDL above target, increased: Lab Results  Component Value Date   CHOL 207 (H) 04/05/2022   HDL 62.70 04/05/2022   LDLCALC 120 (H) 04/05/2022   TRIG 124.0 04/05/2022   CHOLHDL 3 04/05/2022  -She was previously on pravastatin but this was stopped due to stomach discomfort.  Not on a statin now.  She tried Vascepa but she developed GI symptoms and she stopped it. -She was previously on a statin, pravastatin, but she developed stomach discomfort and stopped.  She tried Vascepa but developed GI symptoms and stopped this medication, also. -At last visit, discussed stopping milk >> she reduced the amount that she was drinking from 1.5 to 0.5 gallons.  3.  Significant weight loss -Also significant amount of weight previously, when she had problems with her dentures, but weight plateaued at ~150 pounds afterwards >> now lost 8 more lbs -We will check a TSH today to make sure she is not toxic -will continue Metformin, which is weight neutral  4.  Anxiety -Husband mentions that patient has severe PTSD and they are having a hard time treating this.  They saw neurology recently and he recommended to have her catecholamines checked. -At today's visit we discussed that cathecholamine/metanephrine release can be physiologic, in situations of stress, or pathologic, from a tumor (pheochromocytoma).  We can only delay the cause of the excess in case of her tumor. -I agreed to check both her catecholamines and metanephrines today.  I explained that her tumor is only suggested by levels increased more than 3 times above the upper limit of normal.  If there are mild fluctuations, we may need to repeat the test, but a tumor would be unlikely.  Component     Latest Ref Rng 07/19/2022  Metanephrine, Pl     <=57 pg/mL <25   Normetanephrine, Pl     <=148 pg/mL 141   Total  Metanephrines-Plasma     <=205 pg/mL 141   Epinephrine     pg/mL 37   Norepinephrine     pg/mL 585   Dopamine     pg/mL <10   Total Catecholamines     pg/mL 622     Component     Latest Ref Rng 07/19/2022  TSH     0.40 - 4.50 mIU/L 0.51    Plasma catecholamines and metanephrines are normal.  TSH is also normal.  Carlus Pavlov, MD PhD [facility]

## 2022-07-19 NOTE — Patient Instructions (Addendum)
Please continue: - Metformin 500 mg with lunch and dinner  Continue to try to stop milk.  Please stop at the lab.  Please return in 6 months.

## 2022-07-20 LAB — TSH: TSH: 0.51 mIU/L (ref 0.40–4.50)

## 2022-07-22 ENCOUNTER — Encounter: Payer: Self-pay | Admitting: Physical Therapy

## 2022-07-22 ENCOUNTER — Ambulatory Visit (INDEPENDENT_AMBULATORY_CARE_PROVIDER_SITE_OTHER): Payer: Medicare PPO | Admitting: Physical Therapy

## 2022-07-22 DIAGNOSIS — M25612 Stiffness of left shoulder, not elsewhere classified: Secondary | ICD-10-CM

## 2022-07-22 DIAGNOSIS — M25512 Pain in left shoulder: Secondary | ICD-10-CM

## 2022-07-22 DIAGNOSIS — M6281 Muscle weakness (generalized): Secondary | ICD-10-CM | POA: Diagnosis not present

## 2022-07-22 DIAGNOSIS — R600 Localized edema: Secondary | ICD-10-CM

## 2022-07-22 DIAGNOSIS — M25522 Pain in left elbow: Secondary | ICD-10-CM | POA: Diagnosis not present

## 2022-07-22 DIAGNOSIS — M25622 Stiffness of left elbow, not elsewhere classified: Secondary | ICD-10-CM

## 2022-07-22 NOTE — Therapy (Addendum)
OUTPATIENT PHYSICAL THERAPY TREATMENT NOTE Discharge     Patient Name: Meagan Moran MRN: [id number] DOB:[birth year], 66 y.o., female Today's Date: 07/22/2022  PCP: Carol Ada MD REFERRING PROVIDER: Mcarthur Rossetti, MD  END OF SESSION:   PT End of Session - 07/22/22 1536     Visit Number 11    Number of Visits 12    Date for PT Re-Evaluation 07/26/22    PT Start Time 1305    PT Stop Time 74    PT Time Calculation (min) 50 min    Activity Tolerance Patient tolerated treatment well    Behavior During Therapy WFL for tasks assessed/performed                   Past Medical History:  Diagnosis Date   Back pain    Diabetes mellitus, type II ([place])    Heart murmur    Sleep apnea    Past Surgical History:  Procedure Laterality Date   CESAREAN SECTION     Patient Active Problem List   Diagnosis Date Noted   Type 2 diabetes mellitus with hyperglycemia, with long-term current use of insulin ([place]) 08/05/2019    REFERRING DIAG: Z44.360X (ICD-10-CM) - Humeral head fracture, left, closed, initial encounter S42.402A (ICD-10-CM) - Elbow fracture, left, closed, initial encounter  THERAPY DIAG:  Acute pain of left shoulder  Pain in left elbow  Muscle weakness (generalized)  Stiffness of left shoulder, not elsewhere classified  Stiffness of left elbow, not elsewhere classified  Localized edema  Rationale for Evaluation and Treatment Rehabilitation  PERTINENT HISTORY: back pain, DM, heart mumur, sleep apnea, PTSD, anxiety   PRECAUTIONS: none  SUBJECTIVE: Pt arriving today reporting 5/10 pain in left shoulder and 6-7/10 in left elbow.  PAIN:  Are you having pain? Yes: NPRS scale: 5/10 in left shoulder and 6-7/10 in left elbow/10 Pain location: left shoulder and elbow Pain description: soreness, tightness,  Aggravating factors: reaching, lifting Relieving factors: resting   OBJECTIVE: (objective measures completed at initial evaluation  unless otherwise dated) DIAGNOSTIC FINDINGS:  3 views of the left shoulder show well located shoulder.  There is a  healing proximal humerus fracture there is a 2 part fracture with the  humeral shaft impacted into the head.  The head is well located within the  glenohumeral joint.  There has been significant interval healing from the  past x-rays comparison.   PATIENT SURVEYS:  04/30/22: FOTO 59% (predicted 65%) 06/10/22: FOTO 50%   COGNITION:           04/30/2022: Overall cognitive status: Within functional limits for tasks assessed                                  SENSATION: 04/30/2022: WFL   POSTURE: 04/30/2022: Rounded shoulders forward head   UPPER EXTREMITY ROM:    Active ROM Right Eval 04/30/2022:  sitting Left Eval 04/30/2022 sitting Left 05/14/22 supine Left 04/2722 supine Left 06/03/22 Supine passive Left 06/10/22 supine active Left 06/24/22 Supine active Left 07/22/22 Active supine  Shoulder flexion 155 70 120 124 130 125 125 135  Shoulder extension 45 40 40 42      Shoulder abduction 140 34 95 100 110 104 105 112  Shoulder adduction            Shoulder internal rotation 65 60        Shoulder external rotation 62 30   40  38 40  42  Elbow flexion 140 90 130 130  126 125 130  Elbow extension 0 -35 -35 -38  -36 -35 -30  Wrist flexion            Wrist extension            Wrist ulnar deviation            Wrist radial deviation            Wrist pronation            Wrist supination            (Blank rows = not tested)   UPPER EXTREMITY MMT:   MMT Right Eval 04/30/2022 Left Eval 04/30/2022 Left 06/10/22 Left 07/01/22 Left 07/22/22  Shoulder flexion 5/5 2/5 3+/5 3+/5 4/5  Shoulder extension 5/5 2/5 3/5 3+/5 4/5  Shoulder abduction 5/5 2/5 3/5 3+/5 3+/5  Shoulder adduction         Shoulder internal rotation 5/5 2/5 3/5 3+/5 3+/5  Shoulder external rotation 5/5 2/5 3/5 3+/5 3+/5  Middle trapezius         Lower trapezius         Elbow flexion 5/5 2/5 3+/5 3+/5  4/5  Elbow extension 5/5 2/5 3+/5 3+/5 4/5  Wrist flexion         Wrist extension         Wrist ulnar deviation         Wrist radial deviation         Wrist pronation         Wrist supination         Grip strength (lbs) 14.1 41.2  32.0 ppsi   (Blank rows = not tested)         PALPATION:  TTP: left anterior and lateral shoulder, lateral left elbow 06/24/22: TTP: posterior shoulder joint, Rt levator scapulae             TODAY'S TREATMENT:  07/22/2022:  TherEx: UBE: L2.5 x  3 minutes each direction  BATCA: rows: 15# 3 x 15 BATCA: lat pull downs 10 # 3 x 10 BATCA: chest press: 5# 3 x 10 Bicep curls 4# 2 x 10 bilateral UE's  Standing AAROM flexion  2 # bar x 15 holding 3 seconds (cues to prevent left shoulder hiking) Standing AAROM abduction 2 # bar x 15 holding 3 seconds Rolling 2 # ball up wall x 20 using bilateral uE's.  Bicep curls using 3# bar with 3# ankle weight attached    07/15/2022:  TherEx: UBE: L2.5 x  3 minutes each direction  BATCA: rows: 15# 3 x 15 BATCA: lat pull downs 10 # 3 x 15 Bicep curls 4# 2 x 10 bilateral UE's  Lifting 2# weight x 10 on 1st shelf and then 10 x  on 2nd shelf. Attempting 3# but pt unable to perform.  Standing AAROM flexion  2 # bar x 15 holding 3 seconds (cues to prevent left shoulder hiking) Standing AAROM abduction 2 # bar x 15 holding 3 seconds Rolling 2 # ball up wall x 15 using bilateral uE's.      8/7//2023:  TherEx: UBE: L2.5 x  3 minutes each direction  BATCA: rows: 15# 3 x 10 BATCA: lat pull downs 10 # 3 x 10 BATCA: bicep curls 5# 2 x 10 bilateral UE's  Lifting small cone on 1st gym shelf x 10, cone on 2nd shelf x 10, 1 # weight x 10  on 1st and 2nd shelf and then 2# weight x 10 on 2nd shelf.  Standing AAROM flexion  2 # bar x 15 holding 3 seconds (cues to prevent left shoulder hiking) Standing AAROM abduction 2 # bar x 15 holding 3 seconds Wall ladder: flexion x 5 (up to #21) Wall ladder scaption x 5 (up to  #16)        PATIENT EDUCATION: Education details: PT POC, discussed re-certification with pt and husband, Person educated: Patient Education method: Explanation, Demonstration, Tactile cues, Verbal cues, and Handouts Education comprehension: verbalized understanding, returned demonstration, and verbal cues required     HOME EXERCISE PROGRAM:   Access Code: [email or web address] URL: [email or web address] Date: 05/27/2022 Prepared by: Kearney Hard  Exercises - Seated Shoulder Flexion Towel Slide at Table Top  - 3 x daily - 7 x weekly - 10 reps - 3-5 supi hold - Seated Shoulder Scaption Slide at Table Top with Forearm in Neutral  - 3 x daily - 7 x weekly - 10 reps - 3- 5 seconds hold - Seated Shoulder External Rotation PROM on Table  - 3 x daily - 7 x weekly - 10 reps - 3-5 seconds hold - Seated Scapular Retraction  - 3 x daily - 7 x weekly - 10 reps - 3-5 seconds hold - Doorway Pec Stretch at 60 Elevation  - 1-2 x daily - 7 x weekly - 3 reps - 30 seconds hold - Corner Pec Minor Stretch  - 1-2 x daily - 7 x weekly - 3 reps - 30 seocncs hold - Supine Shoulder Flexion Extension AAROM with Dowel  - 1-2 x daily - 7 x weekly - 10 reps - 5 seconds hold - Supine Shoulder External Rotation in 45 Degrees Abduction AAROM with Dowel  - 1-2 x daily - 7 x weekly - 10 reps - 5 seconds hold - Shoulder External Rotation and Scapular Retraction with Resistance  - 1-2 x daily - 7 x weekly - 2 sets - 10 reps - 3 seconds hold - Standing Row with Anchored Resistance  - 1-2 x daily - 7 x weekly - 2 sets - 10 reps - 3 seconds hold ASSESSMENT:   CLINICAL IMPRESSION: Pt has met all 4/5 of her LTG's set at her initial evaluation. Pt is still reporting pain in her left shoulder and left elbow with activities. Pt has improved her strength and overall ROM since beginning therapy. Pt's HEP has been updated with pt and husband able to demonstrate independence. I am discharging pt from [facility].        OBJECTIVE IMPAIRMENTS Abnormal gait, decreased activity tolerance, decreased mobility, decreased ROM, decreased strength, increased edema, impaired flexibility, impaired UE functional use, and pain.    ACTIVITY LIMITATIONS carrying, lifting, bed mobility, bathing, toileting, dressing, and reach over head   PARTICIPATION LIMITATIONS: cleaning, laundry, shopping, and community activity   PERSONAL FACTORS back pain, DM, heart mumur, sleep apnea,  are also affecting patient's functional outcome.    REHAB POTENTIAL: Fair to good   CLINICAL DECISION MAKING: Stable/uncomplicated   EVALUATION COMPLEXITY: moderate due to pt's PTSD and anxiety as well as multiple joint involvement     GOALS: Goals reviewed with patient? Yes   SHORT TERM GOALS: Target date: 05/21/2022  (Remove Blue Hyperlink)   Patient will demonstrate independent use of initial home exercise program to maintain progress from in clinic treatments. Goal status: MET 05/21/2022   Long term PT goals (target dates for all long term goals are  6 weeks  06/14/2022 ) Patient will demonstrate/report pain at worst less than or equal to 2/10 to facilitate minimal limitation in daily activity secondary to pain symptoms. Goal status: Not MET 07/22/22 pt still reporting pain of 5-6/10 at times in her left shoulder and elbow   Patient will demonstrate independent use of home exercise program to facilitate ability to maintain/progress MET 07/22/22   Patient will demonstrate FOTO outcome > or = 65 % to indicate reduced disability due to condition. Goal status: MET 07/22/22   Pt will improve her Left active shoulder flexion to >/= 120 degrees. Goal status: MET 07/22/22        5.  Pt  will improve her left UE strength to >/= 3/5 for improved functional mobility and ADL's.   Goal status: MET 06/10/22       PLAN: PT FREQUENCY: 1x/week (re-cert sent on 8/32/9191)   PT DURATION: 6 weeks   PLANNED INTERVENTIONS: Therapeutic exercises,  Therapeutic activity, Neuromuscular re-education, Balance training, Gait training, Patient/Family education, Joint mobilization, Dry Needling, Cryotherapy, Moist heat, Taping, Vasopneumatic device, and Manual therapy   PLAN FOR NEXT SESSION: Pt has been discharged      Oretha Caprice, PT, MPT 07/22/2022, 3:37 PM PHYSICAL THERAPY DISCHARGE SUMMARY  Visits from Start of Care: 11  Current functional level related to goals / functional outcomes: See above   Remaining deficits: See above   Education / Equipment: Updated HEP   Patient agrees to discharge. Patient goals were partially met. Patient is being discharged due to being pleased with the current functional level.

## 2022-07-24 DIAGNOSIS — F4312 Post-traumatic stress disorder, chronic: Secondary | ICD-10-CM | POA: Diagnosis not present

## 2022-07-24 DIAGNOSIS — F332 Major depressive disorder, recurrent severe without psychotic features: Secondary | ICD-10-CM | POA: Diagnosis not present

## 2022-07-24 DIAGNOSIS — F411 Generalized anxiety disorder: Secondary | ICD-10-CM | POA: Diagnosis not present

## 2022-07-31 DIAGNOSIS — F332 Major depressive disorder, recurrent severe without psychotic features: Secondary | ICD-10-CM | POA: Diagnosis not present

## 2022-07-31 DIAGNOSIS — F411 Generalized anxiety disorder: Secondary | ICD-10-CM | POA: Diagnosis not present

## 2022-07-31 DIAGNOSIS — F4312 Post-traumatic stress disorder, chronic: Secondary | ICD-10-CM | POA: Diagnosis not present

## 2022-08-01 DIAGNOSIS — E785 Hyperlipidemia, unspecified: Secondary | ICD-10-CM | POA: Diagnosis not present

## 2022-08-01 DIAGNOSIS — E1169 Type 2 diabetes mellitus with other specified complication: Secondary | ICD-10-CM | POA: Diagnosis not present

## 2022-08-01 DIAGNOSIS — R634 Abnormal weight loss: Secondary | ICD-10-CM | POA: Diagnosis not present

## 2022-08-01 DIAGNOSIS — F331 Major depressive disorder, recurrent, moderate: Secondary | ICD-10-CM | POA: Diagnosis not present

## 2022-08-01 DIAGNOSIS — F17200 Nicotine dependence, unspecified, uncomplicated: Secondary | ICD-10-CM | POA: Diagnosis not present

## 2022-08-01 DIAGNOSIS — M2550 Pain in unspecified joint: Secondary | ICD-10-CM | POA: Diagnosis not present

## 2022-08-01 DIAGNOSIS — I10 Essential (primary) hypertension: Secondary | ICD-10-CM | POA: Diagnosis not present

## 2022-08-02 DIAGNOSIS — R2689 Other abnormalities of gait and mobility: Secondary | ICD-10-CM | POA: Diagnosis not present

## 2022-08-02 DIAGNOSIS — R251 Tremor, unspecified: Secondary | ICD-10-CM | POA: Diagnosis not present

## 2022-08-04 LAB — CATECHOLAMINES, FRACTIONATED, PLASMA
Dopamine: <10 pg/mL
Epinephrine: 37 pg/mL
Norepinephrine: 585 pg/mL
Total Catecholamines: 622 pg/mL

## 2022-08-04 LAB — METANEPHRINES, PLASMA
Metanephrine, Free: <25 pg/mL (ref <=57)
Normetanephrine, Free: 141 pg/mL (ref <=148)
Total Metanephrines-Plasma: 141 pg/mL (ref <=205)

## 2022-08-06 DIAGNOSIS — G20 Parkinson's disease: Secondary | ICD-10-CM | POA: Diagnosis not present

## 2022-08-06 DIAGNOSIS — Z8673 Personal history of transient ischemic attack (TIA), and cerebral infarction without residual deficits: Secondary | ICD-10-CM | POA: Diagnosis not present

## 2022-08-07 DIAGNOSIS — F332 Major depressive disorder, recurrent severe without psychotic features: Secondary | ICD-10-CM | POA: Diagnosis not present

## 2022-08-07 DIAGNOSIS — F4312 Post-traumatic stress disorder, chronic: Secondary | ICD-10-CM | POA: Diagnosis not present

## 2022-08-07 DIAGNOSIS — F411 Generalized anxiety disorder: Secondary | ICD-10-CM | POA: Diagnosis not present

## 2022-08-14 DIAGNOSIS — F411 Generalized anxiety disorder: Secondary | ICD-10-CM | POA: Diagnosis not present

## 2022-08-14 DIAGNOSIS — F332 Major depressive disorder, recurrent severe without psychotic features: Secondary | ICD-10-CM | POA: Diagnosis not present

## 2022-08-14 DIAGNOSIS — F4312 Post-traumatic stress disorder, chronic: Secondary | ICD-10-CM | POA: Diagnosis not present

## 2022-08-16 DIAGNOSIS — F4481 Dissociative identity disorder: Secondary | ICD-10-CM | POA: Diagnosis not present

## 2022-08-16 DIAGNOSIS — F4310 Post-traumatic stress disorder, unspecified: Secondary | ICD-10-CM | POA: Diagnosis not present

## 2022-08-16 DIAGNOSIS — F32A Depression, unspecified: Secondary | ICD-10-CM | POA: Diagnosis not present

## 2022-08-16 DIAGNOSIS — F09 Unspecified mental disorder due to known physiological condition: Secondary | ICD-10-CM | POA: Diagnosis not present

## 2022-08-19 ENCOUNTER — Encounter: Payer: Self-pay | Admitting: Internal Medicine

## 2022-08-20 ENCOUNTER — Telehealth: Payer: Self-pay

## 2022-08-20 NOTE — Telephone Encounter (Signed)
Inbound Mychart message from patient requesting form be completed and faxed with clinical notes. DME supplies ordered via [employer] through online portal.

## 2022-08-21 DIAGNOSIS — F411 Generalized anxiety disorder: Secondary | ICD-10-CM | POA: Diagnosis not present

## 2022-08-21 DIAGNOSIS — F332 Major depressive disorder, recurrent severe without psychotic features: Secondary | ICD-10-CM | POA: Diagnosis not present

## 2022-08-21 DIAGNOSIS — F4312 Post-traumatic stress disorder, chronic: Secondary | ICD-10-CM | POA: Diagnosis not present

## 2022-08-28 DIAGNOSIS — F332 Major depressive disorder, recurrent severe without psychotic features: Secondary | ICD-10-CM | POA: Diagnosis not present

## 2022-08-28 DIAGNOSIS — F411 Generalized anxiety disorder: Secondary | ICD-10-CM | POA: Diagnosis not present

## 2022-08-28 DIAGNOSIS — F4312 Post-traumatic stress disorder, chronic: Secondary | ICD-10-CM | POA: Diagnosis not present

## 2022-09-04 DIAGNOSIS — F411 Generalized anxiety disorder: Secondary | ICD-10-CM | POA: Diagnosis not present

## 2022-09-04 DIAGNOSIS — F332 Major depressive disorder, recurrent severe without psychotic features: Secondary | ICD-10-CM | POA: Diagnosis not present

## 2022-09-04 DIAGNOSIS — F4312 Post-traumatic stress disorder, chronic: Secondary | ICD-10-CM | POA: Diagnosis not present

## 2022-09-11 DIAGNOSIS — E1165 Type 2 diabetes mellitus with hyperglycemia: Secondary | ICD-10-CM | POA: Diagnosis not present

## 2022-09-11 DIAGNOSIS — F411 Generalized anxiety disorder: Secondary | ICD-10-CM | POA: Diagnosis not present

## 2022-09-11 DIAGNOSIS — F4312 Post-traumatic stress disorder, chronic: Secondary | ICD-10-CM | POA: Diagnosis not present

## 2022-09-11 DIAGNOSIS — F332 Major depressive disorder, recurrent severe without psychotic features: Secondary | ICD-10-CM | POA: Diagnosis not present

## 2022-09-13 DIAGNOSIS — F4323 Adjustment disorder with mixed anxiety and depressed mood: Secondary | ICD-10-CM | POA: Diagnosis not present

## 2022-09-18 DIAGNOSIS — F411 Generalized anxiety disorder: Secondary | ICD-10-CM | POA: Diagnosis not present

## 2022-09-18 DIAGNOSIS — F4312 Post-traumatic stress disorder, chronic: Secondary | ICD-10-CM | POA: Diagnosis not present

## 2022-09-18 DIAGNOSIS — F332 Major depressive disorder, recurrent severe without psychotic features: Secondary | ICD-10-CM | POA: Diagnosis not present

## 2022-09-25 DIAGNOSIS — F332 Major depressive disorder, recurrent severe without psychotic features: Secondary | ICD-10-CM | POA: Diagnosis not present

## 2022-09-25 DIAGNOSIS — F411 Generalized anxiety disorder: Secondary | ICD-10-CM | POA: Diagnosis not present

## 2022-09-25 DIAGNOSIS — F4312 Post-traumatic stress disorder, chronic: Secondary | ICD-10-CM | POA: Diagnosis not present

## 2022-10-02 DIAGNOSIS — F4312 Post-traumatic stress disorder, chronic: Secondary | ICD-10-CM | POA: Diagnosis not present

## 2022-10-02 DIAGNOSIS — F411 Generalized anxiety disorder: Secondary | ICD-10-CM | POA: Diagnosis not present

## 2022-10-02 DIAGNOSIS — F332 Major depressive disorder, recurrent severe without psychotic features: Secondary | ICD-10-CM | POA: Diagnosis not present

## 2022-10-05 ENCOUNTER — Other Ambulatory Visit: Payer: Self-pay | Admitting: Internal Medicine

## 2022-10-05 DIAGNOSIS — E1165 Type 2 diabetes mellitus with hyperglycemia: Secondary | ICD-10-CM

## 2022-10-09 DIAGNOSIS — F332 Major depressive disorder, recurrent severe without psychotic features: Secondary | ICD-10-CM | POA: Diagnosis not present

## 2022-10-09 DIAGNOSIS — F411 Generalized anxiety disorder: Secondary | ICD-10-CM | POA: Diagnosis not present

## 2022-10-09 DIAGNOSIS — F4312 Post-traumatic stress disorder, chronic: Secondary | ICD-10-CM | POA: Diagnosis not present

## 2022-10-11 ENCOUNTER — Ambulatory Visit: Payer: Medicare PPO | Admitting: Internal Medicine

## 2022-10-14 DIAGNOSIS — F4323 Adjustment disorder with mixed anxiety and depressed mood: Secondary | ICD-10-CM | POA: Diagnosis not present

## 2022-10-23 DIAGNOSIS — F4312 Post-traumatic stress disorder, chronic: Secondary | ICD-10-CM | POA: Diagnosis not present

## 2022-10-23 DIAGNOSIS — F332 Major depressive disorder, recurrent severe without psychotic features: Secondary | ICD-10-CM | POA: Diagnosis not present

## 2022-10-23 DIAGNOSIS — F411 Generalized anxiety disorder: Secondary | ICD-10-CM | POA: Diagnosis not present

## 2022-10-30 DIAGNOSIS — F411 Generalized anxiety disorder: Secondary | ICD-10-CM | POA: Diagnosis not present

## 2022-10-30 DIAGNOSIS — F332 Major depressive disorder, recurrent severe without psychotic features: Secondary | ICD-10-CM | POA: Diagnosis not present

## 2022-10-30 DIAGNOSIS — F4312 Post-traumatic stress disorder, chronic: Secondary | ICD-10-CM | POA: Diagnosis not present

## 2022-10-31 DIAGNOSIS — F4311 Post-traumatic stress disorder, acute: Secondary | ICD-10-CM | POA: Diagnosis not present

## 2022-10-31 DIAGNOSIS — F69 Unspecified disorder of adult personality and behavior: Secondary | ICD-10-CM | POA: Diagnosis not present

## 2022-10-31 DIAGNOSIS — F411 Generalized anxiety disorder: Secondary | ICD-10-CM | POA: Diagnosis not present

## 2022-11-06 DIAGNOSIS — F4312 Post-traumatic stress disorder, chronic: Secondary | ICD-10-CM | POA: Diagnosis not present

## 2022-11-06 DIAGNOSIS — F332 Major depressive disorder, recurrent severe without psychotic features: Secondary | ICD-10-CM | POA: Diagnosis not present

## 2022-11-06 DIAGNOSIS — F411 Generalized anxiety disorder: Secondary | ICD-10-CM | POA: Diagnosis not present

## 2022-11-11 DIAGNOSIS — E785 Hyperlipidemia, unspecified: Secondary | ICD-10-CM | POA: Diagnosis not present

## 2022-11-11 DIAGNOSIS — M797 Fibromyalgia: Secondary | ICD-10-CM | POA: Diagnosis not present

## 2022-11-11 DIAGNOSIS — I10 Essential (primary) hypertension: Secondary | ICD-10-CM | POA: Diagnosis not present

## 2022-11-11 DIAGNOSIS — F419 Anxiety disorder, unspecified: Secondary | ICD-10-CM | POA: Diagnosis not present

## 2022-11-11 DIAGNOSIS — G4733 Obstructive sleep apnea (adult) (pediatric): Secondary | ICD-10-CM | POA: Diagnosis not present

## 2022-11-11 DIAGNOSIS — F17200 Nicotine dependence, unspecified, uncomplicated: Secondary | ICD-10-CM | POA: Diagnosis not present

## 2022-11-13 DIAGNOSIS — F411 Generalized anxiety disorder: Secondary | ICD-10-CM | POA: Diagnosis not present

## 2022-11-13 DIAGNOSIS — F4312 Post-traumatic stress disorder, chronic: Secondary | ICD-10-CM | POA: Diagnosis not present

## 2022-11-13 DIAGNOSIS — F332 Major depressive disorder, recurrent severe without psychotic features: Secondary | ICD-10-CM | POA: Diagnosis not present

## 2022-11-20 DIAGNOSIS — F332 Major depressive disorder, recurrent severe without psychotic features: Secondary | ICD-10-CM | POA: Diagnosis not present

## 2022-11-20 DIAGNOSIS — F4312 Post-traumatic stress disorder, chronic: Secondary | ICD-10-CM | POA: Diagnosis not present

## 2022-11-20 DIAGNOSIS — F411 Generalized anxiety disorder: Secondary | ICD-10-CM | POA: Diagnosis not present

## 2022-11-27 DIAGNOSIS — F332 Major depressive disorder, recurrent severe without psychotic features: Secondary | ICD-10-CM | POA: Diagnosis not present

## 2022-11-27 DIAGNOSIS — F4312 Post-traumatic stress disorder, chronic: Secondary | ICD-10-CM | POA: Diagnosis not present

## 2022-11-27 DIAGNOSIS — F411 Generalized anxiety disorder: Secondary | ICD-10-CM | POA: Diagnosis not present

## 2022-11-28 DIAGNOSIS — F69 Unspecified disorder of adult personality and behavior: Secondary | ICD-10-CM | POA: Diagnosis not present

## 2022-11-28 DIAGNOSIS — F4311 Post-traumatic stress disorder, acute: Secondary | ICD-10-CM | POA: Diagnosis not present

## 2022-11-28 DIAGNOSIS — F411 Generalized anxiety disorder: Secondary | ICD-10-CM | POA: Diagnosis not present

## 2022-12-04 DIAGNOSIS — F332 Major depressive disorder, recurrent severe without psychotic features: Secondary | ICD-10-CM | POA: Diagnosis not present

## 2022-12-04 DIAGNOSIS — F4312 Post-traumatic stress disorder, chronic: Secondary | ICD-10-CM | POA: Diagnosis not present

## 2022-12-04 DIAGNOSIS — F411 Generalized anxiety disorder: Secondary | ICD-10-CM | POA: Diagnosis not present

## 2022-12-10 DIAGNOSIS — E1165 Type 2 diabetes mellitus with hyperglycemia: Secondary | ICD-10-CM | POA: Diagnosis not present

## 2022-12-11 DIAGNOSIS — F332 Major depressive disorder, recurrent severe without psychotic features: Secondary | ICD-10-CM | POA: Diagnosis not present

## 2022-12-11 DIAGNOSIS — F4312 Post-traumatic stress disorder, chronic: Secondary | ICD-10-CM | POA: Diagnosis not present

## 2022-12-11 DIAGNOSIS — F411 Generalized anxiety disorder: Secondary | ICD-10-CM | POA: Diagnosis not present

## 2022-12-18 DIAGNOSIS — F4312 Post-traumatic stress disorder, chronic: Secondary | ICD-10-CM | POA: Diagnosis not present

## 2022-12-18 DIAGNOSIS — F332 Major depressive disorder, recurrent severe without psychotic features: Secondary | ICD-10-CM | POA: Diagnosis not present

## 2022-12-18 DIAGNOSIS — F411 Generalized anxiety disorder: Secondary | ICD-10-CM | POA: Diagnosis not present

## 2022-12-25 DIAGNOSIS — F332 Major depressive disorder, recurrent severe without psychotic features: Secondary | ICD-10-CM | POA: Diagnosis not present

## 2022-12-25 DIAGNOSIS — F411 Generalized anxiety disorder: Secondary | ICD-10-CM | POA: Diagnosis not present

## 2022-12-25 DIAGNOSIS — F4312 Post-traumatic stress disorder, chronic: Secondary | ICD-10-CM | POA: Diagnosis not present

## 2022-12-26 DIAGNOSIS — F411 Generalized anxiety disorder: Secondary | ICD-10-CM | POA: Diagnosis not present

## 2022-12-26 DIAGNOSIS — F4311 Post-traumatic stress disorder, acute: Secondary | ICD-10-CM | POA: Diagnosis not present

## 2022-12-26 DIAGNOSIS — F69 Unspecified disorder of adult personality and behavior: Secondary | ICD-10-CM | POA: Diagnosis not present

## 2023-01-05 ENCOUNTER — Other Ambulatory Visit: Payer: Self-pay | Admitting: Internal Medicine

## 2023-01-05 DIAGNOSIS — E1165 Type 2 diabetes mellitus with hyperglycemia: Secondary | ICD-10-CM

## 2023-01-08 DIAGNOSIS — F332 Major depressive disorder, recurrent severe without psychotic features: Secondary | ICD-10-CM | POA: Diagnosis not present

## 2023-01-08 DIAGNOSIS — F4312 Post-traumatic stress disorder, chronic: Secondary | ICD-10-CM | POA: Diagnosis not present

## 2023-01-08 DIAGNOSIS — F411 Generalized anxiety disorder: Secondary | ICD-10-CM | POA: Diagnosis not present

## 2023-01-15 DIAGNOSIS — F332 Major depressive disorder, recurrent severe without psychotic features: Secondary | ICD-10-CM | POA: Diagnosis not present

## 2023-01-15 DIAGNOSIS — F4312 Post-traumatic stress disorder, chronic: Secondary | ICD-10-CM | POA: Diagnosis not present

## 2023-01-15 DIAGNOSIS — F411 Generalized anxiety disorder: Secondary | ICD-10-CM | POA: Diagnosis not present

## 2023-01-21 ENCOUNTER — Encounter: Payer: Self-pay | Admitting: Internal Medicine

## 2023-01-21 ENCOUNTER — Ambulatory Visit: Payer: Medicare PPO | Admitting: Internal Medicine

## 2023-01-21 VITALS — BP 128/82 | HR 65 | Ht 72.0 in | Wt 140.8 lb

## 2023-01-21 DIAGNOSIS — E1165 Type 2 diabetes mellitus with hyperglycemia: Secondary | ICD-10-CM | POA: Diagnosis not present

## 2023-01-21 DIAGNOSIS — E782 Mixed hyperlipidemia: Secondary | ICD-10-CM | POA: Diagnosis not present

## 2023-01-21 DIAGNOSIS — R634 Abnormal weight loss: Secondary | ICD-10-CM | POA: Diagnosis not present

## 2023-01-21 DIAGNOSIS — Z794 Long term (current) use of insulin: Secondary | ICD-10-CM

## 2023-01-21 LAB — POCT GLYCOSYLATED HEMOGLOBIN (HGB A1C): Hemoglobin A1C: 5.8 % — AB (ref 4.0–5.6)

## 2023-01-21 MED ORDER — METFORMIN HCL 500 MG PO TABS: 500.0000 mg | ORAL_TABLET | Freq: Two times a day (BID) | ORAL | 1 refills | Status: DC

## 2023-01-21 NOTE — Progress Notes (Signed)
Patient ID: Meagan Moran, female   DOB: 1956/05/13, 67 y.o.   MRN: [id number]   HPI: Meagan Moran is a 67 y.o.-year-old female, initially referred by her PCP, Dr. Carol Ada, returning for follow-up for DM2, dx in 2005, prev. insulin-dependent, uncontrolled, without long-term complications. Last visit 6 months ago.  She is here with her husband who offers most of the history as patient is mostly nonverbal.  Interim history: She previously lost a significant amount of weight before getting dentures. No blurry vision, nausea, chest pain, swelling.  She does have urinary incontinence, per husband. She has severe PTSD with significant anxiety>> on Ketamine infusion, Xanax, Geodon. Now on Paroxetine.  Reviewed history: In 2020, husband was telling me that they had to take her insulin away and lock it as she was using too much insulin, even when sugars were only slightly above normal. Her sugars did improve after this measure.  Reviewed HbA1c levels: Lab Results  Component Value Date   HGBA1C 6.4 (A) 07/19/2022   HGBA1C 6.7 (A) 04/05/2022   HGBA1C 6.6 (A) 08/22/2021   HGBA1C 6.4 (A) 02/19/2021   HGBA1C 6.4 (A) 08/22/2020   HGBA1C 7.1 (A) 04/20/2020   HGBA1C 6.2 (A) 09/23/2019  07/01/2019: HbA1c 6.9% 09/16/2018: HbA1c 7.1%  She is on: - Metformin 500 mg 2x a day, with meals >> 500 mg with breakfast >> 500 mg 2x a day with meals (lunch and dinner) Prev. NovoLog as needed 6 units for very high blood sugars now.  She was previously on: - Tresiba 90 +/-20 units at bedtime >> stopped 05/2019. - Novolog ~10 units 3x a day, before meals  - Jardiance >> yeast inf's.  She checks her sugars more than 4 times a day with her freestyle libre CGM (with receiver):  Previously:     Lowest sugar was 30 >> ... 75 >> 100 >> 70s  >> 55; she has hypoglycemia awareness at 100. Highest sugar was 298 >> ... 221 >> 200 (pancakes) >> 239.  Glucometer: Freestyle Libre 2, One Touch  Pt's meals are:   - Breakfast: oatmeal >> pancakes - Lunch: cold cut meats + cheese puffs, 3 musketeers bar - Dinner: Different - Snacks: chocolate, juice; also snacks at night Tries to walk for exercise daily.  -No CKD, last BUN/creatinine:  08/29/2021: Glucose 126, BUN/creatinine 22/0.63, GFR 98 09/21/2020: Glucose 97, BUN/creatinine 19/0.59 Lab Results  Component Value Date   BUN 9 01/31/2020   CREATININE 0.65 01/31/2020  06/24/2019: 7/0.58, glucose 172 On Telmisartan.  -+ HL L; last set of lipids: Lab Results  Component Value Date   CHOL 207 (H) 04/05/2022   HDL 62.70 04/05/2022   LDLCALC 120 (H) 04/05/2022   TRIG 124.0 04/05/2022   CHOLHDL 3 04/05/2022  09/21/2020: 175/153/51/97 06/24/2019: 207/130/43/138 09/16/2018: 267/404/38/190 Not on pravastatin 20 b/c upset stomach.  Previously on Vascepa - stopped as she had GI sxs from it.  At last visit, I suggested another statin, but she did not start this.  - last eye exam was on 05/09/2022, no DR.  - no  numbness and tingling in her feet.  On her exam from 06/2019: Callused feet, onychomycosis, long nails; but with normal circulation and response to monofilament. Foot exam was done by PCP at last OV. She continues on Cymbalta.  She started Neurontin -for mental health.  Latest TSH is normal: Lab Results  Component Value Date   TSH 0.51 07/19/2022  08/21/2021: TSH 1.73 09/21/2020: TSH 0.66 06/24/2019: TSH 1.06  Per  neurology request, we checked her metanephrine/catecholamine levels at last visit-normal: Component     Latest Ref Rng 07/19/2022  Metanephrine, Pl     <=57 pg/mL <25   Normetanephrine, Pl     <=148 pg/mL 141   Total Metanephrines-Plasma     <=205 pg/mL 141   Epinephrine     pg/mL 37   Norepinephrine     pg/mL 585   Dopamine     pg/mL <10   Total Catecholamines     pg/mL 622    Pt has FH of DM in [place], [place], M aunt, M cousins.  ROS: + See HPI  I reviewed pt's medications, allergies, PMH, social hx, family hx, and  changes were documented in the history of present illness. Otherwise, unchanged from my initial visit note.  Past Medical History:  Diagnosis Date   Back pain    Diabetes mellitus, type II (HCC)    Heart murmur    Sleep apnea    Past Surgical History:  Procedure Laterality Date   CESAREAN SECTION     Social History   Socioeconomic History   Marital status: Married    Spouse name: Not on file   Number of children: 2   Years of education: Not on file   Highest education level: Not on file  Occupational History    Early retirement  Social Needs   Financial resource strain: Not on file   Food insecurity    Worry: Not on file    Inability: Not on file   Transportation needs    Medical: Not on file    Non-medical: Not on file  Tobacco Use   Smoking status: Current Every Day Smoker   Smokeless tobacco: Never Used  Substance and Sexual Activity   Alcohol use:  Once a year   Drug use: No   Current Outpatient Medications on File Prior to Visit  Medication Sig Dispense Refill   Acetylcysteine (NAC 600 PO) Take by mouth.     amLODipine (NORVASC) 5 MG tablet TAKE 1 TABLET EVERY DAY AT LUNCH     Continuous Blood Gluc Receiver (FREESTYLE LIBRE 2 READER SYSTM) DEVI 1 each by Does not apply route once for 1 dose. 1 each 0   Continuous Blood Gluc Sensor (FREESTYLE LIBRE 3 SENSOR) MISC 1 each by Does not apply route every 14 (fourteen) days. 6 each 3   hydrochlorothiazide (MICROZIDE) 12.5 MG capsule Take 12.5 mg by mouth daily as needed.     losartan (COZAAR) 100 MG tablet Take 100 mg by mouth daily.     MELATONIN PO Take 20 mg by mouth. Taking 1 tab in the evening and 1 tab before bedtime.     metFORMIN (GLUCOPHAGE) 500 MG tablet TAKE 1 TABLET BY MOUTH TWICE A DAY WITH FOOD 60 tablet 0   Multiple Vitamins-Minerals (ZINC PO) Take by mouth.     Probiotic Product (PROBIOTIC PO) Take by mouth.     VITAMIN D PO Take by mouth.     ziprasidone (GEODON) 20 MG capsule Take by mouth.     No  current facility-administered medications on file prior to visit.   Allergies  Allergen Reactions   Nsaids    Sulfa Antibiotics    Family History  Problem Relation Age of Onset   Anxiety disorder Mother    Anxiety disorder Maternal Grandmother    Anxiety disorder Daughter   She also has a history of HL and lung cancer in father; heart disease in mother.  PE: BP 128/82 (BP Location: Right Arm, Patient Position: Sitting, Cuff Size: Normal)   Pulse 65   Ht 6' (1.829 m)   Wt 140 lb 12.8 oz (63.9 kg)   SpO2 99%   BMI 19.10 kg/m  Wt Readings from Last 3 Encounters:  01/21/23 140 lb 12.8 oz (63.9 kg)  07/19/22 142 lb 3.2 oz (64.5 kg)  03/05/22 150 lb (68 kg)   Constitutional: Normal weight, in NAD Eyes: no exophthalmos ENT: no masses palpated in neck, no cervical lymphadenopathy Cardiovascular: RRR, No MRG Respiratory: CTA B Musculoskeletal: no deformities Skin: no rashes Neurological: no tremor with outstretched hands  ASSESSMENT: 1. DM2, insulin-dependent, uncontrolled, without long-term complications, but with hypo and hyperglycemia In 2020, I received a message from [name], PhD, her counselor, letting me know about the patient's anxiety/depression and the fact that she may overcorrect low blood sugars or eat a lot of sweets to try to prevent blood sugar crashes.  2. HL  3.  Significant weight loss  4.  Anxiety  PLAN:  1. Patient with longstanding type 2 diabetes, on low-dose metformin only.  In the past, she was on insulin but she was not eating much and lost a significant amount of weight and insulin was stopped due to overuse.  She started to eat better after she got her new denture but she was drinking a lot of milk before last visit and we discussed about stopping it.  At last visit, we reviewed her CGM data.  She had significant loss of data, but reviewing individual daily tracings, she had occasional higher blood sugars after breakfast especially when she was  eating pancakes with syrup.  I advised her to reduce the portion but ideally stop eating these.  We did not change her regimen otherwise.  I did suggest the freestyle libre 3 CGM. -HbA1c at last visit was better, at 6.4% CGM interpretation: -At today's visit, we reviewed her CGM downloads: It appears that 90% of values are in target range (goal >70%), while 2% are higher than 180 (goal <25%), and 8% are lower than 70 (goal <4%).  The calculated average blood sugar is 113.  The projected HbA1c for the next 3 months (GMI) is 6.0%. -Reviewing the CGM trends, sugars appear to be fluctuating within the target range, slightly higher after breakfast and dinner, but with quite a few lows mostly in the afternoon but also occasionally overnight, down to the 50s.  We discussed with patient's husband to try to check with the glucometer to confirm the hypoglycemia on CGM.  However, for now, I also advised him to stop the metformin with lunch and to even start the metformin with dinner if she continues to have lows. - I suggested to:  Patient Instructions  Please change: - Metformin 500 mg with dinner only, but stop if she still has lows  Please return in 6 months.   - we checked her HbA1c: 5.8% (lower) - advised to check sugars at different times of the day - 4x a day, rotating check times - advised for yearly eye exams >> she is UTD - return to clinic in 6 months  2. HL -Reviewed latest lipid panel from 03/2022: LDL above target, otherwise fractions at goal: Lab Results  Component Value Date   CHOL 207 (H) 04/05/2022   HDL 62.70 04/05/2022   LDLCALC 120 (H) 04/05/2022   TRIG 124.0 04/05/2022   CHOLHDL 3 04/05/2022  -She was previously on pravastatin but this was stopped due  to stomach discomfort.  Not on a statin now.  She tried Vascepa but she developed GI symptoms and she stopped it. -She was previously on a statin, pravastatin, but she developed stomach discomfort and stopped.  She tried Vascepa  but developed GI symptoms and stopped this medication, also. -Before last visit she was able to reduce the amount of milk from 1.5 to 0.5 gallons a week.  I advised her to try to stop it completely.  3.  Significant weight loss -Also significant amount of weight previously, when she had problems with her dentures, but weight plateaued at ~150 pounds afterwards and lost 8 more pounds before last visit. -At last visit we checked a TSH to make sure she was not thyrotoxic.  This was normal. -metformin is weight neutral >> but will reduce the dose - lost 2 lbs since last OV  Philemon Kingdom, MD PhD [facility]

## 2023-01-21 NOTE — Patient Instructions (Addendum)
Please change: - Metformin 500 mg with dinner only, but stop if she still has lows  Please return in 6 months.

## 2023-01-22 DIAGNOSIS — F332 Major depressive disorder, recurrent severe without psychotic features: Secondary | ICD-10-CM | POA: Diagnosis not present

## 2023-01-22 DIAGNOSIS — F411 Generalized anxiety disorder: Secondary | ICD-10-CM | POA: Diagnosis not present

## 2023-01-22 DIAGNOSIS — F4312 Post-traumatic stress disorder, chronic: Secondary | ICD-10-CM | POA: Diagnosis not present

## 2023-01-29 DIAGNOSIS — F411 Generalized anxiety disorder: Secondary | ICD-10-CM | POA: Diagnosis not present

## 2023-01-29 DIAGNOSIS — F4312 Post-traumatic stress disorder, chronic: Secondary | ICD-10-CM | POA: Diagnosis not present

## 2023-01-29 DIAGNOSIS — F332 Major depressive disorder, recurrent severe without psychotic features: Secondary | ICD-10-CM | POA: Diagnosis not present

## 2023-02-05 DIAGNOSIS — F332 Major depressive disorder, recurrent severe without psychotic features: Secondary | ICD-10-CM | POA: Diagnosis not present

## 2023-02-05 DIAGNOSIS — F411 Generalized anxiety disorder: Secondary | ICD-10-CM | POA: Diagnosis not present

## 2023-02-05 DIAGNOSIS — F4312 Post-traumatic stress disorder, chronic: Secondary | ICD-10-CM | POA: Diagnosis not present

## 2023-02-06 DIAGNOSIS — F4311 Post-traumatic stress disorder, acute: Secondary | ICD-10-CM | POA: Diagnosis not present

## 2023-02-06 DIAGNOSIS — F69 Unspecified disorder of adult personality and behavior: Secondary | ICD-10-CM | POA: Diagnosis not present

## 2023-02-06 DIAGNOSIS — F411 Generalized anxiety disorder: Secondary | ICD-10-CM | POA: Diagnosis not present

## 2023-02-08 ENCOUNTER — Other Ambulatory Visit: Payer: Self-pay | Admitting: Internal Medicine

## 2023-02-08 DIAGNOSIS — E1165 Type 2 diabetes mellitus with hyperglycemia: Secondary | ICD-10-CM

## 2023-02-12 DIAGNOSIS — F4312 Post-traumatic stress disorder, chronic: Secondary | ICD-10-CM | POA: Diagnosis not present

## 2023-02-12 DIAGNOSIS — F411 Generalized anxiety disorder: Secondary | ICD-10-CM | POA: Diagnosis not present

## 2023-02-12 DIAGNOSIS — F332 Major depressive disorder, recurrent severe without psychotic features: Secondary | ICD-10-CM | POA: Diagnosis not present

## 2023-02-19 DIAGNOSIS — F411 Generalized anxiety disorder: Secondary | ICD-10-CM | POA: Diagnosis not present

## 2023-02-19 DIAGNOSIS — F4312 Post-traumatic stress disorder, chronic: Secondary | ICD-10-CM | POA: Diagnosis not present

## 2023-02-19 DIAGNOSIS — F332 Major depressive disorder, recurrent severe without psychotic features: Secondary | ICD-10-CM | POA: Diagnosis not present

## 2023-02-26 DIAGNOSIS — F332 Major depressive disorder, recurrent severe without psychotic features: Secondary | ICD-10-CM | POA: Diagnosis not present

## 2023-02-26 DIAGNOSIS — F4312 Post-traumatic stress disorder, chronic: Secondary | ICD-10-CM | POA: Diagnosis not present

## 2023-02-26 DIAGNOSIS — F411 Generalized anxiety disorder: Secondary | ICD-10-CM | POA: Diagnosis not present

## 2023-03-05 DIAGNOSIS — F332 Major depressive disorder, recurrent severe without psychotic features: Secondary | ICD-10-CM | POA: Diagnosis not present

## 2023-03-05 DIAGNOSIS — F411 Generalized anxiety disorder: Secondary | ICD-10-CM | POA: Diagnosis not present

## 2023-03-05 DIAGNOSIS — F4312 Post-traumatic stress disorder, chronic: Secondary | ICD-10-CM | POA: Diagnosis not present

## 2023-03-10 DIAGNOSIS — E119 Type 2 diabetes mellitus without complications: Secondary | ICD-10-CM | POA: Diagnosis not present

## 2023-03-10 DIAGNOSIS — I10 Essential (primary) hypertension: Secondary | ICD-10-CM | POA: Diagnosis not present

## 2023-03-10 DIAGNOSIS — F332 Major depressive disorder, recurrent severe without psychotic features: Secondary | ICD-10-CM | POA: Diagnosis not present

## 2023-03-10 DIAGNOSIS — F4311 Post-traumatic stress disorder, acute: Secondary | ICD-10-CM | POA: Diagnosis not present

## 2023-03-10 DIAGNOSIS — F419 Anxiety disorder, unspecified: Secondary | ICD-10-CM | POA: Diagnosis not present

## 2023-03-10 DIAGNOSIS — E1165 Type 2 diabetes mellitus with hyperglycemia: Secondary | ICD-10-CM | POA: Diagnosis not present

## 2023-03-10 DIAGNOSIS — F4310 Post-traumatic stress disorder, unspecified: Secondary | ICD-10-CM | POA: Diagnosis not present

## 2023-03-10 DIAGNOSIS — F69 Unspecified disorder of adult personality and behavior: Secondary | ICD-10-CM | POA: Diagnosis not present

## 2023-03-10 DIAGNOSIS — F411 Generalized anxiety disorder: Secondary | ICD-10-CM | POA: Diagnosis not present

## 2023-03-12 DIAGNOSIS — F4312 Post-traumatic stress disorder, chronic: Secondary | ICD-10-CM | POA: Diagnosis not present

## 2023-03-12 DIAGNOSIS — F332 Major depressive disorder, recurrent severe without psychotic features: Secondary | ICD-10-CM | POA: Diagnosis not present

## 2023-03-12 DIAGNOSIS — F411 Generalized anxiety disorder: Secondary | ICD-10-CM | POA: Diagnosis not present

## 2023-03-19 DIAGNOSIS — F332 Major depressive disorder, recurrent severe without psychotic features: Secondary | ICD-10-CM | POA: Diagnosis not present

## 2023-03-19 DIAGNOSIS — F4312 Post-traumatic stress disorder, chronic: Secondary | ICD-10-CM | POA: Diagnosis not present

## 2023-03-19 DIAGNOSIS — F411 Generalized anxiety disorder: Secondary | ICD-10-CM | POA: Diagnosis not present

## 2023-03-26 DIAGNOSIS — F332 Major depressive disorder, recurrent severe without psychotic features: Secondary | ICD-10-CM | POA: Diagnosis not present

## 2023-03-26 DIAGNOSIS — F411 Generalized anxiety disorder: Secondary | ICD-10-CM | POA: Diagnosis not present

## 2023-03-26 DIAGNOSIS — F4312 Post-traumatic stress disorder, chronic: Secondary | ICD-10-CM | POA: Diagnosis not present

## 2023-04-02 DIAGNOSIS — F332 Major depressive disorder, recurrent severe without psychotic features: Secondary | ICD-10-CM | POA: Diagnosis not present

## 2023-04-02 DIAGNOSIS — F411 Generalized anxiety disorder: Secondary | ICD-10-CM | POA: Diagnosis not present

## 2023-04-02 DIAGNOSIS — F4312 Post-traumatic stress disorder, chronic: Secondary | ICD-10-CM | POA: Diagnosis not present

## 2023-04-09 DIAGNOSIS — F332 Major depressive disorder, recurrent severe without psychotic features: Secondary | ICD-10-CM | POA: Diagnosis not present

## 2023-04-09 DIAGNOSIS — F411 Generalized anxiety disorder: Secondary | ICD-10-CM | POA: Diagnosis not present

## 2023-04-09 DIAGNOSIS — F4312 Post-traumatic stress disorder, chronic: Secondary | ICD-10-CM | POA: Diagnosis not present

## 2023-04-16 DIAGNOSIS — F332 Major depressive disorder, recurrent severe without psychotic features: Secondary | ICD-10-CM | POA: Diagnosis not present

## 2023-04-16 DIAGNOSIS — F4312 Post-traumatic stress disorder, chronic: Secondary | ICD-10-CM | POA: Diagnosis not present

## 2023-04-16 DIAGNOSIS — F411 Generalized anxiety disorder: Secondary | ICD-10-CM | POA: Diagnosis not present

## 2023-04-23 DIAGNOSIS — F411 Generalized anxiety disorder: Secondary | ICD-10-CM | POA: Diagnosis not present

## 2023-04-23 DIAGNOSIS — F332 Major depressive disorder, recurrent severe without psychotic features: Secondary | ICD-10-CM | POA: Diagnosis not present

## 2023-04-23 DIAGNOSIS — F4312 Post-traumatic stress disorder, chronic: Secondary | ICD-10-CM | POA: Diagnosis not present

## 2023-04-30 DIAGNOSIS — F4312 Post-traumatic stress disorder, chronic: Secondary | ICD-10-CM | POA: Diagnosis not present

## 2023-04-30 DIAGNOSIS — F411 Generalized anxiety disorder: Secondary | ICD-10-CM | POA: Diagnosis not present

## 2023-04-30 DIAGNOSIS — F332 Major depressive disorder, recurrent severe without psychotic features: Secondary | ICD-10-CM | POA: Diagnosis not present

## 2023-05-06 DIAGNOSIS — F69 Unspecified disorder of adult personality and behavior: Secondary | ICD-10-CM | POA: Diagnosis not present

## 2023-05-06 DIAGNOSIS — F4311 Post-traumatic stress disorder, acute: Secondary | ICD-10-CM | POA: Diagnosis not present

## 2023-05-06 DIAGNOSIS — F411 Generalized anxiety disorder: Secondary | ICD-10-CM | POA: Diagnosis not present

## 2023-05-07 DIAGNOSIS — F332 Major depressive disorder, recurrent severe without psychotic features: Secondary | ICD-10-CM | POA: Diagnosis not present

## 2023-05-07 DIAGNOSIS — F4312 Post-traumatic stress disorder, chronic: Secondary | ICD-10-CM | POA: Diagnosis not present

## 2023-05-07 DIAGNOSIS — F411 Generalized anxiety disorder: Secondary | ICD-10-CM | POA: Diagnosis not present

## 2023-05-14 DIAGNOSIS — F332 Major depressive disorder, recurrent severe without psychotic features: Secondary | ICD-10-CM | POA: Diagnosis not present

## 2023-05-14 DIAGNOSIS — F4312 Post-traumatic stress disorder, chronic: Secondary | ICD-10-CM | POA: Diagnosis not present

## 2023-05-14 DIAGNOSIS — F411 Generalized anxiety disorder: Secondary | ICD-10-CM | POA: Diagnosis not present

## 2023-05-21 DIAGNOSIS — F411 Generalized anxiety disorder: Secondary | ICD-10-CM | POA: Diagnosis not present

## 2023-05-21 DIAGNOSIS — F332 Major depressive disorder, recurrent severe without psychotic features: Secondary | ICD-10-CM | POA: Diagnosis not present

## 2023-05-21 DIAGNOSIS — F4312 Post-traumatic stress disorder, chronic: Secondary | ICD-10-CM | POA: Diagnosis not present

## 2023-06-04 ENCOUNTER — Telehealth: Payer: Self-pay

## 2023-06-04 DIAGNOSIS — F4312 Post-traumatic stress disorder, chronic: Secondary | ICD-10-CM | POA: Diagnosis not present

## 2023-06-04 DIAGNOSIS — F411 Generalized anxiety disorder: Secondary | ICD-10-CM | POA: Diagnosis not present

## 2023-06-04 DIAGNOSIS — F332 Major depressive disorder, recurrent severe without psychotic features: Secondary | ICD-10-CM | POA: Diagnosis not present

## 2023-06-04 NOTE — Telephone Encounter (Signed)
Error

## 2023-06-08 DIAGNOSIS — E1165 Type 2 diabetes mellitus with hyperglycemia: Secondary | ICD-10-CM | POA: Diagnosis not present

## 2023-06-09 DIAGNOSIS — I10 Essential (primary) hypertension: Secondary | ICD-10-CM | POA: Diagnosis not present

## 2023-06-09 DIAGNOSIS — F332 Major depressive disorder, recurrent severe without psychotic features: Secondary | ICD-10-CM | POA: Diagnosis not present

## 2023-06-09 DIAGNOSIS — E1165 Type 2 diabetes mellitus with hyperglycemia: Secondary | ICD-10-CM | POA: Diagnosis not present

## 2023-06-09 DIAGNOSIS — Z0000 Encounter for general adult medical examination without abnormal findings: Secondary | ICD-10-CM | POA: Diagnosis not present

## 2023-06-09 DIAGNOSIS — F4310 Post-traumatic stress disorder, unspecified: Secondary | ICD-10-CM | POA: Diagnosis not present

## 2023-06-09 DIAGNOSIS — F419 Anxiety disorder, unspecified: Secondary | ICD-10-CM | POA: Diagnosis not present

## 2023-06-11 DIAGNOSIS — F332 Major depressive disorder, recurrent severe without psychotic features: Secondary | ICD-10-CM | POA: Diagnosis not present

## 2023-06-11 DIAGNOSIS — F411 Generalized anxiety disorder: Secondary | ICD-10-CM | POA: Diagnosis not present

## 2023-06-11 DIAGNOSIS — F4312 Post-traumatic stress disorder, chronic: Secondary | ICD-10-CM | POA: Diagnosis not present

## 2023-06-18 DIAGNOSIS — F411 Generalized anxiety disorder: Secondary | ICD-10-CM | POA: Diagnosis not present

## 2023-06-18 DIAGNOSIS — F4312 Post-traumatic stress disorder, chronic: Secondary | ICD-10-CM | POA: Diagnosis not present

## 2023-06-18 DIAGNOSIS — F332 Major depressive disorder, recurrent severe without psychotic features: Secondary | ICD-10-CM | POA: Diagnosis not present

## 2023-06-19 DIAGNOSIS — F69 Unspecified disorder of adult personality and behavior: Secondary | ICD-10-CM | POA: Diagnosis not present

## 2023-06-19 DIAGNOSIS — F411 Generalized anxiety disorder: Secondary | ICD-10-CM | POA: Diagnosis not present

## 2023-06-19 DIAGNOSIS — F4311 Post-traumatic stress disorder, acute: Secondary | ICD-10-CM | POA: Diagnosis not present

## 2023-06-25 DIAGNOSIS — F332 Major depressive disorder, recurrent severe without psychotic features: Secondary | ICD-10-CM | POA: Diagnosis not present

## 2023-06-25 DIAGNOSIS — F4312 Post-traumatic stress disorder, chronic: Secondary | ICD-10-CM | POA: Diagnosis not present

## 2023-06-25 DIAGNOSIS — F411 Generalized anxiety disorder: Secondary | ICD-10-CM | POA: Diagnosis not present

## 2023-07-02 DIAGNOSIS — F4312 Post-traumatic stress disorder, chronic: Secondary | ICD-10-CM | POA: Diagnosis not present

## 2023-07-02 DIAGNOSIS — F332 Major depressive disorder, recurrent severe without psychotic features: Secondary | ICD-10-CM | POA: Diagnosis not present

## 2023-07-02 DIAGNOSIS — F411 Generalized anxiety disorder: Secondary | ICD-10-CM | POA: Diagnosis not present

## 2023-07-08 DIAGNOSIS — F332 Major depressive disorder, recurrent severe without psychotic features: Secondary | ICD-10-CM | POA: Diagnosis not present

## 2023-07-08 DIAGNOSIS — F411 Generalized anxiety disorder: Secondary | ICD-10-CM | POA: Diagnosis not present

## 2023-07-08 DIAGNOSIS — F4310 Post-traumatic stress disorder, unspecified: Secondary | ICD-10-CM | POA: Diagnosis not present

## 2023-07-08 DIAGNOSIS — F489 Nonpsychotic mental disorder, unspecified: Secondary | ICD-10-CM | POA: Diagnosis not present

## 2023-07-09 DIAGNOSIS — F332 Major depressive disorder, recurrent severe without psychotic features: Secondary | ICD-10-CM | POA: Diagnosis not present

## 2023-07-09 DIAGNOSIS — F4312 Post-traumatic stress disorder, chronic: Secondary | ICD-10-CM | POA: Diagnosis not present

## 2023-07-09 DIAGNOSIS — F411 Generalized anxiety disorder: Secondary | ICD-10-CM | POA: Diagnosis not present

## 2023-07-16 DIAGNOSIS — F411 Generalized anxiety disorder: Secondary | ICD-10-CM | POA: Diagnosis not present

## 2023-07-16 DIAGNOSIS — F332 Major depressive disorder, recurrent severe without psychotic features: Secondary | ICD-10-CM | POA: Diagnosis not present

## 2023-07-16 DIAGNOSIS — F4312 Post-traumatic stress disorder, chronic: Secondary | ICD-10-CM | POA: Diagnosis not present

## 2023-07-21 DIAGNOSIS — G4733 Obstructive sleep apnea (adult) (pediatric): Secondary | ICD-10-CM | POA: Diagnosis not present

## 2023-07-22 DIAGNOSIS — G4733 Obstructive sleep apnea (adult) (pediatric): Secondary | ICD-10-CM | POA: Diagnosis not present

## 2023-07-23 DIAGNOSIS — F4312 Post-traumatic stress disorder, chronic: Secondary | ICD-10-CM | POA: Diagnosis not present

## 2023-07-23 DIAGNOSIS — F332 Major depressive disorder, recurrent severe without psychotic features: Secondary | ICD-10-CM | POA: Diagnosis not present

## 2023-07-23 DIAGNOSIS — F411 Generalized anxiety disorder: Secondary | ICD-10-CM | POA: Diagnosis not present

## 2023-07-30 DIAGNOSIS — F4312 Post-traumatic stress disorder, chronic: Secondary | ICD-10-CM | POA: Diagnosis not present

## 2023-07-30 DIAGNOSIS — F332 Major depressive disorder, recurrent severe without psychotic features: Secondary | ICD-10-CM | POA: Diagnosis not present

## 2023-07-30 DIAGNOSIS — F411 Generalized anxiety disorder: Secondary | ICD-10-CM | POA: Diagnosis not present

## 2023-08-06 DIAGNOSIS — F332 Major depressive disorder, recurrent severe without psychotic features: Secondary | ICD-10-CM | POA: Diagnosis not present

## 2023-08-06 DIAGNOSIS — F411 Generalized anxiety disorder: Secondary | ICD-10-CM | POA: Diagnosis not present

## 2023-08-06 DIAGNOSIS — F4312 Post-traumatic stress disorder, chronic: Secondary | ICD-10-CM | POA: Diagnosis not present

## 2023-08-12 ENCOUNTER — Other Ambulatory Visit: Payer: Self-pay | Admitting: Internal Medicine

## 2023-08-12 DIAGNOSIS — Z794 Long term (current) use of insulin: Secondary | ICD-10-CM

## 2023-08-13 DIAGNOSIS — F411 Generalized anxiety disorder: Secondary | ICD-10-CM | POA: Diagnosis not present

## 2023-08-13 DIAGNOSIS — F4312 Post-traumatic stress disorder, chronic: Secondary | ICD-10-CM | POA: Diagnosis not present

## 2023-08-13 DIAGNOSIS — F332 Major depressive disorder, recurrent severe without psychotic features: Secondary | ICD-10-CM | POA: Diagnosis not present

## 2023-08-20 DIAGNOSIS — F411 Generalized anxiety disorder: Secondary | ICD-10-CM | POA: Diagnosis not present

## 2023-08-20 DIAGNOSIS — F332 Major depressive disorder, recurrent severe without psychotic features: Secondary | ICD-10-CM | POA: Diagnosis not present

## 2023-08-20 DIAGNOSIS — F4312 Post-traumatic stress disorder, chronic: Secondary | ICD-10-CM | POA: Diagnosis not present

## 2023-08-27 DIAGNOSIS — F411 Generalized anxiety disorder: Secondary | ICD-10-CM | POA: Diagnosis not present

## 2023-08-27 DIAGNOSIS — F332 Major depressive disorder, recurrent severe without psychotic features: Secondary | ICD-10-CM | POA: Diagnosis not present

## 2023-08-27 DIAGNOSIS — F4312 Post-traumatic stress disorder, chronic: Secondary | ICD-10-CM | POA: Diagnosis not present

## 2023-09-03 DIAGNOSIS — F411 Generalized anxiety disorder: Secondary | ICD-10-CM | POA: Diagnosis not present

## 2023-09-03 DIAGNOSIS — F332 Major depressive disorder, recurrent severe without psychotic features: Secondary | ICD-10-CM | POA: Diagnosis not present

## 2023-09-03 DIAGNOSIS — F4312 Post-traumatic stress disorder, chronic: Secondary | ICD-10-CM | POA: Diagnosis not present

## 2023-09-08 ENCOUNTER — Other Ambulatory Visit: Payer: Self-pay | Admitting: Internal Medicine

## 2023-09-08 DIAGNOSIS — E1165 Type 2 diabetes mellitus with hyperglycemia: Secondary | ICD-10-CM

## 2023-09-10 DIAGNOSIS — F332 Major depressive disorder, recurrent severe without psychotic features: Secondary | ICD-10-CM | POA: Diagnosis not present

## 2023-09-10 DIAGNOSIS — F4312 Post-traumatic stress disorder, chronic: Secondary | ICD-10-CM | POA: Diagnosis not present

## 2023-09-10 DIAGNOSIS — F411 Generalized anxiety disorder: Secondary | ICD-10-CM | POA: Diagnosis not present

## 2023-09-11 DIAGNOSIS — F332 Major depressive disorder, recurrent severe without psychotic features: Secondary | ICD-10-CM | POA: Diagnosis not present

## 2023-09-11 DIAGNOSIS — F411 Generalized anxiety disorder: Secondary | ICD-10-CM | POA: Diagnosis not present

## 2023-09-11 DIAGNOSIS — I10 Essential (primary) hypertension: Secondary | ICD-10-CM | POA: Diagnosis not present

## 2023-09-11 DIAGNOSIS — F489 Nonpsychotic mental disorder, unspecified: Secondary | ICD-10-CM | POA: Diagnosis not present

## 2023-09-11 DIAGNOSIS — F4310 Post-traumatic stress disorder, unspecified: Secondary | ICD-10-CM | POA: Diagnosis not present

## 2023-09-17 DIAGNOSIS — F332 Major depressive disorder, recurrent severe without psychotic features: Secondary | ICD-10-CM | POA: Diagnosis not present

## 2023-09-17 DIAGNOSIS — F411 Generalized anxiety disorder: Secondary | ICD-10-CM | POA: Diagnosis not present

## 2023-09-17 DIAGNOSIS — F4312 Post-traumatic stress disorder, chronic: Secondary | ICD-10-CM | POA: Diagnosis not present

## 2023-09-24 DIAGNOSIS — F4312 Post-traumatic stress disorder, chronic: Secondary | ICD-10-CM | POA: Diagnosis not present

## 2023-09-24 DIAGNOSIS — F411 Generalized anxiety disorder: Secondary | ICD-10-CM | POA: Diagnosis not present

## 2023-09-24 DIAGNOSIS — F332 Major depressive disorder, recurrent severe without psychotic features: Secondary | ICD-10-CM | POA: Diagnosis not present

## 2023-10-01 DIAGNOSIS — F332 Major depressive disorder, recurrent severe without psychotic features: Secondary | ICD-10-CM | POA: Diagnosis not present

## 2023-10-01 DIAGNOSIS — F4312 Post-traumatic stress disorder, chronic: Secondary | ICD-10-CM | POA: Diagnosis not present

## 2023-10-01 DIAGNOSIS — F411 Generalized anxiety disorder: Secondary | ICD-10-CM | POA: Diagnosis not present

## 2023-10-08 ENCOUNTER — Encounter: Payer: Self-pay | Admitting: Psychiatry

## 2023-10-08 DIAGNOSIS — F4312 Post-traumatic stress disorder, chronic: Secondary | ICD-10-CM | POA: Diagnosis not present

## 2023-10-08 DIAGNOSIS — F332 Major depressive disorder, recurrent severe without psychotic features: Secondary | ICD-10-CM | POA: Diagnosis not present

## 2023-10-08 DIAGNOSIS — F411 Generalized anxiety disorder: Secondary | ICD-10-CM | POA: Diagnosis not present

## 2023-10-15 DIAGNOSIS — F4312 Post-traumatic stress disorder, chronic: Secondary | ICD-10-CM | POA: Diagnosis not present

## 2023-10-15 DIAGNOSIS — F411 Generalized anxiety disorder: Secondary | ICD-10-CM | POA: Diagnosis not present

## 2023-10-15 DIAGNOSIS — F332 Major depressive disorder, recurrent severe without psychotic features: Secondary | ICD-10-CM | POA: Diagnosis not present

## 2023-10-22 ENCOUNTER — Telehealth: Payer: Self-pay

## 2023-10-22 DIAGNOSIS — F4312 Post-traumatic stress disorder, chronic: Secondary | ICD-10-CM | POA: Diagnosis not present

## 2023-10-22 DIAGNOSIS — F332 Major depressive disorder, recurrent severe without psychotic features: Secondary | ICD-10-CM | POA: Diagnosis not present

## 2023-10-22 DIAGNOSIS — F411 Generalized anxiety disorder: Secondary | ICD-10-CM | POA: Diagnosis not present

## 2023-10-22 NOTE — Telephone Encounter (Signed)
Pt is not able to receive a shipment for DM supplies until she is seen. Last appt was 12/2022. Please get pt scheduled.

## 2023-10-27 DIAGNOSIS — F332 Major depressive disorder, recurrent severe without psychotic features: Secondary | ICD-10-CM | POA: Diagnosis not present

## 2023-10-27 DIAGNOSIS — I10 Essential (primary) hypertension: Secondary | ICD-10-CM | POA: Diagnosis not present

## 2023-10-28 ENCOUNTER — Encounter: Payer: Self-pay | Admitting: Internal Medicine

## 2023-10-28 ENCOUNTER — Ambulatory Visit: Payer: Medicare PPO | Admitting: Internal Medicine

## 2023-10-28 VITALS — BP 120/70 | HR 69 | Ht 72.0 in | Wt 169.2 lb

## 2023-10-28 DIAGNOSIS — E1165 Type 2 diabetes mellitus with hyperglycemia: Secondary | ICD-10-CM

## 2023-10-28 DIAGNOSIS — Z794 Long term (current) use of insulin: Secondary | ICD-10-CM | POA: Diagnosis not present

## 2023-10-28 DIAGNOSIS — R634 Abnormal weight loss: Secondary | ICD-10-CM | POA: Diagnosis not present

## 2023-10-28 DIAGNOSIS — E782 Mixed hyperlipidemia: Secondary | ICD-10-CM | POA: Diagnosis not present

## 2023-10-28 LAB — POCT GLYCOSYLATED HEMOGLOBIN (HGB A1C): Hemoglobin A1C: 6.8 % — AB (ref 4.0–5.6)

## 2023-10-28 MED ORDER — METFORMIN HCL 500 MG PO TABS: 500.0000 mg | ORAL_TABLET | Freq: Two times a day (BID) | ORAL | 3 refills | Status: DC

## 2023-10-28 NOTE — Patient Instructions (Addendum)
Please increase: - Metformin 500 mg 2x a day with meals  Please stop at the lab.  Please return in 4 months.

## 2023-10-28 NOTE — Progress Notes (Signed)
Patient ID: Meagan Moran, female   DOB: December 11, 1955, 67 y.o.   MRN: [id number]   HPI: Meagan Moran is a 67 y.o.-year-old female, initially referred by her PCP, Dr. Merri Brunette, returning for follow-up for DM2, dx in 2005, prev. insulin-dependent, uncontrolled, without long-term complications. Last visit 9 months ago.  She is here with her husband who offers most of the history as patient is mostly nonverbal. We scheduled this appointment urgently as [facility] did not send her sensor supplies until seen in clinic.  Interim history: She previously lost a significant amount of weight before getting dentures.  She did start to gain weight back since last visit. No blurry vision, nausea, chest pain, swelling.  She has urinary incontinence. She has severe PTSD with significant anxiety >> she was previously on on Ketamine infusion, Xanax, Geodon, Paroxetine, Abilify.  It is not clear exactly what medications she is taking now, but husband mentions that he will send [place] a list with her psychotropic medications.  Reviewed history: In 2020, husband was telling me that they had to take her insulin away and lock it as she was using too much insulin, even when sugars were only slightly above normal. Her sugars did improve after this measure.  Reviewed HbA1c levels:  Lab Results  Component Value Date   HGBA1C 5.8 (A) 01/21/2023   HGBA1C 6.4 (A) 07/19/2022   HGBA1C 6.7 (A) 04/05/2022   HGBA1C 6.6 (A) 08/22/2021   HGBA1C 6.4 (A) 02/19/2021   HGBA1C 6.4 (A) 08/22/2020   HGBA1C 7.1 (A) 04/20/2020   HGBA1C 6.2 (A) 09/23/2019  07/01/2019: HbA1c 6.9% 09/16/2018: HbA1c 7.1%  She is on: - Metformin 500 mg 2x a day, with meals >> 500 mg with breakfast >> 500 mg 2x a day with meals (lunch and dinner) >> 500 mg 1x a day Prev. NovoLog as needed 6 units for very high blood sugars now.  She was previously on: - Tresiba 90 +/-20 units at bedtime >> stopped 05/2019. - Novolog ~10 units 3x a day, before meals   - Jardiance >> yeast inf's.  She checks her sugars more than 4 times a day with her freestyle libre CGM (with receiver) - but off as she did not have supplies: - am: 130-150 - 2h after b'fast: ? - lunch: 140-150 - 2h after lunch: 69-201, 220 - dinner: >> ? - 2h after dinner: ave: 180 - bedtime: ?  Prev.:  Previously:   Lowest sugar was 30 >> ... 70s  >> 55 >> 16; she has hypoglycemia awareness at 100. Highest sugar was 298 >> ... 200 (pancakes) >> 239 >> 200.  Glucometer: Freestyle Libre 2, One Touch  Pt's meals are:  - Breakfast: oatmeal >> pancakes - Lunch: cold cut meats + cheese puffs, 3 musketeers bar - Dinner: Different - Snacks: chocolate, juice; also snacks at night Tries to walk for exercise daily.  -No CKD, last BUN/creatinine:    Lab Results  Component Value Date   BUN 9 01/31/2020   CREATININE 0.65 01/31/2020  No results found for: "MICRALBCREAT" On Losartan.  -+ HL L; last set of lipids: Lab Results  Component Value Date   CHOL 207 (H) 04/05/2022   HDL 62.70 04/05/2022   LDLCALC 120 (H) 04/05/2022   TRIG 124.0 04/05/2022   CHOLHDL 3 04/05/2022  09/21/2020: 175/153/51/97 06/24/2019: 207/130/43/138 09/16/2018: 267/404/38/190 Not on pravastatin 20 b/c upset stomach.  Previously on Vascepa - stopped as she had GI sxs from it.  At last visit,  I suggested another statin, but she did not start this.  - last eye exam was on 05/09/2022, no DR.  - no  numbness and tingling in her feet.  On her exam from 06/2019: Callused feet, onychomycosis, long nails; but with normal circulation and response to monofilament. Foot exam was done by PCP at last OV. She continues on Cymbalta.  She started Neurontin -for mental health.  Latest TSH is normal: Lab Results  Component Value Date   TSH 0.51 07/19/2022  08/21/2021: TSH 1.73 09/21/2020: TSH 0.66 06/24/2019: TSH 1.06  Per neurology request, we checked her metanephrine/catecholamine levels at last  visit-normal: Component     Latest Ref Rng 07/19/2022  Metanephrine, Pl     <=57 pg/mL <25   Normetanephrine, Pl     <=148 pg/mL 141   Total Metanephrines-Plasma     <=205 pg/mL 141   Epinephrine     pg/mL 37   Norepinephrine     pg/mL 585   Dopamine     pg/mL <10   Total Catecholamines     pg/mL 622    Pt has FH of DM in [place], [place], M aunt, M cousins.  ROS: + See HPI  I reviewed pt's medications, allergies, PMH, social hx, family hx, and changes were documented in the history of present illness. Otherwise, unchanged from my initial visit note.  Past Medical History:  Diagnosis Date   Back pain    Diabetes mellitus, type II (HCC)    Heart murmur    Sleep apnea    Past Surgical History:  Procedure Laterality Date   CESAREAN SECTION     Social History   Socioeconomic History   Marital status: Married    Spouse name: Not on file   Number of children: 2   Years of education: Not on file   Highest education level: Not on file  Occupational History    Early retirement  Social Needs   Financial resource strain: Not on file   Food insecurity    Worry: Not on file    Inability: Not on file   Transportation needs    Medical: Not on file    Non-medical: Not on file  Tobacco Use   Smoking status: Current Every Day Smoker   Smokeless tobacco: Never Used  Substance and Sexual Activity   Alcohol use:  Once a year   Drug use: No   Current Outpatient Medications on File Prior to Visit  Medication Sig Dispense Refill   Acetylcysteine (NAC 600 PO) Take by mouth.     amLODipine (NORVASC) 5 MG tablet TAKE 1 TABLET EVERY DAY AT LUNCH     Continuous Blood Gluc Sensor (FREESTYLE LIBRE 3 SENSOR) MISC 1 each by Does not apply route every 14 (fourteen) days. 6 each 3   hydrochlorothiazide (MICROZIDE) 12.5 MG capsule Take 12.5 mg by mouth daily as needed. (Patient not taking: Reported on 01/21/2023)     losartan (COZAAR) 100 MG tablet Take 100 mg by mouth daily.     MELATONIN PO  Take 20 mg by mouth. Taking 1 tab in the evening and 1 tab before bedtime.     metFORMIN (GLUCOPHAGE) 500 MG tablet TAKE 1 TABLET BY MOUTH DAILY AT 6AM 90 tablet 0   Multiple Vitamins-Minerals (ZINC PO) Take by mouth.     Probiotic Product (PROBIOTIC PO) Take by mouth.     VITAMIN D PO Take by mouth.     ziprasidone (GEODON) 20 MG capsule Take by mouth. (  Patient not taking: Reported on 01/21/2023)     No current facility-administered medications on file prior to visit.   Allergies  Allergen Reactions   Atorvastatin Nausea And Vomiting   Empagliflozin Other (See Comments)   Nsaids    Penicillin G Hives   Pravastatin Nausea And Vomiting   Rosuvastatin Calcium     Other Reaction(s): upset stomach   Sulfa Antibiotics    Family History  Problem Relation Age of Onset   Anxiety disorder Mother    Anxiety disorder Maternal Grandmother    Anxiety disorder Daughter   She also has a history of HL and lung cancer in father; heart disease in mother.  PE: BP 120/70   Pulse 69   Ht 6' (1.829 m)   Wt 169 lb 3.2 oz (76.7 kg)   SpO2 97%   BMI 22.95 kg/m  Wt Readings from Last 3 Encounters:  10/28/23 169 lb 3.2 oz (76.7 kg)  01/21/23 140 lb 12.8 oz (63.9 kg)  07/19/22 142 lb 3.2 oz (64.5 kg)   Constitutional: Normal weight, in NAD Eyes: no exophthalmos ENT: no masses palpated in neck, no cervical lymphadenopathy Cardiovascular: RRR, No MRG Respiratory: CTA B Musculoskeletal: no deformities Skin: no rashes Neurological: no tremor with outstretched hands  ASSESSMENT: 1. DM2, insulin-dependent, uncontrolled, without long-term complications, but with hypo and hyperglycemia In 2020, I received a message from Robley Fries, PhD, her counselor, letting me know about the patient's anxiety/depression and the fact that she may overcorrect low blood sugars or eat a lot of sweets to try to prevent blood sugar crashes.  2. HL  3.  Significant weight loss  4.  Anxiety  PLAN:  1. Patient  with longstanding type 2 diabetes, on low-dose metformin only.  In the past, she was on insulin but she was not eating much and lost a significant amount of weight and insulin was stopped due to overuse.  At last visit, HbA1c was lower, at 5.8%.  At that time, sugars are fluctuating within the target range, slightly higher after breakfast and dinner but with quite a few lows mostly in the afternoon and occasionally overnight, down to the 50s.  I advised the patient's husband to check hypoglycemia on CGM with her glucometer just to make sure that the values were real.  We reduced the metformin dose at that time and advised him to stop this completely if she continues to have lows. -She had another HbA1c in 05/2023 and this was higher, at 6.5%. -At today's visit, she is off the sensor and she does not have supplies.  To be sent supplies, she had to schedule an appointment here in clinic.  They stopped the sensor approximately 40 days ago and are checking manually.  Sugars appear to be higher than target in the morning and occasionally also higher later in the day.  Her husband tells me that she is not eating better.  Will go ahead and increase the metformin dose and start back on the CGM as soon as possible. - I suggested to:  Patient Instructions  Please increase: - Metformin 500 mg 2x a day with meals  Please stop at the lab.  Please return in 4 months.   - we checked her HbA1c: 6.8% (higher) - advised to check sugars at different times of the day - 4x a day, rotating check times - advised for yearly eye exams >> she is due - will check an ACR today - return to clinic in 4 months  2. HL -Reviewed latest lipid panel from 03/2022: LDL above target, otherwise fractions at goal: Lab Results  Component Value Date   CHOL 207 (H) 04/05/2022   HDL 62.70 04/05/2022   LDLCALC 120 (H) 04/05/2022   TRIG 124.0 04/05/2022   CHOLHDL 3 04/05/2022  -She was previously on pravastatin but this was stopped  due to stomach discomfort.  Not on a statin now.  She tried Vascepa but she developed GI symptoms and she stopped it. -She was previously on a statin, pravastatin, but she developed stomach discomfort and stopped.  She tried Vascepa but developed GI symptoms and stopped this medication, also. -Discussed about stopping milk.  She was able to reduce it from 1.5 to 0.5 gallons a week before last visit.  I again advised her to stop completely. -She is due for another lipid panel -will recheck this today  3.  Significant weight loss -Lost a significant amount of weight when she had problems with dentures but also almost 10 pounds before last visit. -We checked a TSH in the past to make sure she was not thyrotoxic.  This was normal. -At last visit, we reduced the dose of metformin although this is weight neutral and should not cause significant weight loss. -Since last visit, she gained 29 pounds!  At today's visit, we will go ahead and increase the metformin dose back  Meagan Pavlov, MD PhD [facility]

## 2023-10-28 NOTE — Telephone Encounter (Signed)
Patient is being seen today 10/28/23

## 2023-10-29 DIAGNOSIS — F411 Generalized anxiety disorder: Secondary | ICD-10-CM | POA: Diagnosis not present

## 2023-10-29 DIAGNOSIS — F4312 Post-traumatic stress disorder, chronic: Secondary | ICD-10-CM | POA: Diagnosis not present

## 2023-10-29 DIAGNOSIS — F332 Major depressive disorder, recurrent severe without psychotic features: Secondary | ICD-10-CM | POA: Diagnosis not present

## 2023-10-29 LAB — LIPID PANEL
Cholesterol: 231 mg/dL — ABNORMAL HIGH (ref <200)
HDL: 54 mg/dL (ref >=50)
LDL Cholesterol (Calc): 140 mg/dL — ABNORMAL HIGH
Non-HDL Cholesterol (Calc): 177 mg/dL — ABNORMAL HIGH (ref <130)
Total CHOL/HDL Ratio: 4.3 (calc) (ref <5.0)
Triglycerides: 231 mg/dL — ABNORMAL HIGH (ref <150)

## 2023-10-29 NOTE — Addendum Note (Signed)
Addended by: Pollie Meyer on: 10/29/2023 09:48 AM   Modules accepted: Orders

## 2023-10-31 ENCOUNTER — Encounter: Payer: Self-pay | Admitting: Internal Medicine

## 2023-10-31 DIAGNOSIS — E782 Mixed hyperlipidemia: Secondary | ICD-10-CM | POA: Diagnosis not present

## 2023-11-05 DIAGNOSIS — F4312 Post-traumatic stress disorder, chronic: Secondary | ICD-10-CM | POA: Diagnosis not present

## 2023-11-05 DIAGNOSIS — F332 Major depressive disorder, recurrent severe without psychotic features: Secondary | ICD-10-CM | POA: Diagnosis not present

## 2023-11-05 DIAGNOSIS — F411 Generalized anxiety disorder: Secondary | ICD-10-CM | POA: Diagnosis not present

## 2023-11-05 DIAGNOSIS — E1165 Type 2 diabetes mellitus with hyperglycemia: Secondary | ICD-10-CM | POA: Diagnosis not present

## 2023-11-12 DIAGNOSIS — F332 Major depressive disorder, recurrent severe without psychotic features: Secondary | ICD-10-CM | POA: Diagnosis not present

## 2023-11-12 DIAGNOSIS — F411 Generalized anxiety disorder: Secondary | ICD-10-CM | POA: Diagnosis not present

## 2023-11-12 DIAGNOSIS — F4312 Post-traumatic stress disorder, chronic: Secondary | ICD-10-CM | POA: Diagnosis not present

## 2023-12-03 DIAGNOSIS — F411 Generalized anxiety disorder: Secondary | ICD-10-CM | POA: Diagnosis not present

## 2023-12-03 DIAGNOSIS — F4312 Post-traumatic stress disorder, chronic: Secondary | ICD-10-CM | POA: Diagnosis not present

## 2023-12-03 DIAGNOSIS — F332 Major depressive disorder, recurrent severe without psychotic features: Secondary | ICD-10-CM | POA: Diagnosis not present

## 2023-12-09 ENCOUNTER — Other Ambulatory Visit: Payer: Self-pay | Admitting: Internal Medicine

## 2023-12-09 DIAGNOSIS — E1165 Type 2 diabetes mellitus with hyperglycemia: Secondary | ICD-10-CM

## 2023-12-10 DIAGNOSIS — F411 Generalized anxiety disorder: Secondary | ICD-10-CM | POA: Diagnosis not present

## 2023-12-10 DIAGNOSIS — F4312 Post-traumatic stress disorder, chronic: Secondary | ICD-10-CM | POA: Diagnosis not present

## 2023-12-10 DIAGNOSIS — F332 Major depressive disorder, recurrent severe without psychotic features: Secondary | ICD-10-CM | POA: Diagnosis not present

## 2023-12-17 DIAGNOSIS — F411 Generalized anxiety disorder: Secondary | ICD-10-CM | POA: Diagnosis not present

## 2023-12-17 DIAGNOSIS — F332 Major depressive disorder, recurrent severe without psychotic features: Secondary | ICD-10-CM | POA: Diagnosis not present

## 2023-12-17 DIAGNOSIS — F4312 Post-traumatic stress disorder, chronic: Secondary | ICD-10-CM | POA: Diagnosis not present

## 2023-12-31 DIAGNOSIS — F411 Generalized anxiety disorder: Secondary | ICD-10-CM | POA: Diagnosis not present

## 2023-12-31 DIAGNOSIS — F4312 Post-traumatic stress disorder, chronic: Secondary | ICD-10-CM | POA: Diagnosis not present

## 2023-12-31 DIAGNOSIS — F332 Major depressive disorder, recurrent severe without psychotic features: Secondary | ICD-10-CM | POA: Diagnosis not present

## 2024-01-07 DIAGNOSIS — F332 Major depressive disorder, recurrent severe without psychotic features: Secondary | ICD-10-CM | POA: Diagnosis not present

## 2024-01-07 DIAGNOSIS — F411 Generalized anxiety disorder: Secondary | ICD-10-CM | POA: Diagnosis not present

## 2024-01-07 DIAGNOSIS — F4312 Post-traumatic stress disorder, chronic: Secondary | ICD-10-CM | POA: Diagnosis not present

## 2024-01-13 DIAGNOSIS — E1165 Type 2 diabetes mellitus with hyperglycemia: Secondary | ICD-10-CM | POA: Diagnosis not present

## 2024-01-13 DIAGNOSIS — I10 Essential (primary) hypertension: Secondary | ICD-10-CM | POA: Diagnosis not present

## 2024-01-13 DIAGNOSIS — F4310 Post-traumatic stress disorder, unspecified: Secondary | ICD-10-CM | POA: Diagnosis not present

## 2024-01-13 DIAGNOSIS — F419 Anxiety disorder, unspecified: Secondary | ICD-10-CM | POA: Diagnosis not present

## 2024-01-13 DIAGNOSIS — F332 Major depressive disorder, recurrent severe without psychotic features: Secondary | ICD-10-CM | POA: Diagnosis not present

## 2024-01-13 DIAGNOSIS — E782 Mixed hyperlipidemia: Secondary | ICD-10-CM | POA: Diagnosis not present

## 2024-01-28 DIAGNOSIS — F4312 Post-traumatic stress disorder, chronic: Secondary | ICD-10-CM | POA: Diagnosis not present

## 2024-01-28 DIAGNOSIS — F411 Generalized anxiety disorder: Secondary | ICD-10-CM | POA: Diagnosis not present

## 2024-01-28 DIAGNOSIS — F332 Major depressive disorder, recurrent severe without psychotic features: Secondary | ICD-10-CM | POA: Diagnosis not present

## 2024-01-29 DIAGNOSIS — F489 Nonpsychotic mental disorder, unspecified: Secondary | ICD-10-CM | POA: Diagnosis not present

## 2024-01-29 DIAGNOSIS — F411 Generalized anxiety disorder: Secondary | ICD-10-CM | POA: Diagnosis not present

## 2024-01-29 DIAGNOSIS — F332 Major depressive disorder, recurrent severe without psychotic features: Secondary | ICD-10-CM | POA: Diagnosis not present

## 2024-01-29 DIAGNOSIS — F4310 Post-traumatic stress disorder, unspecified: Secondary | ICD-10-CM | POA: Diagnosis not present

## 2024-01-29 DIAGNOSIS — I10 Essential (primary) hypertension: Secondary | ICD-10-CM | POA: Diagnosis not present

## 2024-02-01 ENCOUNTER — Encounter (INDEPENDENT_AMBULATORY_CARE_PROVIDER_SITE_OTHER): Payer: Self-pay | Admitting: Internal Medicine

## 2024-02-01 DIAGNOSIS — E1165 Type 2 diabetes mellitus with hyperglycemia: Secondary | ICD-10-CM | POA: Diagnosis not present

## 2024-02-01 DIAGNOSIS — Z794 Long term (current) use of insulin: Secondary | ICD-10-CM | POA: Diagnosis not present

## 2024-02-03 ENCOUNTER — Encounter: Payer: Self-pay | Admitting: Endocrinology

## 2024-02-03 DIAGNOSIS — E1165 Type 2 diabetes mellitus with hyperglycemia: Secondary | ICD-10-CM | POA: Diagnosis not present

## 2024-02-03 MED ORDER — METFORMIN HCL 500 MG PO TABS: 500.0000 mg | ORAL_TABLET | Freq: Three times a day (TID) | ORAL | 3 refills | Status: DC

## 2024-02-03 NOTE — Telephone Encounter (Signed)
 Pt husband came in and brought [place] the meter, we downloaded it. Can you please review in Dr. Roosevelt Locks absence.

## 2024-02-03 NOTE — Addendum Note (Signed)
 Addended by: Allexis Bordenave, [place] on: 02/03/2024 05:12 PM   Modules accepted: Orders

## 2024-02-03 NOTE — Telephone Encounter (Signed)
 Reviewing CGM data in the absence of Dr. Elvera Lennox.  Patient is a 68 year old female with type 2 diabetes mellitus currently on metformin.  Hemoglobin A1c was 6.8% in December 2024.  CONTINUOUS GLUCOSE MONITORING SYSTEM (CGMS) INTERPRETATION:            FreeStyle Libre 3+ CGM-  Sensor Download (Sensor download was reviewed and summarized below.) Dates: February 26 to February 03, 2024, 14 days Sensor Average: 160  Glucose Management Indicator: 7.1% Glucose Variability: 17.8%  % data captured: 78%  Glycemic Trends:  <54: 0% 54-70: 0% 71-180: 78% 181-250: 21% 251-400: 1%  Impression: -Sensor data from February 28 to March 11 reviewed.  She has random hyperglycemia with blood sugar up to 250 -290 range generally with afternoon related to lunch rare hyperglycemia with supper.  Rare mild hyperglycemia with breakfast as well.  Blood sugar overnight and in between the meals are mostly acceptable.  Recommendations: -She is currently taking metformin 500 mg 2 times a day with meals. -I would recommend to increase metformin 500 mg with meals 3 times a day with his meals breakfast, lunch and supper. -Decrease amount of carbohydrates in the meals and also portion control of the meal to avoid hyperglycemia /elevated blood sugar.  [place] Lonald Troiani, MD [facility] [address] [place], [address] [place], [place] [address] Phone # [phone]

## 2024-02-04 DIAGNOSIS — F411 Generalized anxiety disorder: Secondary | ICD-10-CM | POA: Diagnosis not present

## 2024-02-04 DIAGNOSIS — F332 Major depressive disorder, recurrent severe without psychotic features: Secondary | ICD-10-CM | POA: Diagnosis not present

## 2024-02-04 DIAGNOSIS — F4312 Post-traumatic stress disorder, chronic: Secondary | ICD-10-CM | POA: Diagnosis not present

## 2024-02-11 DIAGNOSIS — F411 Generalized anxiety disorder: Secondary | ICD-10-CM | POA: Diagnosis not present

## 2024-02-11 DIAGNOSIS — F332 Major depressive disorder, recurrent severe without psychotic features: Secondary | ICD-10-CM | POA: Diagnosis not present

## 2024-02-11 DIAGNOSIS — F4312 Post-traumatic stress disorder, chronic: Secondary | ICD-10-CM | POA: Diagnosis not present

## 2024-02-12 DIAGNOSIS — H25813 Combined forms of age-related cataract, bilateral: Secondary | ICD-10-CM | POA: Diagnosis not present

## 2024-02-12 DIAGNOSIS — E119 Type 2 diabetes mellitus without complications: Secondary | ICD-10-CM | POA: Diagnosis not present

## 2024-02-12 DIAGNOSIS — H43813 Vitreous degeneration, bilateral: Secondary | ICD-10-CM | POA: Diagnosis not present

## 2024-02-12 LAB — HM DIABETES EYE EXAM

## 2024-02-18 DIAGNOSIS — F332 Major depressive disorder, recurrent severe without psychotic features: Secondary | ICD-10-CM | POA: Diagnosis not present

## 2024-02-18 DIAGNOSIS — F4312 Post-traumatic stress disorder, chronic: Secondary | ICD-10-CM | POA: Diagnosis not present

## 2024-02-18 DIAGNOSIS — F411 Generalized anxiety disorder: Secondary | ICD-10-CM | POA: Diagnosis not present

## 2024-03-03 DIAGNOSIS — F4312 Post-traumatic stress disorder, chronic: Secondary | ICD-10-CM | POA: Diagnosis not present

## 2024-03-03 DIAGNOSIS — F411 Generalized anxiety disorder: Secondary | ICD-10-CM | POA: Diagnosis not present

## 2024-03-03 DIAGNOSIS — F332 Major depressive disorder, recurrent severe without psychotic features: Secondary | ICD-10-CM | POA: Diagnosis not present

## 2024-03-10 DIAGNOSIS — F411 Generalized anxiety disorder: Secondary | ICD-10-CM | POA: Diagnosis not present

## 2024-03-10 DIAGNOSIS — F332 Major depressive disorder, recurrent severe without psychotic features: Secondary | ICD-10-CM | POA: Diagnosis not present

## 2024-03-10 DIAGNOSIS — F4312 Post-traumatic stress disorder, chronic: Secondary | ICD-10-CM | POA: Diagnosis not present

## 2024-03-11 ENCOUNTER — Ambulatory Visit: Payer: Medicare PPO | Admitting: Internal Medicine

## 2024-03-24 DIAGNOSIS — F4312 Post-traumatic stress disorder, chronic: Secondary | ICD-10-CM | POA: Diagnosis not present

## 2024-03-24 DIAGNOSIS — F332 Major depressive disorder, recurrent severe without psychotic features: Secondary | ICD-10-CM | POA: Diagnosis not present

## 2024-03-24 DIAGNOSIS — F411 Generalized anxiety disorder: Secondary | ICD-10-CM | POA: Diagnosis not present

## 2024-03-31 DIAGNOSIS — F411 Generalized anxiety disorder: Secondary | ICD-10-CM | POA: Diagnosis not present

## 2024-03-31 DIAGNOSIS — F4312 Post-traumatic stress disorder, chronic: Secondary | ICD-10-CM | POA: Diagnosis not present

## 2024-03-31 DIAGNOSIS — F332 Major depressive disorder, recurrent severe without psychotic features: Secondary | ICD-10-CM | POA: Diagnosis not present

## 2024-04-02 ENCOUNTER — Encounter: Payer: Self-pay | Admitting: Internal Medicine

## 2024-04-02 ENCOUNTER — Ambulatory Visit: Admitting: Internal Medicine

## 2024-04-02 VITALS — BP 130/80 | HR 68 | Ht 72.0 in | Wt 178.0 lb

## 2024-04-02 DIAGNOSIS — R634 Abnormal weight loss: Secondary | ICD-10-CM

## 2024-04-02 DIAGNOSIS — E782 Mixed hyperlipidemia: Secondary | ICD-10-CM

## 2024-04-02 DIAGNOSIS — E1165 Type 2 diabetes mellitus with hyperglycemia: Secondary | ICD-10-CM

## 2024-04-02 DIAGNOSIS — Z794 Long term (current) use of insulin: Secondary | ICD-10-CM

## 2024-04-02 LAB — POCT GLYCOSYLATED HEMOGLOBIN (HGB A1C): Hemoglobin A1C: 7.1 % — AB (ref 4.0–5.6)

## 2024-04-02 MED ORDER — METFORMIN HCL 500 MG PO TABS: 500.0000 mg | ORAL_TABLET | Freq: Four times a day (QID) | ORAL | 3 refills | Status: AC

## 2024-04-02 NOTE — Patient Instructions (Addendum)
 Please increase: - Metformin  500 mg 4x a day with meals  Please return in 4 months.

## 2024-04-02 NOTE — Progress Notes (Signed)
 Patient ID: Loni M Staffa, female   DOB: 13-Apr-1956, 68 y.o.   MRN: [id number]   HPI: SABRA PLANTZ is a 68 y.o.-year-old female, initially referred by her PCP, Dr. Faustina Hood, returning for follow-up for DM2, dx in 2005, prev. insulin-dependent, uncontrolled, without long-term complications. Last visit 9 months ago.  She is here with her husband who offers most of the history as patient is mostly nonverbal.  Interim history: She previously lost a significant amount of weight previously, before getting dentures.  Before last visit, she gained almost 30 pounds back.  Since last visit, she gained another 9 pounds back.  She is now eating more, and not necessarily very healthy, per husband.   No blurry vision, nausea, chest pain, swelling.  She has urinary incontinence. She has severe PTSD with significant anxiety >> she was previously on on Ketamine infusion, Xanax , Geodon, Paroxetine, Abilify.  We usually do not have her exact list of medications since they are not in the system and they do not usually bring the medications with them.  Reviewed history: In 2020, husband was telling me that they had to take her insulin away and lock it as she was using too much insulin, even when sugars were only slightly above normal. Her sugars did improve after this measure.  Reviewed HbA1c levels: Lab Results  Component Value Date   HGBA1C 6.8 (A) 10/28/2023   HGBA1C 5.8 (A) 01/21/2023   HGBA1C 6.4 (A) 07/19/2022   HGBA1C 6.7 (A) 04/05/2022   HGBA1C 6.6 (A) 08/22/2021   HGBA1C 6.4 (A) 02/19/2021   HGBA1C 6.4 (A) 08/22/2020   HGBA1C 7.1 (A) 04/20/2020   HGBA1C 6.2 (A) 09/23/2019  06/09/2023: HbA1c 6.5% 07/01/2019: HbA1c 6.9% 09/16/2018: HbA1c 7.1%  She is on: - Metformin  500 mg 2x a day, with meals >> 500 mg with breakfast >> 500 mg 2x a day with meals (lunch and dinner) >> 500 mg 1 >> 2 >> 3 x a day Prev. NovoLog as needed 6 units for very high blood sugars now.  She was previously on: - Tresiba  90 +/-20 units at bedtime >> stopped 05/2019. - Novolog ~10 units 3x a day, before meals  - Jardiance >> yeast inf's.  She checks her sugars more than 4 times a day with her freestyle libre CGM ([employer], with receiver):   - am: 130-150 - 2h after b'fast: ? - lunch: 140-150 - 2h after lunch: 69-201, 220 - dinner: >> ? - 2h after dinner: ave: 180 - bedtime: ?  Prev.:   Lowest sugar was 30 >> ... 55 >> 69 >> 100; she has hypoglycemia awareness at 100. Highest sugar was 298 >> ...  239 >> 200 >> 300 (before increasing Metformin ).  Glucometer: Freestyle Libre 2, One Touch  Pt's meals are:  - Breakfast: oatmeal >> pancakes - Lunch: cold cut meats + cheese puffs, 3 musketeers bar - Dinner: Different - Snacks: chocolate, juice; also snacks at night Tries to walk for exercise daily.  -No CKD, last BUN/creatinine:    Lab Results  Component Value Date   BUN 9 01/31/2020   CREATININE 0.65 01/31/2020  No results found for: "MICRALBCREAT" On Losartan.  -+ HL L; last set of lipids: Lab Results  Component Value Date   CHOL 231 (H) 10/28/2023   HDL 54 10/28/2023   LDLCALC 140 (H) 10/28/2023   TRIG 231 (H) 10/28/2023   CHOLHDL 4.3 10/28/2023  09/21/2020: 175/153/51/97 06/24/2019: 207/130/43/138 09/16/2018: 267/404/38/190 Not on pravastatin 20 b/c upset  stomach.  Previously on Vascepa - stopped as she had GI sxs from it.  At last visit, I suggested another statin, but she did not start this. He started Ezetimibe 10 mg daily.  - last eye exam was 02/2024: no DR reportedly.   - no  numbness and tingling in her feet.  On her exam from 06/2019: Callused feet, onychomycosis, long nails; but with normal circulation and response to monofilament. Foot exam was done by PCP 10/28/2023. She continues on Cymbalta .  She started Neurontin -for mental health.  Latest TSH is normal: Lab Results  Component Value Date   TSH 0.51 07/19/2022  08/21/2021: TSH 1.73 09/21/2020: TSH  0.66 06/24/2019: TSH 1.06  Per neurology request, we checked her metanephrine/catecholamine levels at last visit-normal: Component     Latest Ref Rng 07/19/2022  Metanephrine, Pl     <=57 pg/mL <25   Normetanephrine, Pl     <=148 pg/mL 141   Total Metanephrines-Plasma     <=205 pg/mL 141   Epinephrine     pg/mL 37   Norepinephrine     pg/mL 585   Dopamine     pg/mL <10   Total Catecholamines     pg/mL 622    Pt has FH of DM in M, [place], M aunt, M cousins.  ROS: + See HPI  I reviewed pt's medications, allergies, PMH, social hx, family hx, and changes were documented in the history of present illness. Otherwise, unchanged from my initial visit note.  Past Medical History:  Diagnosis Date   Back pain    Diabetes mellitus, type II (HCC)    Heart murmur    Sleep apnea    Past Surgical History:  Procedure Laterality Date   CESAREAN SECTION     Social History   Socioeconomic History   Marital status: Married    Spouse name: Not on file   Number of children: 2   Years of education: Not on file   Highest education level: Not on file  Occupational History    Early retirement  Social Needs   Financial resource strain: Not on file   Food insecurity    Worry: Not on file    Inability: Not on file   Transportation needs    Medical: Not on file    Non-medical: Not on file  Tobacco Use   Smoking status: Current Every Day Smoker   Smokeless tobacco: Never Used  Substance and Sexual Activity   Alcohol use:  Once a year   Drug use: No   Current Outpatient Medications on File Prior to Visit  Medication Sig Dispense Refill   Acetylcysteine (NAC 600 PO) Take by mouth.     amLODipine (NORVASC) 5 MG tablet TAKE 1 TABLET EVERY DAY AT LUNCH     Continuous Blood Gluc Sensor (FREESTYLE LIBRE 3 SENSOR) MISC 1 each by Does not apply route every 14 (fourteen) days. 6 each 3   hydrochlorothiazide (MICROZIDE) 12.5 MG capsule Take 12.5 mg by mouth daily as needed. (Patient not taking:  Reported on 01/21/2023)     losartan (COZAAR) 100 MG tablet Take 100 mg by mouth daily.     MELATONIN PO Take 20 mg by mouth. Taking 1 tab in the evening and 1 tab before bedtime.     metFORMIN  (GLUCOPHAGE ) 500 MG tablet Take 1 tablet (500 mg total) by mouth with breakfast, with lunch, and with evening meal. 270 tablet 3   Multiple Vitamins-Minerals (ZINC PO) Take by mouth.  Probiotic Product (PROBIOTIC PO) Take by mouth.     VITAMIN D PO Take by mouth.     ziprasidone (GEODON) 20 MG capsule Take by mouth. (Patient not taking: Reported on 01/21/2023)     No current facility-administered medications on file prior to visit.   Allergies  Allergen Reactions   Atorvastatin Nausea And Vomiting   Empagliflozin Other (See Comments)   Nsaids    Penicillin G Hives   Pravastatin Nausea And Vomiting   Rosuvastatin Calcium     Other Reaction(s): upset stomach   Sulfa Antibiotics    Family History  Problem Relation Age of Onset   Anxiety disorder Mother    Anxiety disorder Maternal Grandmother    Anxiety disorder Daughter   She also has a history of HL and lung cancer in father; heart disease in mother.  PE: BP 130/80   Pulse 68   Ht 6' (1.829 m)   Wt 178 lb (80.7 kg)   SpO2 97%   BMI 24.14 kg/m  Wt Readings from Last 3 Encounters:  04/02/24 178 lb (80.7 kg)  10/28/23 169 lb 3.2 oz (76.7 kg)  01/21/23 140 lb 12.8 oz (63.9 kg)   Constitutional: Normal weight, in NAD Eyes: no exophthalmos ENT: no masses palpated in neck, no cervical lymphadenopathy Cardiovascular: RRR, No MRG Respiratory: CTA B Musculoskeletal: no deformities Skin: no rashes Neurological: no tremor with outstretched hands  ASSESSMENT: 1. DM2, insulin-dependent, uncontrolled, without long-term complications, but with hypo and hyperglycemia In 2020, I received a message from Maretta Shaper, PhD, her counselor, letting me know about the patient's anxiety/depression and the fact that she may overcorrect low blood  sugars or eat a lot of sweets to try to prevent blood sugar crashes.  2. HL  3.  Significant weight loss  PLAN:  1. Patient with longstanding, type 2 diabetes, on metformin  only.  In the past, she was on insulin but she was not eating much and lost a significant amount of weight and insulin was stopped due to overuse.  HbA1c decreased to 5.8%, but at last visit, this was higher, at 6.8%, after she started to eat better.  She also had a significant weight gain.  At last visit, she was off the CGM due to lack of supplies but she was able to start this back.  She came for a CGM download 3 months ago and my colleague adjusted her metformin  up from 2 to 3 tablets a day with meals. CGM interpretation: -At today's visit, we reviewed her CGM downloads: It appears that 81% of values are in target range (goal >70%), while 19% are higher than 180 (goal <25%), and 0% are lower than 70 (goal <4%).  The calculated average blood sugar is 156.  The projected HbA1c for the next 3 months (GMI) is 7.0%. -Reviewing the CGM trends, sugars are fluctuating in the upper half of the target range with hyperglycemic excursions after her midday and evening meals.  We will need to try to avoid medications with weight loss or loss of appetite as a side effect for her due to previously losing too much weight.  For now, I suggested to try to increase metformin  with dinner to 2 tablets and continue 1 tablet with breakfast and lunch.  Husband agrees with this plan.  At next visit, we may need to intensify the regimen.  We discussed about trying to improve diet but this is difficult due to the patient's psychiatric condition. - I suggested to:  Patient Instructions  Please increase: - Metformin  500 mg 4x a day with meals  Please return in 4 months.   - we checked her HbA1c: 7.1% (higher) - advised to check sugars at different times of the day - 4x a day, rotating check times - advised for yearly eye exams >> she is UTD - she  needs an ACR -could not urinate at last visit.  She will try now. - return to clinic in 3-4 months  2. HL -Reviewed latest lipid panel from 03/2022: LDL above target, otherwise fractions at goal: Lab Results  Component Value Date   CHOL 231 (H) 10/28/2023   HDL 54 10/28/2023   LDLCALC 140 (H) 10/28/2023   TRIG 231 (H) 10/28/2023   CHOLHDL 4.3 10/28/2023  -She was previously on a statin, pravastatin, but she developed stomach discomfort and stopped.  She tried Vascepa but developed GI symptoms and stopped this medication, also. - She was previously drinking a lot of milk, reduced from 1.5 to 0.5 gallons a week but I did advise her to stop completely - At last visit I recommended Lipitor 20 mg daily.  Husband did not start her on this due to the fact that he read that this can have a risk of dementia.  However, she started ezetimibe 10 mg since last visit.  3.  Significant weight loss - Lost a significant amount of weight when she had problems with dentures  - a TSH was normal - Before last visit, however, she gained 29 pounds  Emilie Harden, MD PhD [facility] Endocrinology

## 2024-04-06 ENCOUNTER — Other Ambulatory Visit

## 2024-04-06 DIAGNOSIS — E1165 Type 2 diabetes mellitus with hyperglycemia: Secondary | ICD-10-CM | POA: Diagnosis not present

## 2024-04-06 DIAGNOSIS — Z794 Long term (current) use of insulin: Secondary | ICD-10-CM | POA: Diagnosis not present

## 2024-04-07 ENCOUNTER — Ambulatory Visit: Payer: Self-pay | Admitting: Internal Medicine

## 2024-04-07 DIAGNOSIS — F4312 Post-traumatic stress disorder, chronic: Secondary | ICD-10-CM | POA: Diagnosis not present

## 2024-04-07 DIAGNOSIS — F411 Generalized anxiety disorder: Secondary | ICD-10-CM | POA: Diagnosis not present

## 2024-04-07 DIAGNOSIS — F332 Major depressive disorder, recurrent severe without psychotic features: Secondary | ICD-10-CM | POA: Diagnosis not present

## 2024-04-07 LAB — MICROALBUMIN / CREATININE URINE RATIO
Creatinine, Urine: 138 mg/dL (ref 20–275)
Microalb Creat Ratio: 9 mg/g{creat} (ref <30)
Microalb, Ur: 1.3 mg/dL

## 2024-04-21 DIAGNOSIS — F332 Major depressive disorder, recurrent severe without psychotic features: Secondary | ICD-10-CM | POA: Diagnosis not present

## 2024-04-21 DIAGNOSIS — F411 Generalized anxiety disorder: Secondary | ICD-10-CM | POA: Diagnosis not present

## 2024-04-21 DIAGNOSIS — F4312 Post-traumatic stress disorder, chronic: Secondary | ICD-10-CM | POA: Diagnosis not present

## 2024-04-28 DIAGNOSIS — F4312 Post-traumatic stress disorder, chronic: Secondary | ICD-10-CM | POA: Diagnosis not present

## 2024-04-28 DIAGNOSIS — F411 Generalized anxiety disorder: Secondary | ICD-10-CM | POA: Diagnosis not present

## 2024-04-28 DIAGNOSIS — F332 Major depressive disorder, recurrent severe without psychotic features: Secondary | ICD-10-CM | POA: Diagnosis not present

## 2024-05-03 DIAGNOSIS — E1165 Type 2 diabetes mellitus with hyperglycemia: Secondary | ICD-10-CM | POA: Diagnosis not present

## 2024-05-04 DIAGNOSIS — F411 Generalized anxiety disorder: Secondary | ICD-10-CM | POA: Diagnosis not present

## 2024-05-04 DIAGNOSIS — F4310 Post-traumatic stress disorder, unspecified: Secondary | ICD-10-CM | POA: Diagnosis not present

## 2024-05-04 DIAGNOSIS — F332 Major depressive disorder, recurrent severe without psychotic features: Secondary | ICD-10-CM | POA: Diagnosis not present

## 2024-05-04 DIAGNOSIS — F489 Nonpsychotic mental disorder, unspecified: Secondary | ICD-10-CM | POA: Diagnosis not present

## 2024-05-04 DIAGNOSIS — I10 Essential (primary) hypertension: Secondary | ICD-10-CM | POA: Diagnosis not present

## 2024-05-05 DIAGNOSIS — F411 Generalized anxiety disorder: Secondary | ICD-10-CM | POA: Diagnosis not present

## 2024-05-05 DIAGNOSIS — F332 Major depressive disorder, recurrent severe without psychotic features: Secondary | ICD-10-CM | POA: Diagnosis not present

## 2024-05-05 DIAGNOSIS — F4312 Post-traumatic stress disorder, chronic: Secondary | ICD-10-CM | POA: Diagnosis not present

## 2024-05-12 DIAGNOSIS — F332 Major depressive disorder, recurrent severe without psychotic features: Secondary | ICD-10-CM | POA: Diagnosis not present

## 2024-05-12 DIAGNOSIS — F4312 Post-traumatic stress disorder, chronic: Secondary | ICD-10-CM | POA: Diagnosis not present

## 2024-05-12 DIAGNOSIS — F411 Generalized anxiety disorder: Secondary | ICD-10-CM | POA: Diagnosis not present

## 2024-05-19 DIAGNOSIS — F411 Generalized anxiety disorder: Secondary | ICD-10-CM | POA: Diagnosis not present

## 2024-05-19 DIAGNOSIS — F4312 Post-traumatic stress disorder, chronic: Secondary | ICD-10-CM | POA: Diagnosis not present

## 2024-05-19 DIAGNOSIS — F332 Major depressive disorder, recurrent severe without psychotic features: Secondary | ICD-10-CM | POA: Diagnosis not present

## 2024-05-26 DIAGNOSIS — F411 Generalized anxiety disorder: Secondary | ICD-10-CM | POA: Diagnosis not present

## 2024-05-26 DIAGNOSIS — F4312 Post-traumatic stress disorder, chronic: Secondary | ICD-10-CM | POA: Diagnosis not present

## 2024-05-26 DIAGNOSIS — F332 Major depressive disorder, recurrent severe without psychotic features: Secondary | ICD-10-CM | POA: Diagnosis not present

## 2024-06-02 DIAGNOSIS — F4312 Post-traumatic stress disorder, chronic: Secondary | ICD-10-CM | POA: Diagnosis not present

## 2024-06-02 DIAGNOSIS — F411 Generalized anxiety disorder: Secondary | ICD-10-CM | POA: Diagnosis not present

## 2024-06-02 DIAGNOSIS — F332 Major depressive disorder, recurrent severe without psychotic features: Secondary | ICD-10-CM | POA: Diagnosis not present

## 2024-06-16 DIAGNOSIS — F332 Major depressive disorder, recurrent severe without psychotic features: Secondary | ICD-10-CM | POA: Diagnosis not present

## 2024-06-16 DIAGNOSIS — F4312 Post-traumatic stress disorder, chronic: Secondary | ICD-10-CM | POA: Diagnosis not present

## 2024-06-16 DIAGNOSIS — F411 Generalized anxiety disorder: Secondary | ICD-10-CM | POA: Diagnosis not present

## 2024-06-30 DIAGNOSIS — F411 Generalized anxiety disorder: Secondary | ICD-10-CM | POA: Diagnosis not present

## 2024-06-30 DIAGNOSIS — F332 Major depressive disorder, recurrent severe without psychotic features: Secondary | ICD-10-CM | POA: Diagnosis not present

## 2024-06-30 DIAGNOSIS — F4312 Post-traumatic stress disorder, chronic: Secondary | ICD-10-CM | POA: Diagnosis not present

## 2024-07-06 DIAGNOSIS — E782 Mixed hyperlipidemia: Secondary | ICD-10-CM | POA: Diagnosis not present

## 2024-07-06 DIAGNOSIS — Z0000 Encounter for general adult medical examination without abnormal findings: Secondary | ICD-10-CM | POA: Diagnosis not present

## 2024-07-06 DIAGNOSIS — F489 Nonpsychotic mental disorder, unspecified: Secondary | ICD-10-CM | POA: Diagnosis not present

## 2024-07-06 DIAGNOSIS — I10 Essential (primary) hypertension: Secondary | ICD-10-CM | POA: Diagnosis not present

## 2024-07-06 DIAGNOSIS — F419 Anxiety disorder, unspecified: Secondary | ICD-10-CM | POA: Diagnosis not present

## 2024-07-06 DIAGNOSIS — F4310 Post-traumatic stress disorder, unspecified: Secondary | ICD-10-CM | POA: Diagnosis not present

## 2024-07-06 DIAGNOSIS — E1165 Type 2 diabetes mellitus with hyperglycemia: Secondary | ICD-10-CM | POA: Diagnosis not present

## 2024-07-06 DIAGNOSIS — F332 Major depressive disorder, recurrent severe without psychotic features: Secondary | ICD-10-CM | POA: Diagnosis not present

## 2024-07-07 DIAGNOSIS — F4312 Post-traumatic stress disorder, chronic: Secondary | ICD-10-CM | POA: Diagnosis not present

## 2024-07-07 DIAGNOSIS — F411 Generalized anxiety disorder: Secondary | ICD-10-CM | POA: Diagnosis not present

## 2024-07-07 DIAGNOSIS — F332 Major depressive disorder, recurrent severe without psychotic features: Secondary | ICD-10-CM | POA: Diagnosis not present

## 2024-07-14 DIAGNOSIS — F4312 Post-traumatic stress disorder, chronic: Secondary | ICD-10-CM | POA: Diagnosis not present

## 2024-07-14 DIAGNOSIS — F332 Major depressive disorder, recurrent severe without psychotic features: Secondary | ICD-10-CM | POA: Diagnosis not present

## 2024-07-14 DIAGNOSIS — F411 Generalized anxiety disorder: Secondary | ICD-10-CM | POA: Diagnosis not present

## 2024-07-28 DIAGNOSIS — F411 Generalized anxiety disorder: Secondary | ICD-10-CM | POA: Diagnosis not present

## 2024-07-28 DIAGNOSIS — F332 Major depressive disorder, recurrent severe without psychotic features: Secondary | ICD-10-CM | POA: Diagnosis not present

## 2024-07-28 DIAGNOSIS — F4312 Post-traumatic stress disorder, chronic: Secondary | ICD-10-CM | POA: Diagnosis not present

## 2024-08-04 DIAGNOSIS — F332 Major depressive disorder, recurrent severe without psychotic features: Secondary | ICD-10-CM | POA: Diagnosis not present

## 2024-08-04 DIAGNOSIS — F411 Generalized anxiety disorder: Secondary | ICD-10-CM | POA: Diagnosis not present

## 2024-08-04 DIAGNOSIS — F4312 Post-traumatic stress disorder, chronic: Secondary | ICD-10-CM | POA: Diagnosis not present

## 2024-08-07 DIAGNOSIS — E1165 Type 2 diabetes mellitus with hyperglycemia: Secondary | ICD-10-CM | POA: Diagnosis not present

## 2024-08-11 DIAGNOSIS — F411 Generalized anxiety disorder: Secondary | ICD-10-CM | POA: Diagnosis not present

## 2024-08-11 DIAGNOSIS — F4312 Post-traumatic stress disorder, chronic: Secondary | ICD-10-CM | POA: Diagnosis not present

## 2024-08-11 DIAGNOSIS — F332 Major depressive disorder, recurrent severe without psychotic features: Secondary | ICD-10-CM | POA: Diagnosis not present

## 2024-08-16 ENCOUNTER — Encounter: Payer: Self-pay | Admitting: Internal Medicine

## 2024-08-16 ENCOUNTER — Ambulatory Visit: Admitting: Internal Medicine

## 2024-08-16 VITALS — BP 120/60 | HR 81 | Ht 72.0 in | Wt 185.0 lb

## 2024-08-16 DIAGNOSIS — E1165 Type 2 diabetes mellitus with hyperglycemia: Secondary | ICD-10-CM

## 2024-08-16 DIAGNOSIS — Z794 Long term (current) use of insulin: Secondary | ICD-10-CM | POA: Diagnosis not present

## 2024-08-16 DIAGNOSIS — E782 Mixed hyperlipidemia: Secondary | ICD-10-CM

## 2024-08-16 LAB — POCT GLYCOSYLATED HEMOGLOBIN (HGB A1C): Hemoglobin A1C: 6.6 % — AB (ref 4.0–5.6)

## 2024-08-16 NOTE — Progress Notes (Signed)
 Patient ID: Meagan Moran, female   DOB: [birth year], 68 y.o.   MRN: [id number]   HPI: Meagan Moran is a 68 y.o.-year-old female, initially referred by her PCP, Dr. Alberta Sharps, returning for follow-up for DM2, dx in 2005, prev. insulin-dependent, uncontrolled, without long-term complications. Last visit 4.5 months ago.  She is here with her husband who offers most of the history as patient is mostly nonverbal.  Interim history: Patient previously had problems losing a significant amount of weight, almost 30 pounds as she could not eat well while getting dentures.  However, afterwards, she started to eat more, gained weight, and the sugars worsened. No blurry vision, nausea, chest pain, swelling.  She has urinary incontinence. She has severe PTSD with significant anxiety >> she was previously on on Ketamine infusion, Xanax , Geodon, Paroxetine, Abilify.  I did not have the exact list of her medications.  Patient's husband mentions that she also started hypnosis, which is helping more than anything previously. Se eats out 3-4x a week sometimes brings food home.   Reviewed history: In 2020, husband was telling me that they had to take her insulin away and lock it as she was using too much insulin, even when sugars were only slightly above normal. Her sugars did improve after this measure.  Reviewed HbA1c levels: 07/06/2024: HbA1c 6.4% Lab Results  Component Value Date   HGBA1C 7.1 (A) 04/02/2024   HGBA1C 6.8 (A) 10/28/2023   HGBA1C 5.8 (A) 01/21/2023   HGBA1C 6.4 (A) 07/19/2022   HGBA1C 6.7 (A) 04/05/2022   HGBA1C 6.6 (A) 08/22/2021   HGBA1C 6.4 (A) 02/19/2021   HGBA1C 6.4 (A) 08/22/2020   HGBA1C 7.1 (A) 04/20/2020   HGBA1C 6.2 (A) 09/23/2019  06/09/2023: HbA1c 6.5% 07/01/2019: HbA1c 6.9% 09/16/2018: HbA1c 7.1%  She is on: - Metformin  500 mg 2x a day, with meals ... >> 500 mg 1 >> 2 >> 3 >> 4x a day Prev. NovoLog as needed 6 units for very high blood sugars now.  She was previously  on: - Tresiba 90 +/-20 units at bedtime >> stopped 05/2019. - Novolog ~10 units 3x a day, before meals  - Jardiance >> yeast inf's.  She checks her sugars more than 4 times a day with her freestyle libre CGM ([employer], with receiver):  Prev.:   - am: 130-150 - 2h after b'fast: ? - lunch: 140-150 - 2h after lunch: 69-201, 220 - dinner: >> ? - 2h after dinner: ave: 180 - bedtime: ?  Prev.:   Lowest sugar was 30 >> ... 55 >> 69 >> 100 >> 68; she has hypoglycemia awareness at 100. Highest sugar was 298 >> ...  200 >> 300 (before increasing Metformin ) >> 239.  Glucometer: Freestyle Libre 2, One Touch  Pt's meals are:  - Breakfast: oatmeal >> pancakes - Lunch: cold cut meats + cheese puffs, 3 musketeers bar - Dinner: Different - Snacks: chocolate, juice; also snacks at night Tries to walk for exercise daily.  -No CKD, last BUN/creatinine:     Lab Results  Component Value Date   BUN 9 01/31/2020   CREATININE 0.65 01/31/2020   Lab Results  Component Value Date   MICRALBCREAT 9 04/06/2024  On Losartan.  -+ HL L; last set of lipids:  Lab Results  Component Value Date   CHOL 231 (H) 10/28/2023   HDL 54 10/28/2023   LDLCALC 140 (H) 10/28/2023   TRIG 231 (H) 10/28/2023   CHOLHDL 4.3 10/28/2023  09/21/2020: 175/153/51/97 06/24/2019: 207/130/43/138  09/16/2018: 267/404/38/190 Not on statin b/c upset stomach.  Previously on Vascepa - stopped as she had GI sxs from it.  At last visit, I suggested another statin, but she did not start this. He started Ezetimibe 10 mg daily. On Fish oil 1000-1500 mg daily  - last eye exam was 02/2024: no DR reportedly.   - no  numbness and tingling in her feet.  On her exam from 06/2019: Callused feet, onychomycosis, long nails; but with normal circulation and response to monofilament. Foot exam was done by PCP 01/13/2024: Comments: Diabetic foot exam:  Left: Monofilament test: Sensation normal Pulses: normal and present Skin: Normal  and no erythema, no cyanosis or pallor  Other findings: thick toenail (big toe) Right: Monofilament test: Sensation normal Pulses: normal and present Skin: Normal and no erythema, no cyanosis or pallor  Other findings: thick toenail (big toe) Exam performed with shoes and socks removed.  She continues on Cymbalta .  She started Neurontin -for mental health.  Latest TSH is normal: Lab Results  Component Value Date   TSH 0.51 07/19/2022  08/21/2021: TSH 1.73 09/21/2020: TSH 0.66 06/24/2019: TSH 1.06  Per neurology request, we checked her metanephrine/catecholamine levels at last visit-normal: Component     Latest Ref Rng 07/19/2022  Metanephrine, Pl     <=57 pg/mL <25   Normetanephrine, Pl     <=148 pg/mL 141   Total Metanephrines-Plasma     <=205 pg/mL 141   Epinephrine     pg/mL 37   Norepinephrine     pg/mL 585   Dopamine     pg/mL <10   Total Catecholamines     pg/mL 622    Pt has FH of DM in M, [place], M aunt, M cousins.  ROS: + See HPI  I reviewed pt's medications, allergies, PMH, social hx, family hx, and changes were documented in the history of present illness. Otherwise, unchanged from my initial visit note.  Past Medical History:  Diagnosis Date   Back pain    Diabetes mellitus, type II (HCC)    Heart murmur    Sleep apnea    Past Surgical History:  Procedure Laterality Date   CESAREAN SECTION     Social History   Socioeconomic History   Marital status: Married    Spouse name: Not on file   Number of children: 2   Years of education: Not on file   Highest education level: Not on file  Occupational History    Early retirement  Social Needs   Financial resource strain: Not on file   Food insecurity    Worry: Not on file    Inability: Not on file   Transportation needs    Medical: Not on file    Non-medical: Not on file  Tobacco Use   Smoking status: Current Every Day Smoker   Smokeless tobacco: Never Used  Substance and Sexual Activity    Alcohol use:  Once a year   Drug use: No   Current Outpatient Medications on File Prior to Visit  Medication Sig Dispense Refill   Acetylcysteine (NAC 600 PO) Take by mouth.     amLODipine (NORVASC) 5 MG tablet TAKE 1 TABLET EVERY DAY AT LUNCH     Continuous Blood Gluc Sensor (FREESTYLE LIBRE 3 SENSOR) MISC 1 each by Does not apply route every 14 (fourteen) days. 6 each 3   hydrochlorothiazide (MICROZIDE) 12.5 MG capsule Take 12.5 mg by mouth daily as needed. (Patient not taking: Reported on 01/21/2023)  losartan (COZAAR) 100 MG tablet Take 100 mg by mouth daily.     MELATONIN PO Take 20 mg by mouth. Taking 1 tab in the evening and 1 tab before bedtime.     metFORMIN  (GLUCOPHAGE ) 500 MG tablet Take 1 tablet (500 mg total) by mouth 4 (four) times daily. 360 tablet 3   Multiple Vitamins-Minerals (ZINC PO) Take by mouth.     Probiotic Product (PROBIOTIC PO) Take by mouth.     VITAMIN D PO Take by mouth.     ziprasidone (GEODON) 20 MG capsule Take by mouth. (Patient not taking: Reported on 01/21/2023)     No current facility-administered medications on file prior to visit.   Allergies  Allergen Reactions   Atorvastatin Nausea And Vomiting   Empagliflozin Other (See Comments)   Nsaids    Penicillin G Hives   Pravastatin Nausea And Vomiting   Rosuvastatin Calcium     Other Reaction(s): upset stomach   Sulfa Antibiotics    Family History  Problem Relation Age of Onset   Anxiety disorder Mother    Anxiety disorder Maternal Grandmother    Anxiety disorder Daughter   She also has a history of HL and lung cancer in father; heart disease in mother.  PE: BP 120/60   Pulse 81   Ht 6' (1.829 m)   Wt 185 lb (83.9 kg)   SpO2 97%   BMI 25.09 kg/m  Wt Readings from Last 3 Encounters:  08/16/24 185 lb (83.9 kg)  04/02/24 178 lb (80.7 kg)  10/28/23 169 lb 3.2 oz (76.7 kg)   Constitutional: Normal weight, in NAD Eyes: no exophthalmos ENT: no masses palpated in neck, no cervical  lymphadenopathy Cardiovascular: RRR, No MRG Respiratory: CTA B Musculoskeletal: no deformities Skin: no rashes Neurological: no tremor with outstretched hands  ASSESSMENT: 1. DM2, insulin-dependent, uncontrolled, without long-term complications, but with hypo and hyperglycemia In 2020, I received a message from Lamar Kendall, PhD, her counselor, letting me know about the patient's anxiety/depression and the fact that she may overcorrect low blood sugars or eat a lot of sweets to try to prevent blood sugar crashes.  2. HL  PLAN:  1. Patient with longstanding, type 2 diabetes, on metformin  only.  In the past, she was on insulin but she was not eating much and lost a significant amount of weight and insulin was stopped due to overuse.  HbA1c decreased to a nadir of 5.8%, but afterwards started to increase.  At last visit, this was higher, at 7.1%.  I at that time, sugars were fluctuating in the upper half of the target range with hyperglycemic excursions after her midday and evening meals.  We need to try to avoid medications with weight loss or appetite loss as a side effect due to previously losing too much weight.  I suggested to try to increase metformin  with dinner to 2 tablets and continue 1 tablet with breakfast. We discussed about trying to improve diet but this is difficult due to the patient's psychiatric condition. CGM interpretation: -At today's visit, we reviewed her CGM downloads: It appears that 95% of values are in target range (goal >70%), while 5% are higher than 180 (goal <25%), and 0% are lower than 70 (goal <4%).  The calculated average blood sugar is 132.  The projected HbA1c for the next 3 months (GMI) is 6.5%. -Reviewing the CGM trends, sugars appear to fluctuate within the target range with only mild occasional hyperglycemic exceptions.  For now, I would not  have suggested a change in regimen but she does mention occasional diarrhea and gas.  Upon questioning, she is taking  metformin  before meals and we discussed about taking it after meals.  She would not want to change to metformin  ER for now, but I did discuss with the patient and her husband to let me know if her symptoms do not resolve after changing metformin  to be taken after meals and we can switch to the XR formulation afterwards. - I suggested to:  Patient Instructions  Please continue: - Metformin  500 mg 4x a day but take it after meals  Let me know if you still have an upset stomach after this change.  Try to increase fish oil to 2000 mg daily.  Please return in 4 months.   - we checked her HbA1c: 6.6% (slightly higher) - advised to check sugars at different times of the day - 4x a day, rotating check times - advised for yearly eye exams >> she is UTD - return to clinic in 3-4 months  2. HL -Reviewed latest lipid panel from 03/2022: LDL above target, otherwise fractions at goal: Lab Results  Component Value Date   CHOL 231 (H) 10/28/2023   HDL 54 10/28/2023   LDLCALC 140 (H) 10/28/2023   TRIG 231 (H) 10/28/2023   CHOLHDL 4.3 10/28/2023  -She was previously on a statin, pravastatin, but she developed stomach discomfort and stopped.  She tried Vascepa but developed GI symptoms and stopped this medication, also. - She was previously drinking a lot of milk, reduced from 1.5 to 0.5 gallons a week but I did advise her to stop completely - At last visit I recommended Lipitor 20 mg daily.  Husband did not start her on this due to the fact that he read that this can have a risk of dementia.  She also had nausea and vomiting from atorvastatin in the past.  However, she started ezetimibe 10 mg since last visit. - At today's visit, they are reticent to retry statins but agreed to increase her fish oil to 2000 mg daily.  Lela Fendt, MD PhD [facility] Endocrinology

## 2024-08-16 NOTE — Patient Instructions (Addendum)
 Please continue: - Metformin  500 mg 4x a day but take it after meals  Let me know if you still have an upset stomach after this change.  Try to increase fish oil to 2000 mg daily.  Please return in 4 months.

## 2024-08-18 DIAGNOSIS — F411 Generalized anxiety disorder: Secondary | ICD-10-CM | POA: Diagnosis not present

## 2024-08-18 DIAGNOSIS — F332 Major depressive disorder, recurrent severe without psychotic features: Secondary | ICD-10-CM | POA: Diagnosis not present

## 2024-08-18 DIAGNOSIS — F4312 Post-traumatic stress disorder, chronic: Secondary | ICD-10-CM | POA: Diagnosis not present

## 2024-12-20 ENCOUNTER — Ambulatory Visit: Admitting: Internal Medicine

## 2025-01-07 ENCOUNTER — Ambulatory Visit: Admitting: Internal Medicine
